# Patient Record
Sex: Female | Born: 1950 | Race: White | Hispanic: No | Marital: Married | Smoking: Never smoker
Health system: Southern US, Community
[De-identification: ages and names within clinical notes are randomized; demographics above are authoritative.]

## PROBLEM LIST (undated history)

## (undated) DIAGNOSIS — I10 Essential (primary) hypertension: Secondary | ICD-10-CM

## (undated) DIAGNOSIS — I499 Cardiac arrhythmia, unspecified: Secondary | ICD-10-CM

## (undated) DIAGNOSIS — E785 Hyperlipidemia, unspecified: Secondary | ICD-10-CM

## (undated) DIAGNOSIS — E663 Overweight: Secondary | ICD-10-CM

## (undated) DIAGNOSIS — Z87442 Personal history of urinary calculi: Secondary | ICD-10-CM

## (undated) DIAGNOSIS — B009 Herpesviral infection, unspecified: Secondary | ICD-10-CM

## (undated) DIAGNOSIS — E039 Hypothyroidism, unspecified: Secondary | ICD-10-CM

## (undated) DIAGNOSIS — N61 Mastitis without abscess: Secondary | ICD-10-CM

## (undated) DIAGNOSIS — F329 Major depressive disorder, single episode, unspecified: Secondary | ICD-10-CM

## (undated) DIAGNOSIS — J309 Allergic rhinitis, unspecified: Secondary | ICD-10-CM

## (undated) DIAGNOSIS — K759 Inflammatory liver disease, unspecified: Secondary | ICD-10-CM

## (undated) DIAGNOSIS — K219 Gastro-esophageal reflux disease without esophagitis: Secondary | ICD-10-CM

## (undated) HISTORY — PX: DILATION AND CURETTAGE OF UTERUS: SHX78

## (undated) HISTORY — PX: CARPAL TUNNEL RELEASE: SHX101

## (undated) HISTORY — PX: JOINT REPLACEMENT: SHX530

## (undated) HISTORY — PX: TUBAL LIGATION: SHX77

## (undated) HISTORY — DX: Herpesviral infection, unspecified: B00.9

## (undated) HISTORY — PX: TONSILLECTOMY: SUR1361

## (undated) HISTORY — DX: Major depressive disorder, single episode, unspecified: F32.9

## (undated) HISTORY — DX: Essential (primary) hypertension: I10

## (undated) HISTORY — DX: Overweight: E66.3

## (undated) HISTORY — DX: Allergic rhinitis, unspecified: J30.9

## (undated) HISTORY — DX: Mastitis without abscess: N61.0

## (undated) HISTORY — DX: Hyperlipidemia, unspecified: E78.5

---

## 1999-04-11 ENCOUNTER — Other Ambulatory Visit: Admission: RE | Admit: 1999-04-11 | Discharge: 1999-04-11 | Payer: Self-pay | Admitting: Family Medicine

## 2000-05-07 ENCOUNTER — Other Ambulatory Visit: Admission: RE | Admit: 2000-05-07 | Discharge: 2000-05-07 | Payer: Self-pay | Admitting: Family Medicine

## 2001-04-25 ENCOUNTER — Other Ambulatory Visit: Admission: RE | Admit: 2001-04-25 | Discharge: 2001-04-25 | Payer: Self-pay | Admitting: Family Medicine

## 2001-08-06 ENCOUNTER — Other Ambulatory Visit: Admission: RE | Admit: 2001-08-06 | Discharge: 2001-08-06 | Payer: Self-pay | Admitting: Family Medicine

## 2001-09-05 ENCOUNTER — Other Ambulatory Visit: Admission: RE | Admit: 2001-09-05 | Discharge: 2001-09-05 | Payer: Self-pay | Admitting: Obstetrics and Gynecology

## 2002-07-25 ENCOUNTER — Other Ambulatory Visit: Admission: RE | Admit: 2002-07-25 | Discharge: 2002-07-25 | Payer: Self-pay | Admitting: Family Medicine

## 2003-08-14 ENCOUNTER — Other Ambulatory Visit: Admission: RE | Admit: 2003-08-14 | Discharge: 2003-08-14 | Payer: Self-pay | Admitting: Family Medicine

## 2003-12-03 ENCOUNTER — Ambulatory Visit (HOSPITAL_COMMUNITY): Admission: RE | Admit: 2003-12-03 | Discharge: 2003-12-03 | Payer: Self-pay | Admitting: Specialist

## 2004-05-13 ENCOUNTER — Emergency Department (HOSPITAL_COMMUNITY): Admission: EM | Admit: 2004-05-13 | Discharge: 2004-05-13 | Payer: Self-pay | Admitting: Family Medicine

## 2004-08-02 ENCOUNTER — Ambulatory Visit: Payer: Self-pay | Admitting: Family Medicine

## 2004-08-09 ENCOUNTER — Other Ambulatory Visit: Admission: RE | Admit: 2004-08-09 | Discharge: 2004-08-09 | Payer: Self-pay | Admitting: Family Medicine

## 2004-08-09 ENCOUNTER — Ambulatory Visit: Payer: Self-pay | Admitting: Family Medicine

## 2004-11-02 ENCOUNTER — Ambulatory Visit: Payer: Self-pay | Admitting: Family Medicine

## 2005-01-16 ENCOUNTER — Ambulatory Visit: Payer: Self-pay | Admitting: Family Medicine

## 2005-06-08 ENCOUNTER — Emergency Department (HOSPITAL_COMMUNITY): Admission: EM | Admit: 2005-06-08 | Discharge: 2005-06-08 | Payer: Self-pay | Admitting: Emergency Medicine

## 2005-10-06 ENCOUNTER — Ambulatory Visit: Payer: Self-pay | Admitting: Family Medicine

## 2005-10-24 ENCOUNTER — Encounter: Payer: Self-pay | Admitting: Family Medicine

## 2005-10-24 ENCOUNTER — Ambulatory Visit: Payer: Self-pay | Admitting: Family Medicine

## 2005-10-24 ENCOUNTER — Other Ambulatory Visit: Admission: RE | Admit: 2005-10-24 | Discharge: 2005-10-24 | Payer: Self-pay | Admitting: Family Medicine

## 2005-12-28 ENCOUNTER — Ambulatory Visit (HOSPITAL_BASED_OUTPATIENT_CLINIC_OR_DEPARTMENT_OTHER): Admission: RE | Admit: 2005-12-28 | Discharge: 2005-12-28 | Payer: Self-pay | Admitting: Specialist

## 2006-03-09 ENCOUNTER — Ambulatory Visit: Payer: Self-pay | Admitting: Family Medicine

## 2006-05-28 ENCOUNTER — Emergency Department (HOSPITAL_COMMUNITY): Admission: EM | Admit: 2006-05-28 | Discharge: 2006-05-28 | Payer: Self-pay | Admitting: Emergency Medicine

## 2006-06-08 ENCOUNTER — Emergency Department (HOSPITAL_COMMUNITY): Admission: EM | Admit: 2006-06-08 | Discharge: 2006-06-08 | Payer: Self-pay | Admitting: Family Medicine

## 2006-07-06 ENCOUNTER — Ambulatory Visit: Payer: Self-pay | Admitting: Family Medicine

## 2006-08-06 ENCOUNTER — Ambulatory Visit: Payer: Self-pay | Admitting: Family Medicine

## 2006-08-27 ENCOUNTER — Encounter: Payer: Self-pay | Admitting: Family Medicine

## 2006-08-28 DIAGNOSIS — I10 Essential (primary) hypertension: Secondary | ICD-10-CM | POA: Insufficient documentation

## 2006-08-28 DIAGNOSIS — F329 Major depressive disorder, single episode, unspecified: Secondary | ICD-10-CM | POA: Insufficient documentation

## 2006-08-28 DIAGNOSIS — F3289 Other specified depressive episodes: Secondary | ICD-10-CM

## 2006-08-28 HISTORY — DX: Other specified depressive episodes: F32.89

## 2006-08-28 HISTORY — DX: Major depressive disorder, single episode, unspecified: F32.9

## 2006-08-28 HISTORY — DX: Essential (primary) hypertension: I10

## 2006-09-01 ENCOUNTER — Ambulatory Visit: Payer: Self-pay | Admitting: Internal Medicine

## 2006-09-03 ENCOUNTER — Ambulatory Visit: Payer: Self-pay | Admitting: Family Medicine

## 2006-09-03 DIAGNOSIS — N10 Acute tubulo-interstitial nephritis: Secondary | ICD-10-CM | POA: Insufficient documentation

## 2006-12-03 ENCOUNTER — Telehealth: Payer: Self-pay | Admitting: Family Medicine

## 2007-02-27 ENCOUNTER — Telehealth: Payer: Self-pay | Admitting: Family Medicine

## 2007-03-05 ENCOUNTER — Encounter: Payer: Self-pay | Admitting: Family Medicine

## 2007-03-19 ENCOUNTER — Telehealth: Payer: Self-pay | Admitting: Family Medicine

## 2007-04-25 ENCOUNTER — Telehealth: Payer: Self-pay | Admitting: Family Medicine

## 2007-05-21 ENCOUNTER — Telehealth: Payer: Self-pay | Admitting: Family Medicine

## 2007-08-22 ENCOUNTER — Telehealth (INDEPENDENT_AMBULATORY_CARE_PROVIDER_SITE_OTHER): Payer: Self-pay | Admitting: *Deleted

## 2007-09-04 ENCOUNTER — Encounter: Payer: Self-pay | Admitting: Family Medicine

## 2007-11-13 ENCOUNTER — Telehealth: Payer: Self-pay | Admitting: *Deleted

## 2007-12-02 ENCOUNTER — Telehealth: Payer: Self-pay | Admitting: Family Medicine

## 2007-12-16 ENCOUNTER — Ambulatory Visit: Payer: Self-pay | Admitting: Family Medicine

## 2007-12-16 LAB — CONVERTED CEMR LAB
ALT: 26 units/L (ref 0–35)
AST: 24 units/L (ref 0–37)
Albumin: 3.9 g/dL (ref 3.5–5.2)
Alkaline Phosphatase: 104 units/L (ref 39–117)
BUN: 11 mg/dL (ref 6–23)
Basophils Absolute: 0.2 10*3/uL — ABNORMAL HIGH (ref 0.0–0.1)
Basophils Relative: 2.6 % (ref 0.0–3.0)
Bilirubin Urine: NEGATIVE
Bilirubin, Direct: 0.1 mg/dL (ref 0.0–0.3)
Blood in Urine, dipstick: NEGATIVE
CO2: 34 meq/L — ABNORMAL HIGH (ref 19–32)
Calcium: 9.6 mg/dL (ref 8.4–10.5)
Chloride: 103 meq/L (ref 96–112)
Cholesterol: 167 mg/dL (ref 0–200)
Creatinine, Ser: 1 mg/dL (ref 0.4–1.2)
Eosinophils Absolute: 0.2 10*3/uL (ref 0.0–0.7)
Eosinophils Relative: 2.9 % (ref 0.0–5.0)
GFR calc Af Amer: 73 mL/min
GFR calc non Af Amer: 61 mL/min
Glucose, Bld: 99 mg/dL (ref 70–99)
Glucose, Urine, Semiquant: NEGATIVE
HCT: 38.3 % (ref 36.0–46.0)
HDL: 48.4 mg/dL (ref >39.0)
Hemoglobin: 13.2 g/dL (ref 12.0–15.0)
Ketones, urine, test strip: NEGATIVE
LDL Cholesterol: 90 mg/dL (ref 0–99)
Lymphocytes Relative: 25.3 % (ref 12.0–46.0)
MCHC: 34.4 g/dL (ref 30.0–36.0)
MCV: 87.1 fL (ref 78.0–100.0)
Monocytes Absolute: 0.4 10*3/uL (ref 0.1–1.0)
Monocytes Relative: 6 % (ref 3.0–12.0)
Neutro Abs: 4.2 10*3/uL (ref 1.4–7.7)
Neutrophils Relative %: 63.2 % (ref 43.0–77.0)
Nitrite: NEGATIVE
Platelets: 275 10*3/uL (ref 150–400)
Potassium: 3.6 meq/L (ref 3.5–5.1)
Protein, U semiquant: NEGATIVE
RBC: 4.4 M/uL (ref 3.87–5.11)
RDW: 13.5 % (ref 11.5–14.6)
Sodium: 146 meq/L — ABNORMAL HIGH (ref 135–145)
Specific Gravity, Urine: 1.015
TSH: 1.41 microintl units/mL (ref 0.35–5.50)
Total Bilirubin: 0.8 mg/dL (ref 0.3–1.2)
Total CHOL/HDL Ratio: 3.5
Total Protein: 7.2 g/dL (ref 6.0–8.3)
Triglycerides: 143 mg/dL (ref 0–149)
Urobilinogen, UA: 0.2
VLDL: 29 mg/dL (ref 0–40)
WBC Urine, dipstick: NEGATIVE
WBC: 6.7 10*3/uL (ref 4.5–10.5)
pH: 8.5

## 2007-12-23 ENCOUNTER — Other Ambulatory Visit: Admission: RE | Admit: 2007-12-23 | Discharge: 2007-12-23 | Payer: Self-pay | Admitting: Family Medicine

## 2007-12-23 ENCOUNTER — Encounter: Payer: Self-pay | Admitting: Family Medicine

## 2007-12-23 ENCOUNTER — Ambulatory Visit: Payer: Self-pay | Admitting: Family Medicine

## 2007-12-23 DIAGNOSIS — E782 Mixed hyperlipidemia: Secondary | ICD-10-CM | POA: Insufficient documentation

## 2007-12-23 DIAGNOSIS — E785 Hyperlipidemia, unspecified: Secondary | ICD-10-CM

## 2007-12-23 HISTORY — DX: Hyperlipidemia, unspecified: E78.5

## 2008-01-02 ENCOUNTER — Ambulatory Visit: Payer: Self-pay | Admitting: Family Medicine

## 2008-01-20 ENCOUNTER — Ambulatory Visit: Payer: Self-pay | Admitting: Family Medicine

## 2008-02-17 ENCOUNTER — Ambulatory Visit: Payer: Self-pay | Admitting: Family Medicine

## 2008-08-01 ENCOUNTER — Emergency Department (HOSPITAL_COMMUNITY): Admission: EM | Admit: 2008-08-01 | Discharge: 2008-08-01 | Payer: Self-pay | Admitting: Emergency Medicine

## 2008-11-11 ENCOUNTER — Encounter: Payer: Self-pay | Admitting: Family Medicine

## 2008-12-29 ENCOUNTER — Telehealth: Payer: Self-pay | Admitting: Family Medicine

## 2009-01-05 ENCOUNTER — Ambulatory Visit: Payer: Self-pay | Admitting: Family Medicine

## 2009-01-05 LAB — CONVERTED CEMR LAB
ALT: 26 units/L (ref 0–35)
AST: 25 units/L (ref 0–37)
Albumin: 3.9 g/dL (ref 3.5–5.2)
Alkaline Phosphatase: 78 units/L (ref 39–117)
BUN: 9 mg/dL (ref 6–23)
Basophils Absolute: 0 10*3/uL (ref 0.0–0.1)
Basophils Relative: 0.6 % (ref 0.0–3.0)
Bilirubin Urine: NEGATIVE
Bilirubin, Direct: 0.1 mg/dL (ref 0.0–0.3)
CO2: 31 meq/L (ref 19–32)
Calcium: 9 mg/dL (ref 8.4–10.5)
Chloride: 105 meq/L (ref 96–112)
Cholesterol: 162 mg/dL (ref 0–200)
Creatinine, Ser: 0.9 mg/dL (ref 0.4–1.2)
Eosinophils Absolute: 0.2 10*3/uL (ref 0.0–0.7)
Eosinophils Relative: 2.5 % (ref 0.0–5.0)
GFR calc non Af Amer: 68.08 mL/min (ref >60)
Glucose, Bld: 93 mg/dL (ref 70–99)
Glucose, Urine, Semiquant: NEGATIVE
HCT: 40 % (ref 36.0–46.0)
HDL: 48.2 mg/dL (ref >39.00)
Hemoglobin: 13.4 g/dL (ref 12.0–15.0)
Ketones, urine, test strip: NEGATIVE
LDL Cholesterol: 90 mg/dL (ref 0–99)
Lymphocytes Relative: 27.9 % (ref 12.0–46.0)
Lymphs Abs: 1.8 10*3/uL (ref 0.7–4.0)
MCHC: 33.5 g/dL (ref 30.0–36.0)
MCV: 89.1 fL (ref 78.0–100.0)
Monocytes Absolute: 0.4 10*3/uL (ref 0.1–1.0)
Monocytes Relative: 6.5 % (ref 3.0–12.0)
Neutro Abs: 4.2 10*3/uL (ref 1.4–7.7)
Neutrophils Relative %: 62.5 % (ref 43.0–77.0)
Nitrite: NEGATIVE
Platelets: 243 10*3/uL (ref 150.0–400.0)
Potassium: 3.3 meq/L — ABNORMAL LOW (ref 3.5–5.1)
Protein, U semiquant: NEGATIVE
RBC: 4.49 M/uL (ref 3.87–5.11)
RDW: 13.6 % (ref 11.5–14.6)
Sodium: 143 meq/L (ref 135–145)
Specific Gravity, Urine: 1.015
TSH: 1.26 microintl units/mL (ref 0.35–5.50)
Total Bilirubin: 0.8 mg/dL (ref 0.3–1.2)
Total CHOL/HDL Ratio: 3
Total Protein: 6.9 g/dL (ref 6.0–8.3)
Triglycerides: 118 mg/dL (ref 0.0–149.0)
Urobilinogen, UA: 0.2
VLDL: 23.6 mg/dL (ref 0.0–40.0)
WBC Urine, dipstick: NEGATIVE
WBC: 6.6 10*3/uL (ref 4.5–10.5)
pH: 7

## 2009-01-12 ENCOUNTER — Other Ambulatory Visit: Admission: RE | Admit: 2009-01-12 | Discharge: 2009-01-12 | Payer: Self-pay | Admitting: Family Medicine

## 2009-01-12 ENCOUNTER — Ambulatory Visit: Payer: Self-pay | Admitting: Family Medicine

## 2009-01-12 DIAGNOSIS — M545 Low back pain, unspecified: Secondary | ICD-10-CM | POA: Insufficient documentation

## 2009-01-12 DIAGNOSIS — E663 Overweight: Secondary | ICD-10-CM | POA: Insufficient documentation

## 2009-01-12 HISTORY — DX: Overweight: E66.3

## 2009-01-20 ENCOUNTER — Encounter (INDEPENDENT_AMBULATORY_CARE_PROVIDER_SITE_OTHER): Payer: Self-pay | Admitting: *Deleted

## 2009-01-22 ENCOUNTER — Ambulatory Visit: Payer: Self-pay | Admitting: Gastroenterology

## 2009-02-01 LAB — HM COLONOSCOPY

## 2009-02-02 ENCOUNTER — Ambulatory Visit: Payer: Self-pay | Admitting: Gastroenterology

## 2009-02-02 LAB — HM MAMMOGRAPHY: HM Mammogram: NORMAL

## 2009-06-09 ENCOUNTER — Telehealth: Payer: Self-pay | Admitting: Family Medicine

## 2009-06-21 ENCOUNTER — Ambulatory Visit: Payer: Self-pay | Admitting: Family Medicine

## 2009-06-21 DIAGNOSIS — R42 Dizziness and giddiness: Secondary | ICD-10-CM | POA: Insufficient documentation

## 2009-07-01 ENCOUNTER — Ambulatory Visit: Payer: Self-pay | Admitting: Family Medicine

## 2009-07-01 DIAGNOSIS — B009 Herpesviral infection, unspecified: Secondary | ICD-10-CM

## 2009-07-01 HISTORY — DX: Herpesviral infection, unspecified: B00.9

## 2009-09-20 ENCOUNTER — Telehealth: Payer: Self-pay | Admitting: Family Medicine

## 2009-10-04 DIAGNOSIS — N61 Mastitis without abscess: Secondary | ICD-10-CM | POA: Insufficient documentation

## 2009-10-04 HISTORY — DX: Mastitis without abscess: N61.0

## 2009-10-07 ENCOUNTER — Ambulatory Visit: Payer: Self-pay | Admitting: Family Medicine

## 2009-11-18 ENCOUNTER — Encounter: Payer: Self-pay | Admitting: Family Medicine

## 2010-01-10 ENCOUNTER — Ambulatory Visit: Payer: Self-pay | Admitting: Family Medicine

## 2010-01-10 LAB — CONVERTED CEMR LAB
ALT: 33 units/L (ref 0–35)
AST: 27 units/L (ref 0–37)
Albumin: 4.2 g/dL (ref 3.5–5.2)
Alkaline Phosphatase: 80 units/L (ref 39–117)
BUN: 15 mg/dL (ref 6–23)
Basophils Absolute: 0 10*3/uL (ref 0.0–0.1)
Basophils Relative: 0.7 % (ref 0.0–3.0)
Bilirubin Urine: NEGATIVE
Bilirubin, Direct: 0.1 mg/dL (ref 0.0–0.3)
CO2: 32 meq/L (ref 19–32)
Calcium: 9.8 mg/dL (ref 8.4–10.5)
Chloride: 103 meq/L (ref 96–112)
Cholesterol: 177 mg/dL (ref 0–200)
Creatinine, Ser: 1 mg/dL (ref 0.4–1.2)
Eosinophils Absolute: 0.2 10*3/uL (ref 0.0–0.7)
Eosinophils Relative: 3.1 % (ref 0.0–5.0)
GFR calc non Af Amer: 58.72 mL/min (ref >60)
Glucose, Bld: 103 mg/dL — ABNORMAL HIGH (ref 70–99)
Glucose, Urine, Semiquant: NEGATIVE
HCT: 39.2 % (ref 36.0–46.0)
HDL: 55.1 mg/dL (ref >39.00)
Hemoglobin: 13.2 g/dL (ref 12.0–15.0)
LDL Cholesterol: 97 mg/dL (ref 0–99)
Lymphocytes Relative: 32.8 % (ref 12.0–46.0)
Lymphs Abs: 2.2 10*3/uL (ref 0.7–4.0)
MCHC: 33.8 g/dL (ref 30.0–36.0)
MCV: 88.4 fL (ref 78.0–100.0)
Monocytes Absolute: 0.5 10*3/uL (ref 0.1–1.0)
Monocytes Relative: 6.8 % (ref 3.0–12.0)
Neutro Abs: 3.9 10*3/uL (ref 1.4–7.7)
Neutrophils Relative %: 56.6 % (ref 43.0–77.0)
Nitrite: NEGATIVE
Platelets: 255 10*3/uL (ref 150.0–400.0)
Potassium: 4.1 meq/L (ref 3.5–5.1)
RBC: 4.43 M/uL (ref 3.87–5.11)
RDW: 15 % — ABNORMAL HIGH (ref 11.5–14.6)
Sodium: 143 meq/L (ref 135–145)
Specific Gravity, Urine: 1.02
TSH: 1.91 microintl units/mL (ref 0.35–5.50)
Total Bilirubin: 0.3 mg/dL (ref 0.3–1.2)
Total CHOL/HDL Ratio: 3
Total Protein: 7.1 g/dL (ref 6.0–8.3)
Triglycerides: 123 mg/dL (ref 0.0–149.0)
Urobilinogen, UA: 0.2
VLDL: 24.6 mg/dL (ref 0.0–40.0)
WBC: 6.9 10*3/uL (ref 4.5–10.5)

## 2010-01-17 ENCOUNTER — Encounter: Payer: Self-pay | Admitting: Family Medicine

## 2010-01-17 ENCOUNTER — Other Ambulatory Visit
Admission: RE | Admit: 2010-01-17 | Discharge: 2010-01-17 | Payer: Self-pay | Source: Home / Self Care | Admitting: Family Medicine

## 2010-01-17 ENCOUNTER — Ambulatory Visit: Payer: Self-pay | Admitting: Family Medicine

## 2010-01-17 LAB — CONVERTED CEMR LAB: Pap Smear: NEGATIVE

## 2010-01-25 ENCOUNTER — Ambulatory Visit: Payer: Self-pay | Admitting: Internal Medicine

## 2010-01-25 ENCOUNTER — Encounter: Payer: Self-pay | Admitting: Family Medicine

## 2010-03-13 ENCOUNTER — Emergency Department (HOSPITAL_COMMUNITY)
Admission: EM | Admit: 2010-03-13 | Discharge: 2010-03-13 | Payer: Self-pay | Source: Home / Self Care | Admitting: Emergency Medicine

## 2010-03-13 LAB — POCT I-STAT, CHEM 8
BUN: 11 mg/dL (ref 6–23)
Calcium, Ion: 1.15 mmol/L (ref 1.12–1.32)
Chloride: 102 mEq/L (ref 96–112)
Creatinine, Ser: 1.2 mg/dL (ref 0.4–1.2)
Glucose, Bld: 110 mg/dL — ABNORMAL HIGH (ref 70–99)
HCT: 45 % (ref 36.0–46.0)
Hemoglobin: 15.3 g/dL — ABNORMAL HIGH (ref 12.0–15.0)
Potassium: 3.9 mEq/L (ref 3.5–5.1)
Sodium: 143 mEq/L (ref 135–145)
TCO2: 32 mmol/L (ref 0–100)

## 2010-03-14 ENCOUNTER — Telehealth: Payer: Self-pay | Admitting: Family Medicine

## 2010-03-14 ENCOUNTER — Other Ambulatory Visit: Payer: Self-pay | Admitting: Family Medicine

## 2010-03-14 ENCOUNTER — Ambulatory Visit
Admission: RE | Admit: 2010-03-14 | Discharge: 2010-03-14 | Payer: Self-pay | Source: Home / Self Care | Attending: Family Medicine | Admitting: Family Medicine

## 2010-03-14 LAB — BASIC METABOLIC PANEL
BUN: 16 mg/dL (ref 6–23)
CO2: 33 mEq/L — ABNORMAL HIGH (ref 19–32)
Calcium: 9.5 mg/dL (ref 8.4–10.5)
Chloride: 99 mEq/L (ref 96–112)
Creatinine, Ser: 1 mg/dL (ref 0.4–1.2)
GFR: 58.03 mL/min — ABNORMAL LOW (ref >60.00)
Glucose, Bld: 94 mg/dL (ref 70–99)
Potassium: 4.3 mEq/L (ref 3.5–5.1)
Sodium: 140 mEq/L (ref 135–145)

## 2010-03-15 NOTE — Letter (Signed)
Summary: Sierra Banks Instructions  [facility]  [address] [place], [place] [address]   Phone: [phone]  Fax: [phone]       Sierra Banks    [birth year]    MRN: [id number]        Procedure Day Dorna Bloom:  Jake Shark  02/02/09     Arrival Time:  9:30AM     Procedure Time:  10:30AM     Location of Procedure:                    Juliann Pares _  [facility] (4th Floor)   PREPARATION FOR COLONOSCOPY WITH MOVIPREP   Starting 5 days prior to your procedure12/16/10 do not eat nuts, seeds, popcorn, corn, beans, peas,  salads, or any raw vegetables.  Do not take any fiber supplements (e.g. Metamucil, Citrucel, and Benefiber).  THE DAY BEFORE YOUR PROCEDURE         DATE: 12/61/10  DAY: MONDAY  1.  Drink clear liquids the entire day-NO SOLID FOOD  2.  Do not drink anything colored red or purple.  Avoid juices with pulp.  No orange juice.  3.  Drink at least 64 oz. (8 glasses) of fluid/clear liquids during the day to prevent dehydration and help the prep work efficiently.  CLEAR LIQUIDS INCLUDE: Water Jello Ice Popsicles Tea (sugar ok, no milk/cream) Powdered fruit flavored drinks Coffee (sugar ok, no milk/cream) Gatorade Juice: apple, white grape, white cranberry  Lemonade Clear bullion, consomm, broth Carbonated beverages (any kind) Strained chicken noodle soup Hard Candy                             4.  In the morning, mix first dose of MoviPrep solution:    Empty 1 Pouch A and 1 Pouch B into the disposable container    Add lukewarm drinking water to the top line of the container. Mix to dissolve    Refrigerate (mixed solution should be used within 24 hrs)  5.  Begin drinking the prep at 5:00 p.m. The MoviPrep container is divided by 4 marks.   Every 15 minutes drink the solution down to the next mark (approximately 8 oz) until the full liter is complete.   6.  Follow completed prep with 16 oz of clear liquid of your choice (Nothing red or purple).   Continue to drink clear liquids until bedtime.  7.  Before going to bed, mix second dose of MoviPrep solution:    Empty 1 Pouch A and 1 Pouch B into the disposable container    Add lukewarm drinking water to the top line of the container. Mix to dissolve    Refrigerate  THE DAY OF YOUR PROCEDURE      DATE:12/61/10 DAY: TUESDAY  Beginning at 5:30a.m. (5 hours before procedure):         1. Every 15 minutes, drink the solution down to the next mark (approx 8 oz) until the full liter is complete.  2. Follow completed prep with 16 oz. of clear liquid of your choice.    3. You may drink clear liquids until 8:30AM (2 HOURS BEFORE PROCEDURE).   MEDICATION INSTRUCTIONS  Unless otherwise instructed, you should take regular prescription medications with a small sip of water   as early as possible the morning of your procedure.  Additional medication instructions:  Do not take [place] the day of the procedure.  OTHER INSTRUCTIONS  You will need a responsible adult at least 61 years of age to accompany you and drive you home.   This person must remain in the waiting room during your procedure.  Wear loose fitting clothing that is easily removed.  Leave jewelry and other valuables at home.  However, you may wish to bring a book to read or  an iPod/MP3 player to listen to music as you wait for your procedure to start.  Remove all body piercing jewelry and leave at home.  Total time from sign-in until discharge is approximately 2-3 hours.  You should go home directly after your procedure and rest.  You can resume normal activities the  day after your procedure.  The day of your procedure you should not:   Drive   Make legal decisions   Operate machinery   Drink alcohol   Return to work  You will receive specific instructions about eating, activities and medications before you leave.    The above instructions have been reviewed and explained to me by   Ezra Sites RN  January 23, 2060 2:20 PM     I fully understand and can verbalize these instructions _____________________________ Date _________

## 2010-03-15 NOTE — Progress Notes (Signed)
Summary: Effexor   Phone Note Call from Patient   Caller: Patient Call For: Dr. Tawanna Cooler Summary of Call: Pt. would like to increase her Effexor 150 mg. if you would agree. [facility] [address] [phone] Initial call taken by: Lynann Beaver CMA,  March 19, 2007 1:33 PM  Follow-up for Phone Call        please call in the generic, Effexor, 50 mg one q.a.m., which she will take along with her Effexor 150XR q.a.m. call in a hundred tablets.  Refill is x 3.  [facility] [address] Follow-up by: Roderick Pee MD,  March 19, 2007 2:48 PM  Additional Follow-up for Phone Call Additional follow up Details #1::        Phone Call Completed, Rx Called In, Provider Notified Additional Follow-up by: Arcola Jansky, RN,  March 19, 2007 5:37 PM

## 2010-03-15 NOTE — Progress Notes (Signed)
Summary: effexor refill  Phone Note Call from Patient   Summary of Call: patient is requesting a refill of effexor last office visit was 1/10.  is this okay to fill? Initial call taken by: Kern Reap CMA Duncan Dull),  December 29, 2008 9:47 AM  Follow-up for Phone Call        ok.......dispense enough until next physical Follow-up by: Roderick Pee MD,  December 29, 2008 10:07 AM  Additional Follow-up for Phone Call Additional follow up Details #1::        Rx Called In no refills.  left message on machine for patient to schedule a cpx. Additional Follow-up by: Kern Reap CMA Duncan Dull),  December 29, 2008 4:51 PM

## 2010-03-15 NOTE — Miscellaneous (Signed)
  Medications Added LISINOPRIL 10 MG TABS (LISINOPRIL) 1 once daily       Clinical Lists Changes  Medications: Changed medication from LISINOPRIL 10 MG TABS (LISINOPRIL) to LISINOPRIL 10 MG TABS (LISINOPRIL) 1 once daily

## 2010-03-15 NOTE — Miscellaneous (Signed)
Summary: mammogram update   Clinical Lists Changes  Observations: Added new observation of MAMMOGRAM: normal (11/17/2009 8:35)      Preventive Care Screening  Mammogram:    Date:  11/17/2009    Results:  normal

## 2010-03-15 NOTE — Assessment & Plan Note (Signed)
Summary: DIZZINESS, NAUSEA (VERTIGO?) // RS   Vital Signs:  Patient profile:   60 year old female Menstrual status:  postmenopausal Weight:      222 pounds Temp:     98.4 degrees F oral BP sitting:   102 / 70  (right arm) CC: Nausea/Dizzy/Vertigo   CC:  Nausea/Dizzy/Vertigo.  History of Present Illness: Sierra Banks is a 60 year old, married female, nonsmoker, who comes in today for evaluation of vertigo.  She states last Friday.  She went to stand up and the room started to spin.  Since that, time it's been constant, although not very intense.  She had vomiting x 1 yesterday, when she stood up, it was so severe it made her nauseated and she threw up.  She said no hearing loss.  Neurologic review of systems negative  Allergies: No Known Drug Allergies  Past History:  Past medical, surgical, family and social histories (including risk factors) reviewed for relevance to current acute and chronic problems.  Past Medical History: Reviewed history from 12/23/2007 and no changes required. DUB Depression Hypertension Hyperlipidemia  Past Surgical History: Reviewed history from 08/28/2006 and no changes required. T&A BTL CB x 2  Family History: Reviewed history and no changes required.  Social History: Reviewed history from 08/28/2006 and no changes required. Married Never Smoked Alcohol use-no Drug use-no  Review of Systems      See HPI  Physical Exam  General:  Well-developed,well-nourished,in no acute distress; alert,appropriate and cooperative throughout examination Head:  Normocephalic and atraumatic without obvious abnormalities. No apparent alopecia or balding. Eyes:  No corneal or conjunctival inflammation noted. EOMI. Perrla. Funduscopic exam benign, without hemorrhages, exudates or papilledema. Vision grossly normal. Ears:  External ear exam shows no significant lesions or deformities.  Otoscopic examination reveals clear canals, tympanic membranes are intact  bilaterally without bulging, retraction, inflammation or discharge. Hearing is grossly normal bilaterally. Nose:  External nasal examination shows no deformity or inflammation. Nasal mucosa are pink and moist without lesions or exudates. Mouth:  Oral mucosa and oropharynx without lesions or exudates.  Teeth in good repair. Neurologic:  No cranial nerve deficits noted. Station and gait are normal. Plantar reflexes are down-going bilaterally. DTRs are symmetrical throughout. Sensory, motor and coordinative functions appear intact.   Problems:  Medical Problems Added: 1)  Dx of Vertigo  (ICD-780.4)  Impression & Recommendations:  Problem # 1:  VERTIGO (ICD-780.4) Assessment New  Complete Medication List: 1)  Effexor Xr 150 Mg Cp24 (Venlafaxine hcl) .... 2 q am 2)  Atenolol 100 Mg Tabs (Atenolol) .Marland Kitchen.. 1 tab qam 3)  Furosemide 40 Mg Tabs (Furosemide) .... Take 1 tablet by mouth every morning ; cpx when due 4)  Zocor 40 Mg Tabs (Simvastatin) .Marland Kitchen.. 1 tab @ bedtime 5)  Lisinopril 40 Mg Tabs (Lisinopril) .... 2 by mouth qam 6)  Flexeril 10 Mg Tabs (Cyclobenzaprine hcl) .Marland Kitchen.. 1 tab @ bedtime  Patient Instructions: 1)  if in two weeks.  The vertigo persists call we would be to setup for an ENT evaluation

## 2010-03-15 NOTE — Assessment & Plan Note (Signed)
Summary: 1 month rov/njr   Vital Signs:  Patient Profile:   60 Years Old Female Height:     64 inches Weight:      214 pounds Temp:     98.6 degrees F oral BP sitting:   150 / 80  (left arm) Cuff size:   regular  Vitals Entered By: Kern Reap CMA (February 17, 2008 9:14 AM)                 Chief Complaint:  follow up with blood pressure.  History of Present Illness: Sierra Banks is a 60 year old female, who comes in today for follow-up of hypertension.  She is on 100 mg a day, atenolol, 60 mg of Lasix, and 60 mg of lisinopril.  Blood pressure is still elevated systolic running around 150.  Today I got 150/84, asymptomatic as far as side effects    Prior Medication List:  EFFEXOR XR 150 MG  CP24 (VENLAFAXINE HCL) 2 q am ATENOLOL 100 MG  TABS (ATENOLOL) 1 tab qam FUROSEMIDE 40 MG  TABS (FUROSEMIDE) Take 1 tablet by mouth every morning ; cpx when due ZOCOR 40 MG TABS (SIMVASTATIN) 1 tab @ bedtime LISINOPRIL 40 MG TABS (LISINOPRIL) 1 &1/2 qam FUROSEMIDE 20 MG TABS (FUROSEMIDE) 1 @noon    Current Allergies: No known allergies   Past Medical History:    Reviewed history from 12/23/2007 and no changes required:       DUB       Depression       Hypertension       Hyperlipidemia   Social History:    Reviewed history from 08/28/2006 and no changes required:       Married       Never Smoked       Alcohol use-no       Drug use-no    Review of Systems      See HPI   Physical Exam  General:     Well-developed,well-nourished,in no acute distress; alert,appropriate and cooperative throughout examination Heart:     150/84    Impression & Recommendations:  Problem # 1:  HYPERTENSION (ICD-401.9) Assessment: Unchanged  Her updated medication list for this problem includes:    Atenolol 100 Mg Tabs (Atenolol) .Marland Kitchen... 1 tab qam    Furosemide 40 Mg Tabs (Furosemide) .Marland Kitchen... Take 1 tablet by mouth every morning ; cpx when due    Lisinopril 40 Mg Tabs (Lisinopril) .Marland Kitchen... 2  by mouth qam    Furosemide 20 Mg Tabs (Furosemide) .Marland Kitchen... 1 @noon    Complete Medication List: 1)  Effexor Xr 150 Mg Cp24 (Venlafaxine hcl) .... 2 q am 2)  Atenolol 100 Mg Tabs (Atenolol) .Marland Kitchen.. 1 tab qam 3)  Furosemide 40 Mg Tabs (Furosemide) .... Take 1 tablet by mouth every morning ; cpx when due 4)  Zocor 40 Mg Tabs (Simvastatin) .Marland Kitchen.. 1 tab @ bedtime 5)  Lisinopril 40 Mg Tabs (Lisinopril) .... 2 by mouth qam 6)  Furosemide 20 Mg Tabs (Furosemide) .Marland Kitchen.. 1 @noon    Patient Instructions: 1)  increase the lisinopril to 80 mg daily.  Check a morning blood pressure daily.  If after 4 weeks.  Her blood pressure is 135/85 or less.  Then we are at goal continued that medication.  If, however, it's not returned for follow-up   Prescriptions: LISINOPRIL 40 MG TABS (LISINOPRIL) 2 by mouth qam  #200 x 3   Entered and Authorized by:   Roderick Pee MD   Signed by:  Roderick Pee MD on 02/17/2008   Method used:   [facility]        [id number]       [address]       [place], [place]  [address]       Ph: [phone] or [phone]       Fax: [phone]   RxID:   [id number]  ]

## 2010-03-15 NOTE — Progress Notes (Signed)
Summary: lipitor time for cpx  Phone Note Refill Request Message from:  Fax from Pharmacy  Refills Requested: Medication #1:  LIPITOR 20 MG TABS one tab once daily  Method Requested: Electronic Initial call taken by: Kern Reap CMA,  December 02, 2007 12:19 PM      Prescriptions: LIPITOR 20 MG TABS (ATORVASTATIN CALCIUM) one tab once daily  #15 x 0   Entered by:   Kern Reap CMA   Authorized by:   Roderick Pee MD   Signed by:   Kern Reap CMA on 12/02/2007   Method used:   Electronically to        [facility] [address] [phone]* (retail)       [address]       [place], [place]  [address]       Ph: [phone] or [phone]       Fax: [phone]   RxID:   [id number]

## 2010-03-15 NOTE — Progress Notes (Signed)
Summary: wants higher dose of Effexor  LMTCB 05/22/2007  Phone Note Call from Patient   Caller: patient live Call For: Tawanna Cooler Summary of Call: Last month Dr Tawanna Cooler upped her dose on Effexor XR to 180mg .  She still feels this is not quite enough.  One minute she is ok and the next she feels down.  cell [phone] until 4 after 4 home [phone].  [facility] [address]  Initial call taken by: Roselle Locus,  May 21, 2007 2:36 PM  Follow-up for Phone Call        increase Effexor to 300 mg daily.  Return to the office to see me in 3 weeks if not happy with how she feels on this new dose Follow-up by: Roderick Pee MD,  May 21, 2007 5:48 PM  Additional Follow-up for Phone Call Additional follow up Details #1::        [facility] Additional Follow-up by: Lynann Beaver CMA,  May 22, 2007 8:15 AM    Additional Follow-up for Phone Call Additional follow up Details #2::    Patient aware.  Needs updated Rx to [facility] [address]  New/Updated Medications: EFFEXOR XR 150 MG  CP24 (VENLAFAXINE HCL) 2 q am   Prescriptions: EFFEXOR XR 150 MG  CP24 (VENLAFAXINE HCL) 2 q am  #200 x 3   Entered by:   Rudy Jew, RN   Authorized by:   Roderick Pee MD   Signed by:   Rudy Jew, RN on 05/22/2007   Method used:   Electronically sent to ...       [facility] [address] [id number]       [address]       [place], [place]  [address]       Ph: [phone] or [phone]       Fax: [phone]   RxID:   [id number]

## 2010-03-15 NOTE — Procedures (Signed)
Summary: Colonoscopy  Patient: Sierra Banks Note: All result statuses are Final unless otherwise noted.  Tests: (1) Colonoscopy (COL)   COL Colonoscopy           DONE     [facility]     [address]     [place], [place]  [address]           COLONOSCOPY PROCEDURE REPORT           PATIENT:  Sierra Banks  MR#:  [id number]     BIRTHDATE:  23-Jun-1950, 58 yrs. old  GENDER:  female           ENDOSCOPIST:  Judie Petit T. Russella Dar, MD, [facility]     Referred by:  Eugenio Hoes Tawanna Cooler, M.D.           PROCEDURE DATE:  02/02/2009     PROCEDURE:  Colonoscopy [address]     ASA CLASS:  Class II     INDICATIONS:  1) Elevated Risk Screening  2) family history of     colon cancer, maternal uncle and maternal GF with colon cancer.           MEDICATIONS:   Fentanyl 100 mcg IV, Versed 9 mg IV           DESCRIPTION OF PROCEDURE:   After the risks benefits and     alternatives of the procedure were thoroughly explained, informed     consent was obtained.  Digital rectal exam was performed and     revealed no abnormalities.   The LB [id number]-[id number] endoscope     was introduced through the anus and advanced to the cecum, which     was identified by both the appendix and ileocecal valve, limited     by a tortuous colon.    The quality of the prep was good, using     MoviPrep.  The instrument was then slowly withdrawn as the colon     was fully examined.     <<PROCEDUREIMAGES>>           FINDINGS:  Mild diverticulosis was found in the sigmoid colon. A     normal appearing cecum, ileocecal valve, and appendiceal orifice     were identified. The ascending, hepatic flexure, transverse,     splenic flexure, descending, and rectum appeared unremarkable.     Retroflexed views in the rectum revealed no abnormalities.  The     time to cecum =  8  minutes. The scope was then withdrawn (time =     10.25  min) from the patient and the procedure completed.           COMPLICATIONS:  None           ENDOSCOPIC  IMPRESSION:     1) Mild diverticulosis in the sigmoid colon           RECOMMENDATIONS:     1) high fiber diet     2) Repeat Colonoscopy in 5 years.           Venita Lick. Russella Dar, MD, [facility]           n.     eSIGNED:   Venita Lick. Hadyn Azer at 02/02/2009 10:38 AM           Johnnette Barrios, [phone]  Note: An exclamation mark (!) indicates a result that was not dispersed into the flowsheet. Document Creation Date: 02/02/2009 10:39 AM _______________________________________________________________________  (1) Order result status: Final Collection or observation  date-time: 02/02/2009 10:36 Requested date-time:  Receipt date-time:  Reported date-time:  Referring Physician:   Ordering Physician: Claudette Head [id number]) Specimen Source:  Source: Launa Grill Order Number: [id number] Lab site:   Appended Document: Colonoscopy    Clinical Lists Changes  Observations: Added new observation of COLONNXTDUE: 01/2014 (02/02/2009 11:15)

## 2010-03-15 NOTE — Assessment & Plan Note (Signed)
Summary: cpx/mhf   Vital Signs:  Patient Profile:   60 Years Old Female Height:     64 inches Weight:      212 pounds Temp:     98.4 degrees F oral Pulse rate:   70 / minute Pulse rhythm:   regular BP sitting:   160 / 90  (left arm) Cuff size:   regular  Vitals Entered By: Kern Reap CMA (December 23, 2007 2:04 PM)                 Chief Complaint:  CPX.  History of Present Illness: Sierra Banks is a 60 year old female, married, nonsmoker, who comes in today for evaluation of hypertension, hyperlipidemia, and depression.  Her hypertension and managed with atenolol, under milligrams q.a.m., Lasix, 40 mg q.a.m., and lisinopril 10 mg q.a.m.  She's been compliant with her medication and blood pressures running 160/90.  Here the same at home.  Her hyperlipidemia and managed Lipitor 20 nightly.  Was switched to Zocor 40 nightly.  Depressions been managed with Effexor 150 mg long acting two tabs q.a.m.Marland Kitchen  She is doing well.  Will continue that dose.  Last tetanus shot 2000, 5.  She's had her flu shot in 2009.    Current Allergies: No known allergies   Past Medical History:    Reviewed history from 08/28/2006 and no changes required:       DUB       Depression       Hypertension       Hyperlipidemia  Past Surgical History:    Reviewed history from 08/28/2006 and no changes required:       T&A       BTL       CB x 2   Family History:    Reviewed history and no changes required:  Social History:    Reviewed history from 08/28/2006 and no changes required:       Married       Never Smoked       Alcohol use-no       Drug use-no    Review of Systems      See HPI   Physical Exam  General:     Well-developed,well-nourished,in no acute distress; alert,appropriate and cooperative throughout examination Head:     Normocephalic and atraumatic without obvious abnormalities. No apparent alopecia or balding. Eyes:     No corneal or conjunctival inflammation noted.  EOMI. Perrla. Funduscopic exam benign, without hemorrhages, exudates or papilledema. Vision grossly normal. Ears:     External ear exam shows no significant lesions or deformities.  Otoscopic examination reveals clear canals, tympanic membranes are intact bilaterally without bulging, retraction, inflammation or discharge. Hearing is grossly normal bilaterally. Nose:     External nasal examination shows no deformity or inflammation. Nasal mucosa are pink and moist without lesions or exudates. Mouth:     Oral mucosa and oropharynx without lesions or exudates.  Teeth in good repair. Neck:     No deformities, masses, or tenderness noted. Chest Wall:     No deformities, masses, or tenderness noted. Breasts:     No mass, nodules, thickening, tenderness, bulging, retraction, inflamation, nipple discharge or skin changes noted.   Lungs:     Normal respiratory effort, chest expands symmetrically. Lungs are clear to auscultation, no crackles or wheezes. Heart:     Normal rate and regular rhythm. S1 and S2 normal without gallop, murmur, click, rub or other extra sounds. Abdomen:  Bowel sounds positive,abdomen soft and non-tender without masses, organomegaly or hernias noted. Rectal:     No external abnormalities noted. Normal sphincter tone. No rectal masses or tenderness. Genitalia:     Pelvic Exam:        External: normal female genitalia without lesions or masses        Vagina: normal without lesions or masses        Cervix: normal without lesions or masses        Adnexa: normal bimanual exam without masses or fullness        Uterus: normal by palpation        Pap smear: performed Msk:     No deformity or scoliosis noted of thoracic or lumbar spine.   Pulses:     R and L carotid,radial,femoral,dorsalis pedis and posterior tibial pulses are full and equal bilaterally Extremities:     No clubbing, cyanosis, edema, or deformity noted with normal full range of motion of all joints.     Neurologic:     No cranial nerve deficits noted. Station and gait are normal. Plantar reflexes are down-going bilaterally. DTRs are symmetrical throughout. Sensory, motor and coordinative functions appear intact. Skin:     Intact without suspicious lesions or rashes Cervical Nodes:     No lymphadenopathy noted Axillary Nodes:     No palpable lymphadenopathy Inguinal Nodes:     No significant adenopathy Psych:     Cognition and judgment appear intact. Alert and cooperative with normal attention span and concentration. No apparent delusions, illusions, hallucinations    Impression & Recommendations:  Problem # 1:  HYPERTENSION (ICD-401.9) Assessment: Deteriorated  The following medications were removed from the medication list:    Lisinopril 10 Mg Tabs (Lisinopril) .Marland Kitchen... 1 once daily  Her updated medication list for this problem includes:    Atenolol 100 Mg Tabs (Atenolol) .Marland Kitchen... 1 tab qam    Furosemide 40 Mg Tabs (Furosemide) .Marland Kitchen... Take 1 tablet by mouth every morning ; cpx when due    Lisinopril 20 Mg Tabs (Lisinopril) .Marland Kitchen... Take 1 tablet by mouth every morning  Orders: EKG w/ Interpretation (93000)   Problem # 2:  DEPRESSION (ICD-311) Assessment: Improved  Her updated medication list for this problem includes:    Effexor Xr 150 Mg Cp24 (Venlafaxine hcl) .Marland Kitchen... 2 q am   Problem # 3:  HYPERLIPIDEMIA (ICD-272.4) Assessment: Improved  The following medications were removed from the medication list:    Lipitor 20 Mg Tabs (Atorvastatin calcium) ..... One tab once daily  Her updated medication list for this problem includes:    Zocor 40 Mg Tabs (Simvastatin) .Marland Kitchen... 1 tab @ bedtime  Orders: EKG w/ Interpretation (93000)   Complete Medication List: 1)  Effexor Xr 150 Mg Cp24 (Venlafaxine hcl) .... 2 q am 2)  Atenolol 100 Mg Tabs (Atenolol) .Marland Kitchen.. 1 tab qam 3)  Furosemide 40 Mg Tabs (Furosemide) .... Take 1 tablet by mouth every morning ; cpx when due 4)  Lisinopril 20 Mg  Tabs (Lisinopril) .... Take 1 tablet by mouth every morning 5)  Zocor 40 Mg Tabs (Simvastatin) .Marland Kitchen.. 1 tab @ bedtime 6)  Premarin 0.625 Mg/gm Crea (Estrogens, conjugated) .... Uad   Patient Instructions: 1)  Please schedule a follow-up appointment in 1 month. 2)  It is important that you exercise regularly at least 20 minutes 5 times a week. If you develop chest pain, have severe difficulty breathing, or feel very tired , stop exercising immediately and seek medical attention.  3)  You need to lose weight. Consider a lower calorie diet and regular exercise.  4)  Schedule your mammogram. 5)  Schedule a colonoscopy/sigmoidoscopy to help detect colon cancer. 6)  Take calcium +Vitamin D daily. 7)  Take an Aspirin every day. 8)  increase the lisinopril to 20 mg daily.  Check a morning blood pressure daily.  Return in 4 weeks for follow-up.  When you come back in a month bring a record of all your blood pressure readings and the device too   Prescriptions: PREMARIN 0.625 MG/GM CREA (ESTROGENS, CONJUGATED) UAD  #3 tubes x 4   Entered and Authorized by:   Roderick Pee MD   Signed by:   Roderick Pee MD on 12/23/2007   Method used:   Print then Give to Patient   RxID:   [id number] FUROSEMIDE 40 MG  TABS (FUROSEMIDE) Take 1 tablet by mouth every morning ; cpx when due  #100 x 3   Entered and Authorized by:   Roderick Pee MD   Signed by:   Roderick Pee MD on 12/23/2007   Method used:   Electronically to        [facility] [facility] [address] [phone]* (retail)       [address]       [place], [place]  [address]       Ph: [phone] or [phone]       Fax: [phone]   RxID:   [id number] ATENOLOL 100 MG  TABS (ATENOLOL) 1 tab qam  #100 x 3   Entered and Authorized by:   Roderick Pee MD   Signed by:   Roderick Pee MD on 12/23/2007   Method used:   [facility] to        [facility] [address] [phone]* (retail)       [address]       [place], [place]  [address]       Ph: [phone] or [phone]       Fax: [phone]   RxID:   [id number] EFFEXOR XR 150 MG  CP24 (VENLAFAXINE HCL) 2 q am  #200 x 3   Entered and Authorized by:   Roderick Pee MD   Signed by:   Roderick Pee MD on 12/23/2007   Method used:   Electron[facility] to        [facility] [phone]* (retail)       [address]       [place], [place]  [address]       Ph: [phone] or [phone]       Fax: [phone]   RxID:   [id number] ZOCOR 40 MG TABS (SIMVASTATIN) 1 tab @ bedtime  #100 x 3   Entered and Authorized by:   Roderick Pee MD   Signed by:   Roderick Pee MD on 12/23/2007   Method used:   Electronically to        [facility] [phone]* (retail)       [address]       [place], [place]  [address]       Ph: [phone] or [phone]       Fax: [phone]   RxID:   [id number]  LISINOPRIL 20 MG TABS (LISINOPRIL) Take 1 tablet by mouth every morning  #100 x 3   Entered and Authorized by:   Roderick Pee MD   Signed by:   Roderick Pee MD on 12/23/2007   Method used:   [facility] to        [facility] [phone]* (retail)       [address]       [place], [place]  [address]       Ph: [phone] or [phone]       Fax: [phone]   RxID:   [id number]  ]

## 2010-03-15 NOTE — Assessment & Plan Note (Signed)
Summary: elevated BP/dm   Vital Signs:  Patient Profile:   60 Years Old Female Height:     64 inches Weight:      211 pounds Temp:     98.3 degrees F oral Pulse rate:   64 / minute Pulse rhythm:   regular BP sitting:   142 / 90  (left arm) Cuff size:   regular  Vitals Entered By: Kern Reap CMA (January 02, 2008 11:19 AM)                 Chief Complaint:  follow up with blood pressure.  History of Present Illness: Mariena is a 60 year old female, who comes in today for evaluation of hypertension.  We saw her on November the ninth and started Lasix 40 mg daily, along with lisinopril 20 mg daily because her blood pressure was elevated.  She continues to get elevated blood pressures a0.  She has a mild headache, but otherwise feels well.  She's had no side effects from medication.  She has appointment to come back in December, the seventh of follow-up the came in today as her pressure has not dropped in 10 days.    Prior Medication List:  EFFEXOR XR 150 MG  CP24 (VENLAFAXINE HCL) 2 q am ATENOLOL 100 MG  TABS (ATENOLOL) 1 tab qam FUROSEMIDE 40 MG  TABS (FUROSEMIDE) Take 1 tablet by mouth every morning ; cpx when due LISINOPRIL 20 MG TABS (LISINOPRIL) Take 1 tablet by mouth every morning ZOCOR 40 MG TABS (SIMVASTATIN) 1 tab @ bedtime PREMARIN 0.625 MG/GM CREA (ESTROGENS, CONJUGATED) UAD   Current Allergies: No known allergies   Past Medical History:    Reviewed history from 12/23/2007 and no changes required:       DUB       Depression       Hypertension       Hyperlipidemia   Social History:    Reviewed history from 08/28/2006 and no changes required:       Married       Never Smoked       Alcohol use-no       Drug use-no    Review of Systems      See HPI   Physical Exam  General:     Well-developed,well-nourished,in no acute distress; alert,appropriate and cooperative throughout examination Heart:     160/110    Impression &  Recommendations:  Problem # 1:  HYPERTENSION (ICD-401.9) Assessment: Unchanged  Her updated medication list for this problem includes:    Atenolol 100 Mg Tabs (Atenolol) .Marland Kitchen... 1 tab qam    Furosemide 40 Mg Tabs (Furosemide) .Marland Kitchen... Take 1 tablet by mouth every morning ; cpx when due    Lisinopril 20 Mg Tabs (Lisinopril) .Marland Kitchen... Take 1 tablet by mouth every morning   Complete Medication List: 1)  Effexor Xr 150 Mg Cp24 (Venlafaxine hcl) .... 2 q am 2)  Atenolol 100 Mg Tabs (Atenolol) .Marland Kitchen.. 1 tab qam 3)  Furosemide 40 Mg Tabs (Furosemide) .... Take 1 tablet by mouth every morning ; cpx when due 4)  Lisinopril 20 Mg Tabs (Lisinopril) .... Take 1 tablet by mouth every morning 5)  Zocor 40 Mg Tabs (Simvastatin) .Marland Kitchen.. 1 tab @ bedtime 6)  Premarin 0.625 Mg/gm Crea (Estrogens, conjugated) .... Uad   Patient Instructions: 1)  increase the lisinopril to 40 mg a day.  Keep the Lasix at 40 mg in the atenolol at 100 mg daily.  Check a morning blood pressure returned  December the seventh with a copy of all your blood pressure readings and the device   ]

## 2010-03-15 NOTE — Progress Notes (Signed)
Summary: orders- mammo   Phone Note From Other Clinic   Caller: [facility] Call For: Sierra Banks Summary of Call: Patient is coming to [place] tomorrow for a six month follow up.  Isaiah Serge needs an order faxed for this visit  Their fax # is [phone] Initial call taken by: Roselle Locus,  February 27, 2007 8:38 AM  Follow-up for Phone Call        I called Dr. Cherlyn Labella office.  They do not need in order for this. Follow-up by: Roderick Pee MD,  February 27, 2007 1:00 PM    There is a

## 2010-03-15 NOTE — Assessment & Plan Note (Signed)
Summary: CPX // RS   Vital Signs:  Patient profile:   60 year old female Menstrual status:  postmenopausal Height:      63.5 inches Weight:      224 pounds BMI:     39.20 O2 Sat:      96 % on Room air Temp:     98.1 degrees F oral Pulse rate:   70 / minute BP sitting:   144 / 92  (left arm) Cuff size:   regular  Vitals Entered By: Kathlene November LPN (January 17, 2010 9:36 AM)  O2 Flow:  Room air CC: CPE with pap   CC:  CPE with pap.  History of Present Illness: Sierra Banks is a 60 year old, married female, nonsmoker, who comes in today for physical evaluation because of a history of underlying hypertension, depression, hyperlipidemia, obesity, low back pain.  She currently takes atenolol 100 mg daily, Lasix, 40 mg daily, lisinopril 80 mg daily, BP at home 140/70.  She also takes Effexor 300 mg in the morning and Wellbutrin 75 mg at bedtime.  She takes acyclovir p.r.n. and Flexeril and Vicodin p.r.n. for low back pain.  She is not exercising on a regular basis.  Advised to begin exercises 15 minutes twice a day for low back pain.  She gets routine eye care, dental care, BSE monthly, and a mammography, colonoscopy, normal , tetanus, 2005, seasonal flu shot 2011.  Current Medications (verified): 1)  Effexor Xr 150 Mg  Cp24 (Venlafaxine Hcl) .... 2 Q Am 2)  Atenolol 100 Mg  Tabs (Atenolol) .Marland Kitchen.. 1 Tab Qam 3)  Furosemide 40 Mg  Tabs (Furosemide) .... Take 1 Tablet By Mouth Every Morning ; Cpx When Due 4)  Zocor 40 Mg Tabs (Simvastatin) .Marland Kitchen.. 1 Tab @ Bedtime 5)  Lisinopril 40 Mg Tabs (Lisinopril) .... 2 By Mouth Qam 6)  Wellbutrin 75 Mg Tabs (Bupropion Hcl) .Marland Kitchen.. 1 Tab @ Bedtime 7)  Acyclovir 400 Mg Tabs (Acyclovir) .... Take 1 Tablet By Mouth Three Times A Day 8)  Flexeril 5 Mg Tabs (Cyclobenzaprine Hcl) .... Take Half Tab By Mouth Three Times A Day As Needed For Pain 9)  Vicodin Es 7.5-750 Mg Tabs (Hydrocodone-Acetaminophen) .... Take Half Tab By Mouth Three Times A Day As Needed For  Pain  Allergies (verified): No Known Drug Allergies  Comments:  Nurse/Medical Assistant: The patient's medications and allergies were reviewed with the patient and were updated in the Medication and Allergy Lists. Kathlene November LPN (January 17, 2010 9:37 AM)  Past History:  Past medical, surgical, family and social histories (including risk factors) reviewed, and no changes noted (except as noted below).  Past Medical History: Reviewed history from 12/23/2007 and no changes required. DUB Depression Hypertension Hyperlipidemia  Past Surgical History: Reviewed history from 08/28/2006 and no changes required. T&A BTL CB x 2  Family History: Reviewed history and no changes required.  Social History: Reviewed history from 08/28/2006 and no changes required. Married Never Smoked Alcohol use-no Drug use-no  Review of Systems      See HPI  Physical Exam  General:  Well-developed,well-nourished,in no acute distress; alert,appropriate and cooperative throughout examination Head:  Normocephalic and atraumatic without obvious abnormalities. No apparent alopecia or balding. Eyes:  No corneal or conjunctival inflammation noted. EOMI. Perrla. Funduscopic exam benign, without hemorrhages, exudates or papilledema. Vision grossly normal. Ears:  External ear exam shows no significant lesions or deformities.  Otoscopic examination reveals clear canals, tympanic membranes are intact bilaterally without bulging, retraction,  inflammation or discharge. Hearing is grossly normal bilaterally. Nose:  External nasal examination shows no deformity or inflammation. Nasal mucosa are pink and moist without lesions or exudates. Mouth:  Oral mucosa and oropharynx without lesions or exudates.  Teeth in good repair. Neck:  No deformities, masses, or tenderness noted. Chest Wall:  No deformities, masses, or tenderness noted. Breasts:  No mass, nodules, thickening, tenderness, bulging, retraction,  inflamation, nipple discharge or skin changes noted.   Lungs:  Normal respiratory effort, chest expands symmetrically. Lungs are clear to auscultation, no crackles or wheezes. Heart:  Normal rate and regular rhythm. S1 and S2 normal without gallop, murmur, click, rub or other extra sounds. Abdomen:  Bowel sounds positive,abdomen soft and non-tender without masses, organomegaly or hernias noted. Rectal:  No external abnormalities noted. Normal sphincter tone. No rectal masses or tenderness. Genitalia:  Pelvic Exam:        External: normal female genitalia without lesions or masses        Vagina: normal without lesions or masses        Cervix: normal without lesions or masses        Adnexa: normal bimanual exam without masses or fullness        Uterus: normal by palpation        Pap smear: performed Msk:  No deformity or scoliosis noted of thoracic or lumbar spine.   Pulses:  R and L carotid,radial,femoral,dorsalis pedis and posterior tibial pulses are full and equal bilaterally Extremities:  No clubbing, cyanosis, edema, or deformity noted with normal full range of motion of all joints.   Neurologic:  No cranial nerve deficits noted. Station and gait are normal. Plantar reflexes are down-going bilaterally. DTRs are symmetrical throughout. Sensory, motor and coordinative functions appear intact. Cervical Nodes:  No lymphadenopathy noted Axillary Nodes:  No palpable lymphadenopathy Inguinal Nodes:  No significant adenopathy Psych:  Cognition and judgment appear intact. Alert and cooperative with normal attention span and concentration. No apparent delusions, illusions, hallucinations   Impression & Recommendations:  Problem # 1:  OVERWEIGHT (ICD-278.02) Assessment Unchanged  Orders: Prescription Created Electronically [phone])  Problem # 2:  HYPERLIPIDEMIA (ICD-272.4) Assessment: Improved  Her updated medication list for this problem includes:    Zocor 40 Mg Tabs (Simvastatin) .Marland Kitchen... 1  tab @ bedtime  Orders: Prescription Created Electronically [id number])  Problem # 3:  HYPERTENSION (ICD-401.9) Assessment: Improved  Her updated medication list for this problem includes:    Atenolol 100 Mg Tabs (Atenolol) .Marland Kitchen... 1 tab qam    Furosemide 40 Mg Tabs (Furosemide) .Marland Kitchen... Take 1 tablet by mouth every morning ; cpx when due    Lisinopril 40 Mg Tabs (Lisinopril) .Marland Kitchen... 2 by mouth qam  Orders: Prescription Created Electronically [id number])  Problem # 4:  DEPRESSION (ICD-311) Assessment: Improved  Her updated medication list for this problem includes:    Effexor Xr 150 Mg Cp24 (Venlafaxine hcl) .Marland Kitchen... 2 q am    Wellbutrin 75 Mg Tabs (Bupropion hcl) .Marland Kitchen... 1 tab @ bedtime  Orders: Prescription Created Electronically [phone])  Problem # 5:  Preventive Health Care (ICD-V70.0) Assessment: Unchanged  Complete Medication List: 1)  Effexor Xr 150 Mg Cp24 (Venlafaxine hcl) .... 2 q am 2)  Atenolol 100 Mg Tabs (Atenolol) .Marland Kitchen.. 1 tab qam 3)  Furosemide 40 Mg Tabs (Furosemide) .... Take 1 tablet by mouth every morning ; cpx when due 4)  Zocor 40 Mg Tabs (Simvastatin) .Marland Kitchen.. 1 tab @ bedtime 5)  Lisinopril 40 Mg Tabs (Lisinopril) .... 2  by mouth qam 6)  Wellbutrin 75 Mg Tabs (Bupropion hcl) .Marland Kitchen.. 1 tab @ bedtime 7)  Acyclovir 400 Mg Tabs (Acyclovir) .... Take 1 tablet by mouth three times a day 8)  Flexeril 5 Mg Tabs (Cyclobenzaprine hcl) .... Take half tab by mouth three times a day as needed for pain 9)  Vicodin Es 7.5-750 Mg Tabs (Hydrocodone-acetaminophen) .... Take half tab by mouth three times a day as needed for pain  Other Orders: T-Bone Densitometry [phone])  Patient Instructions: 1)  continue your walking program. 2)   decrease y  caloric intake to 2000 calories daily with a goal to lose 12 pounds in the next 12 months.........also drank 30 ounces of water daily. 3)  Do the stretching exercises for your back 15 minutes twice daily. 4)  Take calcium +Vitamin D daily. 5)  Take an  Aspirin every day. 6)  Schedule your mammogram. 7)  Schedule a colonoscopy/sigmoidoscopy to help detect colon cancer. Prescriptions: ACYCLOVIR 400 MG TABS (ACYCLOVIR) Take 1 tablet by mouth three times a day  #50 x 3   Entered and Authorized by:   Roderick Pee MD   Signed by:   Roderick Pee MD on 01/17/2010   Method used:   [facility] to        [facility] [address] [phone]* (retail)       [address]       [place], [place]  [address]       Ph: [phone]       Fax: [phone]   RxID:   [id number] WELLBUTRIN 75 MG TABS (BUPROPION HCL) 1 tab @ bedtime  #100 x 3   Entered and Authorized by:   Roderick Pee MD   Signed by:   Roderick Pee MD on 01/17/2010   Method used:   [facility] to        [facility] [phone]* (retail)       [address]       [place], [place]  [address]       Ph: [phone]       Fax: [phone]   RxID:   [id number] LISINOPRIL 40 MG TABS (LISINOPRIL) 2 by mouth qam  #200 x 3   Entered and Authorized by:   Roderick Pee MD   Signed by:   Roderick Pee MD on 01/17/2010   Method used:   Electronically to        [facility] [phone]* (retail)       [address]       [place], [place]  [address]       Ph: [phone]       Fax: [phone]   RxID:   [id number] ZOCOR 40 MG TABS (SIMVASTATIN) 1 tab @ bedtime  #100 x 3   Entered and Authorized by:   Roderick Pee MD   Signed by:   Roderick Pee MD on 01/17/2010   Method used:   [facility]ronically to        [facility] [phone]* (retail)       [address]       [place], [place]  [address]       Ph: [phone]  Fax: [phone]   RxID:   [id number] FUROSEMIDE 40 MG  TABS (FUROSEMIDE) Take 1 tablet by mouth every morning ; cpx when due  #100 x 3   Entered and Authorized by:   Roderick Pee MD   Signed by:   Roderick Pee MD on 01/17/2010   Method used:    [facility]ronically to        [facility] [phone]* (retail)       [address]       [place], [place]  [address]       Ph: [phone]       Fax: [phone]   RxID:   [id number] ATENOLOL 100 MG  TABS (ATENOLOL) 1 tab qam  #100 x 3   Entered and Authorized by:   Roderick Pee MD   Signed by:   Roderick Pee MD on 01/17/2010   Method used:   Electronically to        [facility] [phone]* (retail)       [address]       [place], [place]  [address]       Ph: [phone]       Fax: [phone]   RxID:   [id number] EFFEXOR XR 150 MG  CP24 (VENLAFAXINE HCL) 2 q am  #200 x 3   Entered and Authorized by:   Roderick Pee MD   Signed by:   Roderick Pee MD on 01/17/2010   Method used:   [facility]ron[facility] to        [facility] [phone]* (retail)       [address]       [place], [place]  [address]       Ph: [phone]       Fax: [phone]   RxID:   [id number]    Orders Added: 1)  Prescription Created Electronically [G8553] 2)  Est. Patient 40-64 years [99396] 3)  T-Bone Densitometry [phone]

## 2010-03-15 NOTE — Progress Notes (Signed)
Summary: REFILL  Medications Added ADDERALL XR 30 MG  CP24 (AMPHETAMINE-DEXTROAMPHETAMINE) once daily       Phone Note Call from Patient Call back at [phone]   Caller: PATIENT LIVE Call For: TODD Summary of Call: ADDERALL XR 30MG   REFILL Initial call taken by: Roselle Locus,  April 25, 2007 9:02 AM  Follow-up for Phone Call        please print 3 prescriptions and I will sign them. Follow-up by: Roderick Pee MD,  April 25, 2007 5:23 PM  Additional Follow-up for Phone Call Additional follow up Details #1::        Rx printed and given to MD for signature. Patient will  pick up Additional Follow-up by: Rock Nephew CMA,  April 26, 2007 9:18 AM    New/Updated Medications: ADDERALL XR 30 MG  CP24 (AMPHETAMINE-DEXTROAMPHETAMINE) once daily   Prescriptions: ADDERALL XR 30 MG  CP24 (AMPHETAMINE-DEXTROAMPHETAMINE) once daily  #30 x 0   Entered by:   Rock Nephew CMA   Authorized by:   Roderick Pee MD   Signed by:   Rock Nephew CMA on 04/26/2007   Method used:   Print then Give to Patient   RxID:   [id number] ADDERALL XR 30 MG  CP24 (AMPHETAMINE-DEXTROAMPHETAMINE) once daily  #30 x 0   Entered by:   Rock Nephew CMA   Authorized by:   Roderick Pee MD   Signed by:   Rock Nephew CMA on 04/26/2007   Method used:   Print then Give to Patient   RxID:   [id number] ADDERALL XR 30 MG  CP24 (AMPHETAMINE-DEXTROAMPHETAMINE) once daily  #30 x 0   Entered by:   Rock Nephew CMA   Authorized by:   Roderick Pee MD   Signed by:   Rock Nephew CMA on 04/26/2007   Method used:   Print then Give to Patient   RxID:   [id number]

## 2010-03-15 NOTE — Progress Notes (Signed)
Summary: refill  Phone Note From Pharmacy   Summary of Call: patient would like a refill of flexeril is this okay to fill? Initial call taken by: Kern Reap CMA Duncan Dull),  June 09, 2009 10:01 AM  Follow-up for Phone Call        Flexeril 10-mg tabs dispensed 30 directions one half to one tablet nightly p.r.n. refills x 2 Follow-up by: Roderick Pee MD,  June 09, 2009 11:38 AM  Additional Follow-up for Phone Call Additional follow up Details #1::        Pharmacist called Additional Follow-up by: Kern Reap CMA Duncan Dull),  June 09, 2009 11:53 AM

## 2010-03-15 NOTE — Miscellaneous (Signed)
Summary: [facility]  Clinical Lists Changes  Medications: Added new medication of MOVIPREP 100 GM  SOLR (PEG-KCL-NACL-NASULF-NA ASC-C) As per prep instructions. - Signed Rx of MOVIPREP 100 GM  SOLR (PEG-KCL-NACL-NASULF-NA ASC-C) As per prep instructions.;  #1 x 0;  Signed;  Entered by: Ezra Sites RN;  Authorized by: Meryl Dare MD [facility];  Method used: [facility] [address] [id number] [address], [place], [place], [place]  [address], Ph: [phone], Fax: [phone] Observations: Added new observation of NKA: T (01/22/2009 13:48)    Prescriptions: MOVIPREP 100 GM  SOLR (PEG-KCL-NACL-NASULF-NA ASC-C) As per prep instructions.  #1 x 0   Entered by:   Ezra Sites RN   Authorized by:   Meryl Dare MD [facility]   Signed by:   Ezra Sites RN on 01/22/2009   Method used:   [facility]        [facility] [address] [id number]       [address]       [place], [place]  [address]       Ph: [phone]       Fax: [phone]   RxID:   [id number]

## 2010-03-15 NOTE — Assessment & Plan Note (Signed)
Summary: BRUISED AREA BREAST/PS   Vital Signs:  Patient profile:   60 year old female Menstrual status:  postmenopausal Weight:      224 pounds Temp:     98.2 degrees F oral BP sitting:   160 / 96  (left arm) Cuff size:   regular  Vitals Entered By: Kern Reap CMA Duncan Dull) (October 07, 2009 10:08 AM) CC: right breast - bruise   CC:  right breast - bruise.  History of Present Illness: Sierra Banks is a 60 year old female, who comes in today for evaluation of a sore red lump under right breast x 3 days.  No history of trauma.  Allergies: No Known Drug Allergies  Past History:  Past medical, surgical, family and social histories (including risk factors) reviewed for relevance to current acute and chronic problems.  Past Medical History: Reviewed history from 12/23/2007 and no changes required. DUB Depression Hypertension Hyperlipidemia  Past Surgical History: Reviewed history from 08/28/2006 and no changes required. T&A BTL CB x 2  Family History: Reviewed history and no changes required.  Social History: Reviewed history from 08/28/2006 and no changes required. Married Never Smoked Alcohol use-no Drug use-no  Review of Systems      See HPI  Physical Exam  General:  Well-developed,well-nourished,in no acute distress; alert,appropriate and cooperative throughout examination Breasts:  there is a, silver dollar-sized abscess right breast at the 3 o'clock position 3 inches from the nipple.  There is also a little folliculitis on the left breast.   Impression & Recommendations:  Problem # 1:  ABSCESS, BREAST, RIGHT (ICD-611.0) Assessment New  Complete Medication List: 1)  Effexor Xr 150 Mg Cp24 (Venlafaxine hcl) .... 2 q am 2)  Atenolol 100 Mg Tabs (Atenolol) .Marland Kitchen.. 1 tab qam 3)  Furosemide 40 Mg Tabs (Furosemide) .... Take 1 tablet by mouth every morning ; cpx when due 4)  Zocor 40 Mg Tabs (Simvastatin) .Marland Kitchen.. 1 tab @ bedtime 5)  Lisinopril 40 Mg Tabs  (Lisinopril) .... 2 by mouth qam 6)  Wellbutrin 75 Mg Tabs (Bupropion hcl) .Marland Kitchen.. 1 tab @ bedtime 7)  Acyclovir 400 Mg Tabs (Acyclovir) .... Take 1 tablet by mouth three times a day 8)  Flexeril 5 Mg Tabs (Cyclobenzaprine hcl) .... Take half tab by mouth three times a day as needed for pain 9)  Vicodin Es 7.5-750 Mg Tabs (Hydrocodone-acetaminophen) .... Take half tab by mouth three times a day as needed for pain 10)  Keflex 500 Mg Caps (Cephalexin) .... 2 by mouth two times a day  Patient Instructions: 1)  warm soaks to 15 minutes 4 times a day begin Keflex two tabs b.i.d. return p.r.n. Prescriptions: KEFLEX 500 MG CAPS (CEPHALEXIN) 2 by mouth two times a day  #40 x 1   Entered and Authorized by:   Roderick Pee MD   Signed by:   Roderick Pee MD on 10/07/2009   Method used:   [facility]        [facility] [address] [phone][facility] (retail)       [address]       [place], [place]  [address]       Ph: [phone]       Fax: [phone]   RxID:   [id number]

## 2010-03-15 NOTE — Progress Notes (Signed)
Summary: back spasms  Phone Note Call from Patient Call back at Work Phone [phone]   Caller: Patient Call For: Sierra Banks Summary of Call: patient is calling because she is having back spasms.  she has had a history of back spasms and would like something called in if possible. Initial call taken by: Kern Reap CMA Duncan Dull),  September 20, 2009 10:11 AM  Follow-up for Phone Call        Flexeril 5 mg, dispensed 30 tablets, directions one half tablet 3 times a day as needed for back spasms, refills x 1 also Vicodin ES, number 30 directions one half tablet 3 times a day as needed for severe pain and refills x 1.  If symptoms do not resolve within a week.  She needs an office visit Follow-up by: Sierra Banks,  September 20, 2009 10:46 AM  Additional Follow-up for Phone Call Additional follow up Details #1::        rx sent/called in.  left message on machine for patient  Additional Follow-up by: Kern Reap CMA (AAMA),  September 20, 2009 1:00 PM    New/Updated Medications: FLEXERIL 5 MG TABS (CYCLOBENZAPRINE HCL) take half tab by mouth three times a day as needed for pain VICODIN ES 7.5-750 MG TABS (HYDROCODONE-ACETAMINOPHEN) take half tab by mouth three times a day as needed for pain Prescriptions: VICODIN ES 7.5-750 MG TABS (HYDROCODONE-ACETAMINOPHEN) take half tab by mouth three times a day as needed for pain  #30 x 1   Entered by:   Kern Reap CMA (AAMA)   Authorized by:   Sierra Banks   Signed by:   Kern Reap CMA (AAMA) on 09/20/2009   Method used:   Telephoned to ...       [facility] [address] [id number]       [address]       [place], [place]  [address]       Ph: [phone]       Fax: [phone]   RxID:   [id number] FLEXERIL 5 MG TABS (CYCLOBENZAPRINE HCL) take half tab by mouth three times a day as needed for pain  #30 x 1   Entered by:   Kern Reap CMA (AAMA)   Authorized by:   Sierra Banks   Signed by:    Kern Reap CMA (AAMA) on 09/20/2009   Method used:   [facility]        [facility] [address] [phone][facility] (retail)       [address]       [place], [place]  [address]       Ph: [phone]       Fax: [phone]   RxID:   [id number]

## 2010-03-15 NOTE — Assessment & Plan Note (Signed)
Summary: ? rash//ccm   Vital Signs:  Patient profile:   60 year old female Menstrual status:  postmenopausal Weight:      224 pounds Temp:     98.3 degrees F oral BP sitting:   152 / 96  (left arm) Cuff size:   regular  Vitals Entered By: Kern Reap CMA Duncan Dull) (Jul 01, 2009 12:30 PM) CC: rash on back   CC:  rash on back.  History of Present Illness: Sierra Banks is a 60 year old, married female, nonsmoker, who comes in today for evaluation of two problems.  Two days ago she began having some itching in her back yesterday broke out with a rash.  She had an episode like this year.  Ago, went to the urgent care and was told she had shingles.  She takes Wellbutrin 300 mg daily, however, she still has sleep dysfunction.  Allergies: No Known Drug Allergies  Social History: Reviewed history from 08/28/2006 and no changes required. 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 2 q am    Wellbutrin 75 Mg Tabs (Bupropion hcl) .Marland Kitchen... 1 tab @ bedtime  Complete Medication List: 1)  Effexor Xr 150 Mg Cp24 (Venlafaxine hcl) .... 2 q am 2)  Atenolol 100 Mg Tabs (Atenolol) .Marland Kitchen.. 1 tab qam 3)  Furosemide 40 Mg Tabs (Furosemide) .... Take 1 tablet by mouth every morning ; cpx when due 4)  Zocor 40 Mg Tabs (Simvastatin) .Marland Kitchen.. 1 tab @ bedtime 5)  Lisinopril 40 Mg Tabs (Lisinopril) .... 2 by mouth qam 6)  Flexeril 10 Mg Tabs (Cyclobenzaprine hcl) .Marland Kitchen.. 1 tab @ bedtime 7)  Wellbutrin 75 Mg Tabs (Bupropion  hcl) .Marland Kitchen.. 1 tab @ bedtime 8)  Acyclovir 400 Mg Tabs (Acyclovir) .... Take 1 tablet by mouth three times a day  Patient Instructions: 1)  begin Zovirax 400 mg 3 times daily.  Also, Benadryl, 25 to 50 mg at bedtime, and Claritin plain, 10 mg in the morning. 2)  Increase the Wellbutrin to 375  mg nightly.  If in 4 weeks.  y  don't see any improvement in y  sleep return. Prescriptions: ACYCLOVIR 400 MG TABS (ACYCLOVIR) Take 1 tablet by mouth three times a day  #50 x 3   Entered and Authorized by:   Roderick Pee MD   Signed by:   Roderick Pee MD on 07/01/2009   Method used:   [facility]        [facility] [address] [phone][facility] (retail)       [address]       [place], [place]  [address]       Ph: [phone]       Fax: [phone]   RxID:   [id number] WELLBUTRIN 75 MG TABS (BUPROPION HCL) 1 tab @ bedtime  #100 x 3   Entered and Authorized by:   Roderick Pee MD   Signed by:   Roderick Pee MD on 07/01/2009   Method used:   [facility]ronically  to        [facility] [address] [id number] ([facility])       [address]       [place], [place]  [address]       Ph: [phone]       Fax: [phone]   RxID:   [id number]

## 2010-03-15 NOTE — Assessment & Plan Note (Signed)
Summary: CPX/PAP/NJR   Vital Signs:  Patient profile:   60 year old female Menstrual status:  postmenopausal Height:      63.5 inches Weight:      223 pounds BMI:     39.02 Temp:     98.5 degrees F oral BP sitting:   140 / 96  (left arm) Cuff size:   large  Vitals Entered By: Kern Reap CMA Duncan Dull) (January 12, 2009 8:36 AM)  Reason for Visit cpx  History of Present Illness: Sierra Banks is a 60 year old, married female, nonsmoker comes in today for evaluation of hyperlipidemia, hypertension depression, obesity, low back pain.  Her hyperlipidemia is treated with Zocor 40 mg nightly, Lopressor oh with an LDL of 90.  Her hypertension is treated with atenolol 100 mg daily, Lasix, 40 mg daily, lisinopril 40 mg two tabs daily.  BP 140/96.  She states her blood pressure home is 140/80.  The depression is treated with Effexor 150 mg.  Ex R2 tabs daily.  Mood is good.  Weight is up to 223 pounds.  She is not following a diet or exercise program.  Two weeks ago she began experiencing low back pain.  She states his right and left low back pain is constant, but not there today.  She describes as dull, gradual onset of 4 on a scale of one to 10 with radiation under left lateral 5.  Today the pain is gone.  No history of trauma.  She gets routine eye care.  Dental care as previously monthly and has had a flexible sigmoidoscopy.  She's due for a full colonoscopy.  Tetanus booster 2005 seasonal flu 2010  Allergies (verified): No Known Drug Allergies  Past History:  Past medical, surgical, family and social histories (including risk factors) reviewed, and no changes noted (except as noted below).  Past Medical History: Reviewed history from 12/23/2007 and no changes required. DUB Depression Hypertension Hyperlipidemia  Past Surgical History: Reviewed history from 08/28/2006 and no changes required. T&A BTL CB x 2  Family History: Reviewed history and no changes  required.  Social History: Reviewed history from 08/28/2006 and no changes required. Married Never Smoked Alcohol use-no Drug use-no  Review of Systems      See HPI  Physical Exam  General:  Well-developed,well-nourished,in no acute distress; alert,appropriate and cooperative throughout examination Head:  Normocephalic and atraumatic without obvious abnormalities. No apparent alopecia or balding. Eyes:  No corneal or conjunctival inflammation noted. EOMI. Perrla. Funduscopic exam benign, without hemorrhages, exudates or papilledema. Vision grossly normal. Ears:  External ear exam shows no significant lesions or deformities.  Otoscopic examination reveals clear canals, tympanic membranes are intact bilaterally without bulging, retraction, inflammation or discharge. Hearing is grossly normal bilaterally. Nose:  External nasal examination shows no deformity or inflammation. Nasal mucosa are pink and moist without lesions or exudates. Mouth:  Oral mucosa and oropharynx without lesions or exudates.  Teeth in good repair. Neck:  No deformities, masses, or tenderness noted. Chest Wall:  No deformities, masses, or tenderness noted. Breasts:  No mass, nodules, thickening, tenderness, bulging, retraction, inflamation, nipple discharge or skin changes noted.   Lungs:  Normal respiratory effort, chest expands symmetrically. Lungs are clear to auscultation, no crackles or wheezes. Heart:  Normal rate and regular rhythm. S1 and S2 normal without gallop, murmur, click, rub or other extra sounds. Abdomen:  Bowel sounds positive,abdomen soft and non-tender without masses, organomegaly or hernias noted. Rectal:  No external abnormalities noted. Normal sphincter tone. No rectal masses  or tenderness. Genitalia:  Pelvic Exam:        External: normal female genitalia without lesions or masses        Vagina: normal without lesions or masses        Cervix: normal without lesions or masses        Adnexa:  normal bimanual exam without masses or fullness        Uterus: normal by palpation        Pap smear: performed Msk:  No deformity or scoliosis noted of thoracic or lumbar spine.   Pulses:  R and L carotid,radial,femoral,dorsalis pedis and posterior tibial pulses are full and equal bilaterally Extremities:  No clubbing, cyanosis, edema, or deformity noted with normal full range of motion of all joints.   Neurologic:  No cranial nerve deficits noted. Station and gait are normal. Plantar reflexes are down-going bilaterally. DTRs are symmetrical throughout. Sensory, motor and coordinative functions appear intact. Skin:  Intact without suspicious lesions or rashes Cervical Nodes:  No lymphadenopathy noted Axillary Nodes:  No palpable lymphadenopathy Inguinal Nodes:  No significant adenopathy Psych:  Cognition and judgment appear intact. Alert and cooperative with normal attention span and concentration. No apparent delusions, illusions, hallucinations   Impression & Recommendations:  Problem # 1:  HYPERLIPIDEMIA (ICD-272.4) Assessment Improved  Her updated medication list for this problem includes:    Zocor 40 Mg Tabs (Simvastatin) .Marland Kitchen... 1 tab @ bedtime  Orders: Prescription Created Electronically [phone]) EKG w/ Interpretation (93000)  Problem # 2:  HYPERTENSION (ICD-401.9) Assessment: Improved  The following medications were removed from the medication list:    Furosemide 20 Mg Tabs (Furosemide) .Marland Kitchen... 1 @noon  Her updated medication list for this problem includes:    Atenolol 100 Mg Tabs (Atenolol) .Marland Kitchen... 1 tab qam    Furosemide 40 Mg Tabs (Furosemide) .Marland Kitchen... Take 1 tablet by mouth every morning ; cpx when due    Lisinopril 40 Mg Tabs (Lisinopril) .Marland Kitchen... 2 by mouth qam  Orders: Prescription Created Electronically [id number]) EKG w/ Interpretation (93000)  Problem # 3:  DEPRESSION (ICD-311) Assessment: Improved  Her updated medication list for this problem includes:    Effexor Xr 150  Mg Cp24 (Venlafaxine hcl) .Marland Kitchen... 2 q am  Orders: Prescription Created Electronically [phone])  Problem # 4:  LOW BACK PAIN, ACUTE (ICD-724.2) Assessment: New  Orders: Prescription Created Electronically [phone])  Her updated medication list for this problem includes:    Flexeril 10 Mg Tabs (Cyclobenzaprine hcl) .Marland Kitchen... 1 tab @ bedtime  Problem # 5:  OVERWEIGHT (ICD-278.02) Assessment: New  Orders: Prescription Created Electronically [phone])  Complete Medication List: 1)  Effexor Xr 150 Mg Cp24 (Venlafaxine hcl) .... 2 q am 2)  Atenolol 100 Mg Tabs (Atenolol) .Marland Kitchen.. 1 tab qam 3)  Furosemide 40 Mg Tabs (Furosemide) .... Take 1 tablet by mouth every morning ; cpx when due 4)  Zocor 40 Mg Tabs (Simvastatin) .Marland Kitchen.. 1 tab @ bedtime 5)  Lisinopril 40 Mg Tabs (Lisinopril) .... 2 by mouth qam 6)  Flexeril 10 Mg Tabs (Cyclobenzaprine hcl) .Marland Kitchen.. 1 tab @ bedtime  Other Orders: Gastroenterology Referral (GI)  Patient Instructions: 1)  It is important that you exercise regularly at least 20 minutes 5 times a week. If you develop chest pain, have severe difficulty breathing, or feel very tired , stop exercising immediately and seek medical attention. 2)  You need to lose weight. Consider a lower calorie diet and regular exercise. .............Marland Kitchenbegin with a 15 minute walk daily and decrease her caloric  intake to 2000 calories daily also drink 30 ounces of water daily 3)  Please schedule a follow-up appointment in 1 year. 4)  Schedule your mammogram. 5)  Schedule a colonoscopy/sigmoidoscopy to help detect colon cancer. 6)  Take calcium +Vitamin D daily. 7)  Take an Aspirin every day. Prescriptions: FLEXERIL 10 MG TABS (CYCLOBENZAPRINE HCL) 1 tab @ bedtime  #30 x 2   Entered and Authorized by:   Roderick Pee MD   Signed by:   Roderick Pee MD on 01/12/2009   Method used:   Print then Give to Patient   RxID:   [id number] LISINOPRIL 40 MG TABS (LISINOPRIL) 2 by mouth qam  #200 x 3   Entered  and Authorized by:   Roderick Pee MD   Signed by:   Roderick Pee MD on 01/12/2009   Method used:   [facility]        [facility] [address] [phone] ([facility])       [address]       [place], [place]  [address]       Ph: [phone]       Fax: [phone]   RxID:   [id number] ZOCOR 40 MG TABS (SIMVASTATIN) 1 tab @ bedtime  #100 x 3   Entered and Authorized by:   Roderick Pee MD   Signed by:   Roderick Pee MD on 01/12/2009   Method used:   [facility]ically to        [facility] [address] [phone]* (retail)       [address]       [place], [place]  [address]       Ph: [phone]       Fax: [phone]   RxID:   [id number] FUROSEMIDE 40 MG  TABS (FUROSEMIDE) Take 1 tablet by mouth every morning ; cpx when due  #100 x 3   Entered and Authorized by:   Roderick Pee MD   Signed by:   Roderick Pee MD on 01/12/2009   Method used:   [facility]        [facility] [address] [phone]* (retail)       [address]       [place], [place]  [address]       Ph: [phone]       Fax: [phone]   RxID:   [id number] ATENOLOL 100 MG  TABS (ATENOLOL) 1 tab qam  #100 x 3   Entered and Authorized by:   Roderick Pee MD   Signed by:   Roderick Pee MD on 01/12/2009   Method used:   [facility]ronically to        [facility] [address] [phone]* (retail)       [address]       [place], [place]  [address]       Ph: [phone]       Fax: [phone]   RxID:   [id number] EFFEXOR XR 150 MG  CP24 (VENLAFAXINE HCL) 2 q am  #200 x 3   Entered and Authorized by:   Roderick Pee MD   Signed by:   Roderick Pee MD on 01/12/2009   Method used:   [facility]  [facility] [address] [id number] ([facility]ail)       [address]       [place], [place]  [address]       Ph: [phone]       Fax: [phone]   RxID:   [id number]    Immunization  History:  Influenza Immunization History:    Influenza:  historical (11/13/2008)

## 2010-03-15 NOTE — Assessment & Plan Note (Signed)
Summary: GO OVER UTI/JNL   Vital Signs:  Patient Profile:   60 Years Old Female Weight:      209 pounds Temp:     98 degrees F oral Pulse rate:   70 / minute Pulse rhythm:   regular  Pt. in pain?   no                Chief Complaint:  seen in sat clinic w pylo, on cipro temp, and chills gone.  History of Present Illness: 7/16 and 7/17 pt had back pain adn myalgis, then 7/18 fever chills.seen 7/19 in sat clinic.dx pylo given cipro 500 two times a day asym now.    Past Medical History:    Reviewed history from 08/28/2006 and no changes required:       DUB       Depression       Hypertension   Family History:    Reviewed history and no changes required:  Social History:    Reviewed history from 08/28/2006 and no changes required:       Married       Never Smoked       Alcohol use-no       Drug use-no    Review of Systems       The patient complains of fever.     Physical Exam  General:     Well-developed,well-nourished,in no acute distress; alert,appropriate and cooperative throughout examination Abdomen:     Bowel sounds positive,abdomen soft and non-tender without masses, organomegaly or hernias noted.    Impression & Recommendations:  Problem # 1:  PYELONEPHRITIS, ACUTE NOS (ICD-590.10) finish two times a day cipro, then cipro 500 at bedtime x 2 weeks.

## 2010-03-15 NOTE — Progress Notes (Signed)
Summary: REFILL   Phone Note From Pharmacy Call back at [phone] FAX   [facility]: [id number] Call For: TODD  Summary of Call: EFFEXOR XR 150 MG CAPSULE WYE QTY 90 TAKE 1 CAPSULE EVERY MORNING LAST FILL 08/10/06 Initial call taken by: Roselle Locus,  December 03, 2006 10:21 AM  Follow-up for Phone Call        Phone call completed, Pharmacist called [phone],MDAWARE,KEEP OR SCHEDULE CPX WHEN DUE Follow-up by: Arcola Jansky, RN,  December 03, 2006 2:39 PM  Additional Follow-up for Phone Call Additional follow up Details #1::        okayed a refill prescription number hundred, one a day.  Four refills Additional Follow-up by: Roderick Pee MD,  December 03, 2006 4:17 PM

## 2010-03-15 NOTE — Progress Notes (Signed)
Summary: needs order faxed   Phone Note From Other Clinic   Caller: [facility] Call For: [facility] Summary of Call: needs order for bilateral diagnostic mammogram and left ultrasound.  please fax order  Initial call taken by: Roselle Locus,  August 22, 2007 3:09 PM  Follow-up for Phone Call        ok Follow-up by: Roderick Pee MD,  August 22, 2007 3:47 PM  Additional Follow-up for Phone Call Additional follow up Details #1::        Faxed to [phone] Additional Follow-up by: Florentina Addison,  August 22, 2007 3:55 PM

## 2010-03-15 NOTE — Assessment & Plan Note (Signed)
Summary: 1 month rov/njr   Vital Signs:  Patient Profile:   60 Years Old Female Height:     64 inches Weight:      210 pounds Temp:     97.6 degrees F oral Pulse rate:   64 / minute Pulse rhythm:   regular BP sitting:   148 / 78  (left arm) Cuff size:   regular  Vitals Entered By: Kern Reap CMA (January 20, 2008 9:12 AM)                 Chief Complaint:  follow up with blood pressure.  History of Present Illness: Sierra Banks is a 60 year old female, who comes back today for reevaluation of hypertension.  She is on atenolol hundred milligrams daily, lisinopril 60 mg daily, Lasix 40 mg daily, and BP has dropped down to 160/90.  It was 190 over hundred and 10.  She's had no side effects from medication.  However, BP is not at goal.  She states she feels bloated still despite the 40 mg of Lasix.    Updated Prior Medication List: EFFEXOR XR 150 MG  CP24 (VENLAFAXINE HCL) 2 q am ATENOLOL 100 MG  TABS (ATENOLOL) 1 tab qam FUROSEMIDE 40 MG  TABS (FUROSEMIDE) Take 1 tablet by mouth every morning ; cpx when due LISINOPRIL 20 MG TABS (LISINOPRIL) Take 1 tablet by mouth every morning ZOCOR 40 MG TABS (SIMVASTATIN) 1 tab @ bedtime  Current Allergies: No known allergies   Past Medical History:    Reviewed history from 12/23/2007 and no changes required:       DUB       Depression       Hypertension       Hyperlipidemia   Social History:    Reviewed history from 08/28/2006 and no changes required:       Married       Never Smoked       Alcohol use-no       Drug use-no    Review of Systems      See HPI   Physical Exam  General:     Well-developed,well-nourished,in no acute distress; alert,appropriate and cooperative throughout examination Heart:     160/90 Extremities:     1+ left pedal edema and 1+ right pedal edema.      Impression & Recommendations:  Problem # 1:  HYPERTENSION (ICD-401.9) Assessment: Improved  The following medications were removed  from the medication list:    Lisinopril 20 Mg Tabs (Lisinopril) .Marland Kitchen... Take 1 tablet by mouth every morning  Her updated medication list for this problem includes:    Atenolol 100 Mg Tabs (Atenolol) .Marland Kitchen... 1 tab qam    Furosemide 40 Mg Tabs (Furosemide) .Marland Kitchen... Take 1 tablet by mouth every morning ; cpx when due    Lisinopril 40 Mg Tabs (Lisinopril) .Marland Kitchen... 1 &1/2 qam    Furosemide 20 Mg Tabs (Furosemide) .Marland Kitchen... 1 @noon    Complete Medication List: 1)  Effexor Xr 150 Mg Cp24 (Venlafaxine hcl) .... 2 q am 2)  Atenolol 100 Mg Tabs (Atenolol) .Marland Kitchen.. 1 tab qam 3)  Furosemide 40 Mg Tabs (Furosemide) .... Take 1 tablet by mouth every morning ; cpx when due 4)  Zocor 40 Mg Tabs (Simvastatin) .Marland Kitchen.. 1 tab @ bedtime 5)  Lisinopril 40 Mg Tabs (Lisinopril) .Marland Kitchen.. 1 &1/2 qam 6)  Furosemide 20 Mg Tabs (Furosemide) .Marland Kitchen.. 1 @noon    Patient Instructions: 1)  increase the Lasix to 60 mg daily.  Check a  morning blood pressure daily.  Return in one month for follow-up 2)  Limit your Sodium (Salt). 3)  It is important that you exercise regularly at least 20 minutes 5 times a week. If you develop chest pain, have severe difficulty breathing, or feel very tired , stop exercising immediately and seek medical attention. 4)  You need to lose weight. Consider a lower calorie diet and regular exercise.    Prescriptions: FUROSEMIDE 20 MG TABS (FUROSEMIDE) 1 @noon   #100 x 3   Entered and Authorized by:   Roderick Pee MD   Signed by:   Roderick Pee MD on 01/20/2008   Method used:   Electronically to        [facility]  [address] [phone]* (retail)       [address]       [place], [place]  [address]       Ph: [phone] or [phone]       Fax: [phone]   RxID:   [id number] LISINOPRIL 40 MG TABS (LISINOPRIL) 1 &1/2 qam  #150 x 3   Entered and Authorized by:   Roderick Pee MD   Signed by:   Roderick Pee MD on 01/20/2008   Method used:   [facility]ronically to        [facility]  [address]  [phone]* (retail)       [address]       [place], [place]  [address]       Ph: [phone] or [phone]       Fax: [phone]   RxID:   [id number]  ]

## 2010-03-15 NOTE — Progress Notes (Signed)
Summary: refill  Phone Note Call from Patient Call back at Home Phone [phone]   Caller: pt live Call For: Tawanna Cooler Summary of Call: lipitor 20 mg take 1 per day.  Her drugstore sent in a refill order on 9-23.  Drugstore says they have never received the ok to fill back from our office.  Please resend ok for this rx.  [facility] [address]  Initial call taken by: Roselle Locus,  November 13, 2007 1:04 PM      Prescriptions: LIPITOR 20 MG TABS (ATORVASTATIN CALCIUM) one tab once daily  #30 x 0   Entered by:   Kern Reap CMA   Authorized by:   Roderick Pee MD   Signed by:   Kern Reap CMA on 11/14/2007   Method used:   [facility]        [facility]  [address] [phone][facility] ([facility]ail)       [address]       [place], [place]  [address]       Ph: [phone] or [phone]       Fax: [phone]   RxID:   [id number]

## 2010-03-15 NOTE — Letter (Signed)
Summary: [facility]  [facility]   Imported By: Kassie Mends 09/28/2006 13:00:22  _____________________________________________________________________  External Attachment:    Type:   Image     Comment:   [facility]

## 2010-03-16 ENCOUNTER — Ambulatory Visit (INDEPENDENT_AMBULATORY_CARE_PROVIDER_SITE_OTHER): Payer: 59 | Admitting: Family Medicine

## 2010-03-16 ENCOUNTER — Encounter: Payer: Self-pay | Admitting: Family Medicine

## 2010-03-16 VITALS — BP 144/98 | Temp 98.7°F | Ht 63.25 in | Wt 218.0 lb

## 2010-03-16 DIAGNOSIS — I10 Essential (primary) hypertension: Secondary | ICD-10-CM

## 2010-03-16 NOTE — Patient Instructions (Signed)
Continue your current medications.  Remember, you can take an extra 100 mg of the beta blocker.  If your blood pressure systolic goes above 180.  Continue to drink lots of water stay at bed rest at home.  Return Friday for follow-up.

## 2010-03-16 NOTE — Progress Notes (Signed)
Sierra Banks is a 60 year old, married female, nonsmoker, who comes in today for follow-up of hypertension.  We saw her yesterday with severe hypertension.  We placed her at bed rest, increased her beta blocker 200 mg b.i.d., and increased her Norvasc from 5 mg a day to 10.  We kept her at Lasix at 80 mg daily, lisinopril 80 mg daily.  Resting heart rate is 60.  No lightheadedness.  Yesterday we had to give her and asked her dose of the beta blocker because her pressure was 180.  Today, BP right arm sitting position is 160/84.  Pulse 60 and regular.  No side effects from medication.  Electrolytes normal except for GFR 58, creatinine 1.0

## 2010-03-17 NOTE — Miscellaneous (Signed)
Summary: BONE DENSITY  Clinical Lists Changes  Orders: Added new Test order of T-Lumbar Vertebral Assessment (77082) - Signed 

## 2010-03-18 ENCOUNTER — Encounter: Payer: Self-pay | Admitting: Family Medicine

## 2010-03-18 ENCOUNTER — Ambulatory Visit (INDEPENDENT_AMBULATORY_CARE_PROVIDER_SITE_OTHER): Payer: 59 | Admitting: Family Medicine

## 2010-03-18 VITALS — BP 130/90 | Temp 98.3°F | Wt 220.0 lb

## 2010-03-18 DIAGNOSIS — I10 Essential (primary) hypertension: Secondary | ICD-10-CM

## 2010-03-18 NOTE — Progress Notes (Signed)
  Subjective:    Patient ID: Sierra Banks, female    DOB: 1950-04-14, 60 y.o.   MRN: [id number]  HPI Sierra Banks is a 60 y/o fem. Who comes For follow-up of hypertension.  She is currently on Tenormin 100 mg b.i.d., Norvasc, 10 mg daily, Lasix, 40 mg daily, lisinopril 80 mg daily.  Her BP today here is 150/90.  She states her blood pressures at home and been running 130/90.  She's not been at bed rest, likely prescribed because her parents are elderly and have needs that she has to supply.  She is therefore, been ambulatory.  Last night.  She took an extra hundred milligrams of the beta blocker as her blood pressure is elevated.  However, drops her pulse below 50.  She has no syncope.   Review of Systems Negative    Objective:   Physical Exam    In general, she is a well-developed, well-nourished, female in no acute distress. BP right arm sitting position 150/90, pulse 60 and regular    Assessment & Plan  Hypertension,,,,,,,,, plan continue current medications she may add another 50 mg of the beta-blocker in the evening.  If her systolic blood pressures greater than 160.  Check a morning blood pressure daily.  Return in 3 weeks for follow-up

## 2010-03-18 NOTE — Patient Instructions (Signed)
Continue your current medications, however, if her systolic blood pressure is greater than 160 Take  an extra 50 mg of the beta-blocker p.r.n.  Check your blood pressure Twice daily.  Return in 3 weeks for follow-up, sooner if any problems

## 2010-03-23 NOTE — Assessment & Plan Note (Signed)
Summary: elevated BP/dm   Vital Signs:  Patient profile:   60 year old female Menstrual status:  postmenopausal Weight:      218 pounds Temp:     98.8 degrees F oral BP sitting:   202 / 108 Cuff size:   regular  Vitals Entered By: Kern Reap CMA Duncan Dull) (March 14, 2010 12:36 PM) CC: elevated blood pressure   CC:  elevated blood pressure.  History of Present Illness: Analysia is a 59 year old, married female, nonsmoker, who comes in today because of elevated blood pressure.  She takes the atenolol 100 mg daily, Lasix, 40 mg daily, lisinopril 80 mg daily, Norvasc, 5 mg daily, however, over the past couple weeks.  Her blood pressure has been creeping up.  Yesterday she was 190 over hundred.  Today is 210/110 right arm sitting position.  At 1245.  She was given .2 mg of clonidine and 10 mg of a beta-blocker.  BP monitored Q15 minutes x 4. She had no side effects from the medication   [place]'s blood pressure is 132 is 200 over 110 right arm..................at 2 p.m......Marland Kitchenher blood pressure dropped to 180 over hundred, left arm sitting position  Allergies (verified): No Known Drug Allergies  Past History:  Past medical, surgical, family and social histories (including risk factors) reviewed for relevance to current acute and chronic problems.  Past Medical History: Reviewed history from 12/23/2007 and no changes required. DUB Depression Hypertension Hyperlipidemia  Past Surgical History: Reviewed history from 08/28/2006 and no changes required. T&A BTL CB x 2  Family History: Reviewed history and no changes required.  Social History: Reviewed history from 08/28/2006 and no changes required. Married Never Smoked Alcohol use-no Drug use-no  Review of Systems      See HPI  Physical Exam  General:  Well-developed,well-nourished,in no acute distress; alert,appropriate and cooperative throughout examination Heart:   two, 10/110, right arm sitting  position...Marland KitchenMarland KitchenMarland Kitchenpulse 80 and regular   Impression & Recommendations:  Problem # 1:  HYPERTENSION (ICD-401.9) Assessment Deteriorated  The following medications were removed from the medication list:    Norvasc 5 Mg Tabs (Amlodipine besylate) .Marland Kitchen... Take one tab once daily Her updated medication list for this problem includes:    Atenolol 100 Mg Tabs (Atenolol) .Marland Kitchen... 1 tab qam & 1/2 by mouth at bedtime    Furosemide 40 Mg Tabs (Furosemide) .Marland Kitchen... Take 1 tablet by mouth every morning ; cpx when due    Lisinopril 40 Mg Tabs (Lisinopril) .Marland Kitchen... 2 by mouth qam    Norvasc 10 Mg Tabs (Amlodipine besylate) .Marland Kitchen... Take 1 tablet by mouth every morning  Orders: Venipuncture ([address]606) TLB-BMP (Basic Metabolic Panel-BMET) (80048-METABOL)  Complete Medication List: 1)  Effexor Xr 150 Mg Cp24 (Venlafaxine hcl) .... 2 q am 2)  Atenolol 100 Mg Tabs (Atenolol) .Marland Kitchen.. 1 tab qam & 1/2 by mouth at bedtime 3)  Furosemide 40 Mg Tabs (Furosemide) .... Take 1 tablet by mouth every morning ; cpx when due 4)  Zocor 40 Mg Tabs (Simvastatin) .Marland Kitchen.. 1 tab @ bedtime 5)  Lisinopril 40 Mg Tabs (Lisinopril) .... 2 by mouth qam 6)  Wellbutrin 75 Mg Tabs (Bupropion hcl) .Marland Kitchen.. 1 tab @ bedtime 7)  Acyclovir 400 Mg Tabs (Acyclovir) .... Take 1 tablet by mouth three times a day 8)  Flexeril 5 Mg Tabs (Cyclobenzaprine hcl) .... Take half tab by mouth three times a day as needed for pain 9)  Norvasc 10 Mg Tabs (Amlodipine besylate) .... Take 1 tablet by mouth every morning  Patient Instructions: 1)  continue the 100 mg of atenolol in the morning, but add a 50-mg dose at bedtime. 2)  Continue the Lasix 80 mg daily, lisinopril 80 mg daily, but increase the Norvasc to 10 mg daily. 3)  Bed rest today and tomorrow.  Follow-up Wednesday at 815. 4)  Complete salt free diet. 5)  Check your blood pressure 4 times a day Prescriptions: NORVASC 10 MG TABS (AMLODIPINE BESYLATE) Take 1 tablet by mouth every morning  #100 x 3   Entered and  Authorized by:   Roderick Pee MD   Signed by:   Roderick Pee MD on 03/14/2010   Method used:   [facility] to        [facility]  [address] [phone]* (retail)       [address]       [place], [place]  [address]       Ph: [phone]       Fax: [phone]   RxID:   [id number] ATENOLOL 100 MG  TABS (ATENOLOL) 1 tab qam & 1/2 by mouth at bedtime  #150 x 3   Entered and Authorized by:   Roderick Pee MD   Signed by:   Roderick Pee MD on 03/14/2010   Method used:   [facility]ron[facility]        [facility]  [address] [phone]* (retail)       [address]       [place], [place]  [address]       Ph: [phone]       Fax: [phone]   RxID:   [id number]    Orders Added: 1)  Venipuncture [30160] 2)  TLB-BMP (Basic Metabolic Panel-BMET) [80048-METABOL] 3)  Est. Patient Level IV [10932]

## 2010-03-23 NOTE — Progress Notes (Signed)
Summary: Call A Nurse   Call-A-Nurse Triage Call Report Triage Record Num: [id number] Operator: Frederico Hamman Patient Name: Sierra Banks Call Date & Time: 03/13/2010 1:04:41PM Patient Phone: [phone] PCP: Eugenio Hoes. Myles Tavella Patient Gender: Female PCP Fax : [phone] Patient DOB: 01-31-1951 [facility] Name[facility] Reason for Call: [name] states she had BP 195/112 @ 1230. "Feels foggy".Slightly lightheaded. No headache. No chest pain. Advised to be seen within next 4 hrs and follow up with office. Care advice given. Protocol(s) Used: Hypertension, Diagnosed or Suspected Recommended Outcome per Protocol: See Provider within 4 hours Reason for Outcome: Systolic BP of >180 mmHg OR diastolic BP of >120 mmHg Care Advice:  ~ Another adult should drive. Call EMS 911 if new symptoms develop, such as severe shortness of breath, chest pain, change in mental status, acute neurologic deficit, seizure, visual disturbances, pulse rate > 120 / minute, or very irregular pulse.  ~  ~ Call provider if symptoms worsen or new symptoms develop.  ~ HEALTH PROMOTION / MAINTENANCE  ~ List, or take, all current prescription(s), nonprescription or alternative medication(s) to provider for evaluation. 03/13/2010 1:22:46PM Page 1 of 1 [email or web address]

## 2010-03-23 NOTE — Assessment & Plan Note (Signed)
Summary: wed 8:15am fup/per Dr Todd/cjr   Allergies: No Known Drug Allergies   Complete Medication List: 1)  Effexor Xr 150 Mg Cp24 (Venlafaxine hcl) .... 2 q am 2)  Atenolol 100 Mg Tabs (Atenolol) .Marland Kitchen.. 1 tab qam & 1/2 by mouth at bedtime 3)  Furosemide 40 Mg Tabs (Furosemide) .... Take 1 tablet by mouth every morning ; cpx when due 4)  Zocor 40 Mg Tabs (Simvastatin) .Marland Kitchen.. 1 tab @ bedtime 5)  Lisinopril 40 Mg Tabs (Lisinopril) .... 2 by mouth qam 6)  Wellbutrin 75 Mg Tabs (Bupropion hcl) .Marland Kitchen.. 1 tab @ bedtime 7)  Acyclovir 400 Mg Tabs (Acyclovir) .... Take 1 tablet by mouth three times a day 8)  Flexeril 5 Mg Tabs (Cyclobenzaprine hcl) .... Take half tab by mouth three times a day as needed for pain 9)  Norvasc 10 Mg Tabs (Amlodipine besylate) .... Take 1 tablet by mouth every morning

## 2010-04-04 ENCOUNTER — Other Ambulatory Visit: Payer: Self-pay | Admitting: Family Medicine

## 2010-04-07 ENCOUNTER — Encounter: Payer: Self-pay | Admitting: Family Medicine

## 2010-04-07 ENCOUNTER — Ambulatory Visit (INDEPENDENT_AMBULATORY_CARE_PROVIDER_SITE_OTHER): Payer: 59 | Admitting: Family Medicine

## 2010-04-07 DIAGNOSIS — N950 Postmenopausal bleeding: Secondary | ICD-10-CM

## 2010-04-07 DIAGNOSIS — I10 Essential (primary) hypertension: Secondary | ICD-10-CM

## 2010-04-07 NOTE — Patient Instructions (Signed)
Continue your current blood pressure medication daily.  Monitor your blood pressure weekly.  Return to see me in two months for follow-up, sooner if any problems.  Call Dr. Arlyce Dice at [facility]      [phone]

## 2010-04-07 NOTE — Progress Notes (Signed)
  Subjective:    Patient ID: Sierra Banks, female    DOB: 11/29/1950, 60 y.o.   MRN: [id number]  HPIBeverly is a 60 year old female, who comes in today for follow-up of hypertension, and to address a new problem of postmenopausal vaginal bleeding.  She is taking 10 mg of Norvasc q.a.m., atenolol, 100 mg b.i.d., Lasix, 40 mg daily, lisinopril 40 mg daily BP now 130/80.  No side effects from medication.  For about two weeks.  She has a mid pubic cramping and then two days ago.  She started having vaginal bleeding.  Her last period was around 9 years ago.  GYN history unremarkable    Review of Systems      Negative Objective:   Physical Exam Well-developed well-nourished, female, is lightly, overweight.  No acute distress.  BP 130/70 left arm sitting position.  Abdominal exam was negative.  Pelvic examination.  Next genitalis normal limits.  Vaginal vault was normal.  The cervix is visualized.  There is blood coming out of the os.  Pap smear in November.  Normal       Assessment & Plan:  Hypertension under good control,,,,,,,,, continue current therapy.  Postmenopausal vaginal bleeding,,,,,,,,,,, referred to [facility] doctor Sierra Banks

## 2010-04-08 ENCOUNTER — Ambulatory Visit: Payer: 59 | Admitting: Family Medicine

## 2010-04-11 ENCOUNTER — Other Ambulatory Visit: Payer: Self-pay | Admitting: Family Medicine

## 2010-04-13 ENCOUNTER — Other Ambulatory Visit: Payer: Self-pay | Admitting: Obstetrics & Gynecology

## 2010-05-19 ENCOUNTER — Ambulatory Visit (INDEPENDENT_AMBULATORY_CARE_PROVIDER_SITE_OTHER): Payer: 59 | Admitting: Family Medicine

## 2010-05-19 ENCOUNTER — Encounter: Payer: Self-pay | Admitting: Family Medicine

## 2010-05-19 DIAGNOSIS — F329 Major depressive disorder, single episode, unspecified: Secondary | ICD-10-CM

## 2010-05-19 DIAGNOSIS — E663 Overweight: Secondary | ICD-10-CM

## 2010-05-19 MED ORDER — VENLAFAXINE HCL ER 150 MG PO CP24: ORAL_CAPSULE | ORAL | Status: DC

## 2010-05-19 NOTE — Progress Notes (Signed)
  Subjective:    Patient ID: Cristal Generous, female    DOB: 01-30-1951, 60 y.o.   MRN: [id number]  HPIBeverly is a 60 year old female, who comes in today for multiple issues.  She is currently on Effexor 300 mg a day.  However, she says her mood is low.  She has difficulty sleeping at night, and she's depressed and has no energy we discussed various options, which include increasing her current medication or seeing Dr. Nolen Mu for consult she would like to increase her medication for now and see if it doesn't help because in the past.  The medication did work.  Her weight is 221 pounds, and she wants something to cause her to lose weight.  Explained diet and exercise.  She continues to have low back pain, which radiates down her right hip.  Neurologic and musculoskeletal review of systems negative.  This is been a long-term chronic issue.    Review of Systems    General psychiatric, musculoskeletal, neurologic review of systems otherwise negative Objective:   Physical Exam    Well-developed obese female no acute distress.  She is alert and oriented, appropriate, not tearful.  Denies any suicidal ideation    Assessment & Plan:  Depression,,,,,,,,,, increase the Effexor to 450 mg daily,,,,,,,,, consult with Dr. Rolly Pancake, Nolen Mu if symptoms persist.  Obesity,,,,,,,,,, diet and exercise.  Low back pain,,,,,,,,,, Motrin 600 b.i.d. With food.  PT consult.  Symptoms persist

## 2010-05-19 NOTE — Patient Instructions (Signed)
Increase the Effexor to 450 mg daily.  Walk 30 minutes daily.  If you don't see any improvement in 4 to 6 weeks let's consult with Dr. Rolly Pancake, [facility].  Motrin 600 twice daily if you don't see any improvement in the back and hip pain after two to 3 weeks.  Call and we which is set up for physical therapy

## 2010-05-24 ENCOUNTER — Encounter (HOSPITAL_COMMUNITY)
Admission: RE | Admit: 2010-05-24 | Discharge: 2010-05-24 | Disposition: A | Discharge status: Home / Self Care | Payer: 59 | Source: Ambulatory Visit | Attending: Obstetrics & Gynecology | Admitting: Obstetrics & Gynecology

## 2010-05-24 LAB — BASIC METABOLIC PANEL
BUN: 10 mg/dL (ref 6–23)
CO2: 30 mEq/L (ref 19–32)
Calcium: 9.2 mg/dL (ref 8.4–10.5)
Chloride: 102 mEq/L (ref 96–112)
Creatinine, Ser: 0.93 mg/dL (ref 0.4–1.2)
GFR calc Af Amer: >60 mL/min (ref >60)
GFR calc non Af Amer: >60 mL/min (ref >60)
Glucose, Bld: 101 mg/dL — ABNORMAL HIGH (ref 70–99)
Potassium: 3.3 mEq/L — ABNORMAL LOW (ref 3.5–5.1)
Sodium: 140 mEq/L (ref 135–145)

## 2010-05-24 LAB — CBC
HCT: 38.4 % (ref 36.0–46.0)
Hemoglobin: 12.4 g/dL (ref 12.0–15.0)
MCH: 28.1 pg (ref 26.0–34.0)
MCHC: 32.3 g/dL (ref 30.0–36.0)
MCV: 87.1 fL (ref 78.0–100.0)
Platelets: 277 10*3/uL (ref 150–400)
RBC: 4.41 MIL/uL (ref 3.87–5.11)
RDW: 14.7 % (ref 11.5–15.5)
WBC: 8 10*3/uL (ref 4.0–10.5)

## 2010-05-31 ENCOUNTER — Other Ambulatory Visit: Payer: Self-pay | Admitting: Obstetrics & Gynecology

## 2010-05-31 ENCOUNTER — Ambulatory Visit (HOSPITAL_COMMUNITY)
Admission: RE | Admit: 2010-05-31 | Discharge: 2010-05-31 | Disposition: A | Discharge status: Home / Self Care | Payer: 59 | Source: Ambulatory Visit | Attending: Obstetrics & Gynecology | Admitting: Obstetrics & Gynecology

## 2010-05-31 DIAGNOSIS — N840 Polyp of corpus uteri: Secondary | ICD-10-CM | POA: Insufficient documentation

## 2010-05-31 DIAGNOSIS — N950 Postmenopausal bleeding: Secondary | ICD-10-CM | POA: Insufficient documentation

## 2010-05-31 DIAGNOSIS — Z01812 Encounter for preprocedural laboratory examination: Secondary | ICD-10-CM | POA: Insufficient documentation

## 2010-05-31 DIAGNOSIS — Z01818 Encounter for other preprocedural examination: Secondary | ICD-10-CM | POA: Insufficient documentation

## 2010-06-01 NOTE — Op Note (Addendum)
  Sierra Banks, Sierra Banks              ACCOUNT NO.:  [id number]  MEDICAL RECORD NO.:  [id number]           PATIENT TYPE:  O  LOCATION:  [facility]                           FACILITY:  [facility]  PHYSICIAN:  Ilda Mori, M.D.   DATE OF BIRTH:  1950-05-23  DATE OF PROCEDURE:  05/31/2010 DATE OF DISCHARGE:  05/24/2010                              OPERATIVE REPORT   PREOPERATIVE DIAGNOSES:  Endometrial polyp, postmenopausal bleeding.  POSTOPERATIVE DIAGNOSES:  Endometrial polyp, postmenopausal bleeding.  PROCEDURE:  Hysteroscopy, endometrial polypectomy, dilatation and curettage.  SURGEON:  Ilda Mori, MD.  ANESTHESIA:  General plus paracervical block.  FINDINGS:  A 2 cm endometrial polyp, scant endometrial curettings.  INDICATIONS:  This is a 60 year old female who was noted to have heavy postmenopausal bleeding.  Office Pipelle revealed benign tissue with fragments of endometrial polyp.  A sonohysterogram was performed which confirmed presence of an endometrial polyp and decision was made to proceed with polypectomy.  The patient was brought to the operating room and general anesthesia was induced.  She was placed in dorsal lithotomy position.  The vulva and vagina were prepped and draped in sterile fashion.  The bladder was catheterized.  A 10 mL of 2% lidocaine was infiltrated in the paracervical tissues.  The internal os was dilated with Pratt dilators to 25-French.  A diagnostic hysteroscope was introduced and the polyp was identified.  Hysteroscope was removed and a polyp forceps was placed and the polyp was grasped and removed without difficulty.  At this point, a sharp curettage was then carried out with scant tissue.  The hysteroscope was reintroduced and the endometrial cavity appeared symmetrical with no residual polyp or abnormal-appearing endometrium noted.  The procedure was then terminated and the patient left the operating room in good condition.     Ilda Mori,  M.D.     RK/MEDQ  D:  05/31/2010  T:  05/31/2010  Job:  [id number]  Electronically Signed by Ilda Mori M.D. on 06/07/2010 08:16:45 PM

## 2010-06-06 ENCOUNTER — Ambulatory Visit: Payer: 59 | Admitting: Family Medicine

## 2010-06-28 NOTE — Assessment & Plan Note (Signed)
[facility]                                 ON-CALL NOTE   NAME:HUGHESKetty, Bitton                       MRN:          [id number]  DATE:09/01/2006                            DOB:          [birth year]    I cannot remember whose patient she is, but I saw her in the clinic  earlier today.  Her phone number is [phone].  She was seen in the  clinic today with probable early kidney infection vs. UTI and has  suddenly gotten 102.2 fever.  She is only to start at the Cipro 500  b.i.d. that was given to her.  There is some headache and just feeling  bad but no vomiting and no rash.  I recommended that this is still  within the realm of early kidney infection and that if her fever is not  resolved in 48 hours she needs to be seen either way on Monday.  However, if she gets more severity, rash, vomiting, etc. that she call  back and may need re-evaluation earlier.     Neta Mends. Fabian Sharp, MD  Electronically Signed    WKP/MedQ  DD: 09/01/2006  DT: 09/01/2006  Job #: [id number]   cc:   Tinnie Gens A. Tawanna Cooler, MD

## 2010-07-01 NOTE — Op Note (Signed)
Sierra Banks, [place]              ACCOUNT NO.:  [id number]   MEDICAL RECORD NO.:  [id number]          PATIENT TYPE:  AMB   LOCATION:  [facility]                         FACILITY:  [facility]   PHYSICIAN:  Jene Every, M.D.    DATE OF BIRTH:  1950-12-31   DATE OF PROCEDURE:  12/28/2005  DATE OF DISCHARGE:                               OPERATIVE REPORT   PREOPERATIVE DIAGNOSIS:  Degenerative joint disease, left knee.   POSTOPERATIVE DIAGNOSES:  1. Degenerative joint disease, left knee.  2. Medial meniscus tear.  3. Lateral meniscus tear.  4. Grade 4, medial compartment, grade 3 chondromalacia, lateral      compartment, some minor grade 3 changes of the patella.   PROCEDURES:  1. Left knee arthroscopy.  2. Partial medial lateral meniscectomy.  3. Shaving, medial lateral compartment.   ANESTHESIA:  General.   No assistant.   BRIEF HISTORY AND INDICATIONS:  The patient is a 60 year old with knee  pain, osteoarthrosis history of a knee arthroscopy and debridement,  increasing pain refractory to conservative treatment.  Operative  intervention was indicated for debridement, diagnosis and treatment.  Risks and benefits discussed, including bleeding, infection, no change  in symptoms, worsening symptoms, need for repeat debridement, or total  knee arthroplasty in the future, etc.   TECHNIQUE:  The patient was placed in supine position.  After induction  of adequate general anesthesia and 1 g of Kefzol, the left lower  extremity was prepped and draped in the usual sterile fashion.  A  lateral parapatellar portal and a superior medial parapatellar portal  were fashioned with a #11 blade.  Ingress cannula atraumatically placed.  Irrigant was utilized to insufflate the joint.  Under direct  visualization a medial parapatellar portal was fashioned with a #11  blade after localization with an 18-gauge needle, sparing the medial  meniscus.  Noted immediately in the medial compartment was  extensive  grade 4 changes of the tibial plateau as well as the femoral condyle.  Torn meniscus and grade 3 chondral changes were noted as well and the  shaver was introduced and utilized to perform a chondroplasty of the  medial femoral condyle and tibial plateau and perform a partial medial  meniscectomy to a stable base.  A basket rongeur was used as well.  The  extensive grade 4 changes were not amenable to microfracture.  ACL and  PCL were unremarkable.  The lateral compartment revealed some grade 3  changes, and the chondroplasties were performed.  There was some  degenerative tearing of the inner rim of the meniscus in the posterior  two-thirds, and this was shaved with a 3.5 Cuda shaver to a stable base.  The remainder was stable to probe palpation.  There was normal  patellofemoral tracking, some minimal grade 2 and 3 changes of the  patella.  The gutters were unremarkable.  Revisited the medial  compartment, and the remnant was stable to probe palpation.  It was felt  that this patient would most likely require a total knee arthroplasty  near in the future.  Next the knee was copiously lavaged, all  instrumentation  was removed.  Portals were closed for 4-0 nylon simple  suture and 0.25%  Marcaine with epinephrine was infiltrated into the joint.  The wound was  dressed sterilely.  She was awoken without difficulty and transported to  the recovery room in satisfactory condition.  The patient tolerated the  procedure well with no complication.      Jene Every, M.D.  Electronically Signed     JB/MEDQ  D:  12/28/2005  T:  12/28/2005  Job:  [id number]

## 2010-07-01 NOTE — Op Note (Signed)
Sierra Banks, Sierra Banks              ACCOUNT NO.:  [id number]   MEDICAL RECORD NO.:  [id number]          PATIENT TYPE:  AMB   LOCATION:  DAY                          FACILITY:  [facility]   PHYSICIAN:  Jene Every, M.D.    DATE OF BIRTH:  [birth year]   DATE OF PROCEDURE:  12/03/2003  DATE OF DISCHARGE:                                 OPERATIVE REPORT   PREOPERATIVE DIAGNOSES:  1.  Medial meniscus tear.  2.  Degenerative joint disease, left knee.   POSTOPERATIVE DIAGNOSES:  1.  Medial meniscus tear.  2.  Degenerative joint disease, left knee.  3.  Grade 3 chondromalacia of the medial femoral condyle, medial tibial      plateau.   PROCEDURE PERFORMED:  Left knee arthroscopy, partial medial meniscectomy,  chondroplasty of the medial femoral condyle, medial tibial plateau.   ANESTHESIA:  General.   SURGEON:  Jene Every, M.D.   ASSISTANT:  None.   BRIEF HISTORY:  A 60 year old with refractory knee pain possible occult  meniscus tear. Operative intervention was indicated for diagnosis and  treatment. The risks and benefits were discussed including bleeding,  infection, injury to vascular structures,  DVT, no change in symptoms, need  for repeat debridement, etc.   TECHNIQUE:  The patient in supine position, after an adequate level of  general anesthesia, 1 g of Kefzol.  She was placed on the operative table,  left lower extremity was prepped and draped in the usual sterile fashion.  A  lateral parapatellar portal and a superomedial parapatellar portal was  fashioned with a #11 blade, Ingress cannula atraumatically placed, irrigant  was utilized to insufflate the joint. The camera was inserted laterally  under direct visualization, medial parapatellar portal was fashioned with a  #11 blade after localization with an 18 gauge needle, sparing the medial  meniscus.  Inspection revealed some minor changes of the patellofemoral  joint.  There is normal patellofemoral tracking, gutters  are unremarkable.  Examination of the intercondylar notch revealed normal ACL and PCL, there  was a bucket handle type tear of the medial meniscus, chondral flap tears of  the medial femoral condyle and end of the tibial plateau.  A basket rongeur  was introduced and utilized to perform a partial medial meniscectomy to a  stable base, further contoured with a 4.2 Kuda shaver, chondroplasty of the  femoral condyle with tibial plateau performed as well.  Electrocautery was  introduced and utilized to cauterize a region as the injury extended into  the vascular zone.  It was complex, multiple cleavage planes were therefore  irreparable.  Following this resection and contouring, it was stable to a  propalpation. I reduced the intraarticular pressures, no bleeding noted.  Examination of the lateral compartment revealed normal femoral condyle,  tibial plateau and meniscus stable to propalpation without evidence of tear  or chondral injury. The gutters were unremarkable and poorly lavaged, the  knee reinspected, the medial compartment was stable with no loose  cartilaginous debris. All instrumentation was removed, portals were closed  with 4-0 nylon simple sutures, 0.25% Marcaine with epinephrine was  infiltrated in  the  joint. The wound was dressed sterilely, she was awoken without difficulty  and transported to the recovery room in satisfactory condition.   The patient tolerated the procedure well with no complications.     Trey Paula   JB/MEDQ  D:  12/03/2003  T:  12/03/2003  Job:  [id number]

## 2010-07-08 ENCOUNTER — Encounter: Payer: Self-pay | Admitting: Internal Medicine

## 2010-07-08 ENCOUNTER — Ambulatory Visit (INDEPENDENT_AMBULATORY_CARE_PROVIDER_SITE_OTHER): Payer: 59 | Admitting: Internal Medicine

## 2010-07-08 VITALS — BP 132/80 | HR 78 | Temp 98.3°F | Wt 216.0 lb

## 2010-07-08 DIAGNOSIS — I10 Essential (primary) hypertension: Secondary | ICD-10-CM

## 2010-07-08 DIAGNOSIS — R002 Palpitations: Secondary | ICD-10-CM

## 2010-07-08 DIAGNOSIS — E785 Hyperlipidemia, unspecified: Secondary | ICD-10-CM

## 2010-07-08 DIAGNOSIS — F329 Major depressive disorder, single episode, unspecified: Secondary | ICD-10-CM

## 2010-07-08 MED ORDER — TECHNETIUM TC 99M SESTAMIBI - CARDIOLITE: INTRAVENOUS | Status: DC

## 2010-07-08 NOTE — Patient Instructions (Signed)
Increase atenolol to 100 mg twice daily  Report to the emergency department if he developed sustained palpitations or become symptomatic with shortness of breath lightheadedness or chest pain  Stress test and Holter monitor as discussed  Avoid caffeine use

## 2010-07-08 NOTE — Progress Notes (Signed)
  Subjective:    Patient ID: Sierra Banks, female    DOB: 07/30/1950, 60 y.o.   MRN: [id number]  HPI  60 year old patient who is seen today with a chief complaint of palpitations. These have occurred for the past 3 days. She describes rapid forceful regular palpitations with abrupt onset and slow termination. Yesterday's occurred one or 2 times per hour lasting approximately 2 minutes today they have occurred 2-3 times per hour lasting the same 2 minutes. Associated symptoms included lightheadedness. There's been no chest pain or shortness of breath. Palpitations or paroxysmal and not related to any activities. She does not consume coffee but she does consume a large [employer] iced tea daily medical regimen includes the atenolol for blood pressure control she is on both Wellbutrin and Effexor for depression    Review of Systems  Constitutional: Negative.   HENT: Negative for hearing loss, congestion, sore throat, rhinorrhea, dental problem, sinus pressure and tinnitus.   Eyes: Negative for pain, discharge and visual disturbance.  Respiratory: Negative for cough and shortness of breath.   Cardiovascular: Positive for palpitations. Negative for chest pain and leg swelling.  Gastrointestinal: Negative for nausea, vomiting, abdominal pain, diarrhea, constipation, blood in stool and abdominal distention.  Genitourinary: Negative for dysuria, urgency, frequency, hematuria, flank pain, vaginal bleeding, vaginal discharge, difficulty urinating, vaginal pain and pelvic pain.  Musculoskeletal: Negative for joint swelling, arthralgias and gait problem.  Skin: Negative for rash.  Neurological: Positive for light-headedness. Negative for dizziness, syncope, speech difficulty, weakness, numbness and headaches.  Hematological: Negative for adenopathy.  Psychiatric/Behavioral: Negative for behavioral problems, dysphoric mood and agitation. The patient is not nervous/anxious.        Objective:   Physical  Exam  Constitutional: She is oriented to person, place, and time. She appears well-developed and well-nourished.       Obese no distress Blood pressure 160/90 right arm slightly lower on the left  HENT:  Head: Normocephalic.  Right Ear: External ear normal.  Left Ear: External ear normal.  Mouth/Throat: Oropharynx is clear and moist.  Eyes: Conjunctivae and EOM are normal. Pupils are equal, round, and reactive to light.  Neck: Normal range of motion. Neck supple. No thyromegaly present.  Cardiovascular: Normal rate, regular rhythm, normal heart sounds and intact distal pulses.   No murmur heard. Pulmonary/Chest: Effort normal and breath sounds normal.  Abdominal: Soft. Bowel sounds are normal. She exhibits no mass. There is no tenderness.  Musculoskeletal: Normal range of motion.  Lymphadenopathy:    She has no cervical adenopathy.  Neurological: She is alert and oriented to person, place, and time.  Skin: Skin is warm and dry. No rash noted.  Psychiatric: She has a normal mood and affect. Her behavior is normal.          Assessment & Plan:   Palpitations. EKG reveals LVH poor R-wave progression and occasional PVC.  Hypertension  Dyslipidemia   We'll observe the patient over the next 30-60 minutes and how to capture her tachyarrhythmia. We'll increase her Tenormin to 100 mg twice a day. We'll set up for a Cardiolite stress test to assess LV function wall motion abnormalities and perfusion defects. We'll consider a Holter monitor

## 2010-07-15 ENCOUNTER — Encounter (INDEPENDENT_AMBULATORY_CARE_PROVIDER_SITE_OTHER): Payer: 59

## 2010-07-15 DIAGNOSIS — R002 Palpitations: Secondary | ICD-10-CM

## 2010-10-27 ENCOUNTER — Encounter: Payer: Self-pay | Admitting: Family Medicine

## 2010-10-27 ENCOUNTER — Ambulatory Visit (INDEPENDENT_AMBULATORY_CARE_PROVIDER_SITE_OTHER): Payer: 59 | Admitting: Family Medicine

## 2010-10-27 DIAGNOSIS — L918 Other hypertrophic disorders of the skin: Secondary | ICD-10-CM | POA: Insufficient documentation

## 2010-10-27 DIAGNOSIS — L909 Atrophic disorder of skin, unspecified: Secondary | ICD-10-CM

## 2010-10-27 NOTE — Progress Notes (Signed)
  Subjective:    Patient ID: Cristal Generous, female    DOB: [birth year], 60 y.o.   MRN: [id number]  HPI Leita is a 60 year old, married female, nonsmoker, who comes in today for removal of a lesion on her right upper eyelid.  She has a skin tag is present and, now it's increasing in size.  She would like it removed.  After informed consent, the lesion was anesthetized with 1% Xylocaine with epinephrine and removed.  The base was cauterized.  She tolerated the procedure well.  No complications  Review of Systems    See above.  Negative Objective:   Physical Exam Skin tag type lesion, right upper, mid eyelid removed C. Procedure above       Assessment & Plan:  Skin tag, right upper eyelid removed

## 2010-10-27 NOTE — Patient Instructions (Signed)
Keep ice on that eye for 15 to 20 minutes when you get home.  Return p.r.n.

## 2010-10-31 ENCOUNTER — Other Ambulatory Visit: Payer: Self-pay | Admitting: Family Medicine

## 2010-10-31 NOTE — Telephone Encounter (Signed)
Pt is asking to speak to [facility], please.

## 2010-10-31 NOTE — Telephone Encounter (Signed)
Patient blood pressure is elevated.  Appointment made.

## 2010-11-01 ENCOUNTER — Ambulatory Visit (INDEPENDENT_AMBULATORY_CARE_PROVIDER_SITE_OTHER): Payer: 59 | Admitting: Family Medicine

## 2010-11-01 DIAGNOSIS — I10 Essential (primary) hypertension: Secondary | ICD-10-CM

## 2010-11-01 LAB — POCT URINALYSIS DIPSTICK
Bilirubin, UA: NEGATIVE
Blood, UA: NEGATIVE
Glucose, UA: NEGATIVE
Ketones, UA: NEGATIVE
Leukocytes, UA: NEGATIVE
Nitrite, UA: NEGATIVE
Protein, UA: NEGATIVE
Spec Grav, UA: 1.01
Urobilinogen, UA: 0.2
pH, UA: 7

## 2010-11-01 LAB — BASIC METABOLIC PANEL
BUN: 14 mg/dL (ref 6–23)
CO2: 32 mEq/L (ref 19–32)
Calcium: 9.4 mg/dL (ref 8.4–10.5)
Chloride: 99 mEq/L (ref 96–112)
Creatinine, Ser: 1 mg/dL (ref 0.4–1.2)
GFR: 60.61 mL/min (ref >60.00)
Glucose, Bld: 116 mg/dL — ABNORMAL HIGH (ref 70–99)
Potassium: 3.3 mEq/L — ABNORMAL LOW (ref 3.5–5.1)
Sodium: 140 mEq/L (ref 135–145)

## 2010-11-01 LAB — CBC WITH DIFFERENTIAL/PLATELET
Basophils Absolute: 0 10*3/uL (ref 0.0–0.1)
Basophils Relative: 0.5 % (ref 0.0–3.0)
Eosinophils Absolute: 0.3 10*3/uL (ref 0.0–0.7)
Eosinophils Relative: 3 % (ref 0.0–5.0)
HCT: 40.5 % (ref 36.0–46.0)
Hemoglobin: 13.4 g/dL (ref 12.0–15.0)
Lymphocytes Relative: 23.7 % (ref 12.0–46.0)
Lymphs Abs: 2.4 10*3/uL (ref 0.7–4.0)
MCHC: 33.2 g/dL (ref 30.0–36.0)
MCV: 87.8 fl (ref 78.0–100.0)
Monocytes Absolute: 0.7 10*3/uL (ref 0.1–1.0)
Monocytes Relative: 7.2 % (ref 3.0–12.0)
Neutro Abs: 6.5 10*3/uL (ref 1.4–7.7)
Neutrophils Relative %: 65.6 % (ref 43.0–77.0)
Platelets: 301 10*3/uL (ref 150.0–400.0)
RBC: 4.61 Mil/uL (ref 3.87–5.11)
RDW: 14.4 % (ref 11.5–14.6)
WBC: 10 10*3/uL (ref 4.5–10.5)

## 2010-11-01 LAB — HEPATIC FUNCTION PANEL
ALT: 31 U/L (ref 0–35)
AST: 30 U/L (ref 0–37)
Albumin: 4.3 g/dL (ref 3.5–5.2)
Alkaline Phosphatase: 80 U/L (ref 39–117)
Bilirubin, Direct: 0 mg/dL (ref 0.0–0.3)
Total Bilirubin: 0.7 mg/dL (ref 0.3–1.2)
Total Protein: 7.5 g/dL (ref 6.0–8.3)

## 2010-11-01 LAB — TSH: TSH: 1.54 u[IU]/mL (ref 0.35–5.50)

## 2010-11-01 MED ORDER — POTASSIUM CHLORIDE CRYS ER 20 MEQ PO TBCR: 20.0000 meq | EXTENDED_RELEASE_TABLET | Freq: Two times a day (BID) | ORAL | Status: DC

## 2010-11-01 MED ORDER — AMLODIPINE BESYLATE 5 MG PO TABS: 5.0000 mg | ORAL_TABLET | Freq: Every day | ORAL | Status: DC

## 2010-11-02 ENCOUNTER — Encounter: Payer: Self-pay | Admitting: Family Medicine

## 2010-11-03 ENCOUNTER — Encounter: Payer: Self-pay | Admitting: Family Medicine

## 2010-11-03 ENCOUNTER — Ambulatory Visit (INDEPENDENT_AMBULATORY_CARE_PROVIDER_SITE_OTHER): Payer: 59 | Admitting: Family Medicine

## 2010-11-03 DIAGNOSIS — I10 Essential (primary) hypertension: Secondary | ICD-10-CM

## 2010-11-03 NOTE — Progress Notes (Signed)
  Subjective:    Patient ID: Sierra Banks, female    DOB: August 23, 1950, 60 y.o.   MRN: [id number]  HPI Sierra Banks is a 60 year old, married female and on smoker, who returns today for follow-up of malignant hypertension.  She was seen two days ago and having been off her medication.  Her BP was 200/110.  She was given clonidine and Toprol here in the office.  BP was monitored every 15 minutes and began to drop.  She was asymptomatic.  BP when she left was 170/90.  We placed her at bedrest at home and added 10 mg of Norvasc and doubled her Lasix to 40 mg b.i.d.  She comes back today for follow-up.  BP today 140/90 right arm sitting position.  We added 120 mEq potassium supplement because her potassium was 3.3   Review of Systems General and cardiovascular and neurologic review of systems otherwise negative   Objective:   Physical Exam  Well-developed well-nourished, female, in no acute distress.  BP right arm sitting position.  After 15 minutes of rest 140/90, pulse 70 and regular      Assessment & Plan:  Hypertension improved.  Plan continue current medications except increase Norvasc to 10 mg daily decrease Lasix to 40 daily follow-up in two weeks

## 2010-11-03 NOTE — Patient Instructions (Signed)
Increase the Norvasc to 10 mg daily.  Decrease the Lasix to one tablet daily in the morning.  Continue the atenolol, lisinopril, potassium,  Check your blood pressure daily in the morning.  Return in two weeks for follow-up

## 2010-11-17 ENCOUNTER — Ambulatory Visit: Payer: 59 | Admitting: Family Medicine

## 2010-12-07 NOTE — Progress Notes (Signed)
System connectivity issues. See scanned document for additional office visit information.

## 2010-12-31 ENCOUNTER — Other Ambulatory Visit: Payer: Self-pay | Admitting: Family Medicine

## 2011-01-27 ENCOUNTER — Encounter: Payer: Self-pay | Admitting: Family Medicine

## 2011-01-27 LAB — HM MAMMOGRAPHY

## 2011-03-14 ENCOUNTER — Other Ambulatory Visit: Payer: Self-pay | Admitting: *Deleted

## 2011-03-14 MED ORDER — SIMVASTATIN 40 MG PO TABS: 40.0000 mg | ORAL_TABLET | Freq: Every day | ORAL | Status: DC

## 2011-03-20 ENCOUNTER — Other Ambulatory Visit (INDEPENDENT_AMBULATORY_CARE_PROVIDER_SITE_OTHER): Payer: 59

## 2011-03-20 DIAGNOSIS — Z0000 Encounter for general adult medical examination without abnormal findings: Secondary | ICD-10-CM

## 2011-03-20 LAB — BASIC METABOLIC PANEL
BUN: 17 mg/dL (ref 6–23)
CO2: 31 mEq/L (ref 19–32)
Calcium: 9.7 mg/dL (ref 8.4–10.5)
Chloride: 104 mEq/L (ref 96–112)
Creatinine, Ser: 1.1 mg/dL (ref 0.4–1.2)
GFR: 55.95 mL/min — ABNORMAL LOW (ref >60.00)
Glucose, Bld: 95 mg/dL (ref 70–99)
Potassium: 4.9 mEq/L (ref 3.5–5.1)
Sodium: 143 mEq/L (ref 135–145)

## 2011-03-20 LAB — POCT URINALYSIS DIPSTICK
Bilirubin, UA: NEGATIVE
Glucose, UA: NEGATIVE
Leukocytes, UA: NEGATIVE
Nitrite, UA: NEGATIVE
Spec Grav, UA: 1.015
Urobilinogen, UA: 0.2
pH, UA: 7

## 2011-03-20 LAB — CBC WITH DIFFERENTIAL/PLATELET
Basophils Absolute: 0 10*3/uL (ref 0.0–0.1)
Basophils Relative: 0.6 % (ref 0.0–3.0)
Eosinophils Absolute: 0.2 10*3/uL (ref 0.0–0.7)
Eosinophils Relative: 2.4 % (ref 0.0–5.0)
HCT: 39 % (ref 36.0–46.0)
Hemoglobin: 13.2 g/dL (ref 12.0–15.0)
Lymphocytes Relative: 27 % (ref 12.0–46.0)
Lymphs Abs: 2.1 10*3/uL (ref 0.7–4.0)
MCHC: 33.9 g/dL (ref 30.0–36.0)
MCV: 87.5 fl (ref 78.0–100.0)
Monocytes Absolute: 0.6 10*3/uL (ref 0.1–1.0)
Monocytes Relative: 7.4 % (ref 3.0–12.0)
Neutro Abs: 4.9 10*3/uL (ref 1.4–7.7)
Neutrophils Relative %: 62.6 % (ref 43.0–77.0)
Platelets: 293 10*3/uL (ref 150.0–400.0)
RBC: 4.46 Mil/uL (ref 3.87–5.11)
RDW: 13.8 % (ref 11.5–14.6)
WBC: 7.8 10*3/uL (ref 4.5–10.5)

## 2011-03-20 LAB — HEPATIC FUNCTION PANEL
ALT: 24 U/L (ref 0–35)
AST: 24 U/L (ref 0–37)
Albumin: 4.1 g/dL (ref 3.5–5.2)
Alkaline Phosphatase: 82 U/L (ref 39–117)
Bilirubin, Direct: 0 mg/dL (ref 0.0–0.3)
Total Bilirubin: 0.5 mg/dL (ref 0.3–1.2)
Total Protein: 7.4 g/dL (ref 6.0–8.3)

## 2011-03-20 LAB — LIPID PANEL
Cholesterol: 180 mg/dL (ref 0–200)
HDL: 50.6 mg/dL (ref >39.00)
LDL Cholesterol: 98 mg/dL (ref 0–99)
Total CHOL/HDL Ratio: 4
Triglycerides: 156 mg/dL — ABNORMAL HIGH (ref 0.0–149.0)
VLDL: 31.2 mg/dL (ref 0.0–40.0)

## 2011-03-20 LAB — TSH: TSH: 1.49 u[IU]/mL (ref 0.35–5.50)

## 2011-03-21 ENCOUNTER — Other Ambulatory Visit: Payer: 59

## 2011-03-28 ENCOUNTER — Ambulatory Visit (INDEPENDENT_AMBULATORY_CARE_PROVIDER_SITE_OTHER): Payer: 59 | Admitting: Family Medicine

## 2011-03-28 ENCOUNTER — Other Ambulatory Visit (HOSPITAL_COMMUNITY)
Admission: RE | Admit: 2011-03-28 | Discharge: 2011-03-28 | Disposition: A | Discharge status: Home / Self Care | Payer: 59 | Source: Ambulatory Visit | Attending: Family Medicine | Admitting: Family Medicine

## 2011-03-28 ENCOUNTER — Other Ambulatory Visit: Payer: Self-pay | Admitting: Family Medicine

## 2011-03-28 ENCOUNTER — Encounter: Payer: Self-pay | Admitting: Family Medicine

## 2011-03-28 DIAGNOSIS — E785 Hyperlipidemia, unspecified: Secondary | ICD-10-CM

## 2011-03-28 DIAGNOSIS — Z0000 Encounter for general adult medical examination without abnormal findings: Secondary | ICD-10-CM

## 2011-03-28 DIAGNOSIS — F329 Major depressive disorder, single episode, unspecified: Secondary | ICD-10-CM

## 2011-03-28 DIAGNOSIS — Z01419 Encounter for gynecological examination (general) (routine) without abnormal findings: Secondary | ICD-10-CM | POA: Insufficient documentation

## 2011-03-28 DIAGNOSIS — I10 Essential (primary) hypertension: Secondary | ICD-10-CM

## 2011-03-28 DIAGNOSIS — E663 Overweight: Secondary | ICD-10-CM

## 2011-03-28 DIAGNOSIS — F3289 Other specified depressive episodes: Secondary | ICD-10-CM

## 2011-03-28 MED ORDER — LISINOPRIL 40 MG PO TABS: 40.0000 mg | ORAL_TABLET | Freq: Two times a day (BID) | ORAL | Status: DC

## 2011-03-28 MED ORDER — VENLAFAXINE HCL ER 150 MG PO CP24: 150.0000 mg | ORAL_CAPSULE | Freq: Two times a day (BID) | ORAL | Status: DC

## 2011-03-28 MED ORDER — ATENOLOL 100 MG PO TABS: 100.0000 mg | ORAL_TABLET | Freq: Every day | ORAL | Status: DC

## 2011-03-28 MED ORDER — AMLODIPINE BESYLATE 5 MG PO TABS: 5.0000 mg | ORAL_TABLET | Freq: Every day | ORAL | Status: DC

## 2011-03-28 MED ORDER — ACYCLOVIR 400 MG PO TABS: 400.0000 mg | ORAL_TABLET | Freq: Three times a day (TID) | ORAL | Status: DC

## 2011-03-28 MED ORDER — BUPROPION HCL 75 MG PO TABS: 75.0000 mg | ORAL_TABLET | ORAL | Status: DC

## 2011-03-28 MED ORDER — SIMVASTATIN 40 MG PO TABS: 40.0000 mg | ORAL_TABLET | Freq: Every day | ORAL | Status: DC

## 2011-03-28 NOTE — Patient Instructions (Signed)
Continue your current medications except hold the Lasix and the potassium  Return in one week for a nonfasting bmet  Check your blood pressure daily in the morning  Once you get to the surgery you might want to consider the [employer] weight loss program  Return in one year for general physical sooner if any problems

## 2011-03-28 NOTE — Progress Notes (Signed)
  Subjective:    Patient ID: Sierra Banks, female    DOB: November 11, 1950, 61 y.o.   MRN: [id number]  HPI Sierra Banks is a 61 year old female married nonsmoker who comes in today for general physical examination because of a history of hypertension, depression, hyperlipidemia, obesity, degenerative joint disease.  She takes Norvasc 5 mg daily along with Tenormin 100 mg daily and Lasix 40 mg daily and lisinopril 80 mg daily for hypertension BP today 120/80  She takes acyclovir when necessary when she has an outbreak of HSV  She takes Zocor 40 mg daily for hyperlipidemia and an aspirin tablet  She also takes Effexor 300 mg daily along with Wellbutrin 75 mg daily for depression  She continues to struggle with her weight is now 217 pounds. At one time she went to [employer] and lost a fair amount of weight. Encouraged her to go back to [employer]  This spring she can have her left knee replaced for severe degenerative joint disease  She gets routine eye care, dental care, BSE monthly, and you mammography, colonoscopy normal in GI, tetanus 2005, information given on shingles and   Review of Systems  Constitutional: Negative.   HENT: Negative.   Eyes: Negative.   Respiratory: Negative.   Cardiovascular: Negative.   Gastrointestinal: Negative.   Genitourinary: Negative.   Musculoskeletal: Negative.   Neurological: Negative.   Hematological: Negative.   Psychiatric/Behavioral: Negative.        Objective:   Physical Exam  Constitutional: She appears well-developed and well-nourished.  HENT:  Head: Normocephalic and atraumatic.  Right Ear: External ear normal.  Left Ear: External ear normal.  Nose: Nose normal.  Mouth/Throat: Oropharynx is clear and moist.  Eyes: EOM are normal. Pupils are equal, round, and reactive to light.  Neck: Normal range of motion. Neck supple. No thyromegaly present.  Cardiovascular: Normal rate, regular rhythm, normal heart sounds and intact distal  pulses.  Exam reveals no gallop and no friction rub.   No murmur heard. Pulmonary/Chest: Effort normal and breath sounds normal.  Abdominal: Soft. Bowel sounds are normal. She exhibits no distension and no mass. There is no tenderness. There is no rebound.  Genitourinary: Vagina normal and uterus normal. Guaiac negative stool. No vaginal discharge found.  Musculoskeletal: Normal range of motion.  Lymphadenopathy:    She has no cervical adenopathy.  Neurological: She is alert. She has normal reflexes. No cranial nerve deficit. She exhibits normal muscle tone. Coordination normal.  Skin: Skin is warm and dry.  Psychiatric: She has a normal mood and affect. Her behavior is normal. Judgment and thought content normal.          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

## 2011-04-04 ENCOUNTER — Other Ambulatory Visit: Payer: 59

## 2011-04-05 ENCOUNTER — Other Ambulatory Visit: Payer: 59

## 2011-04-05 DIAGNOSIS — I10 Essential (primary) hypertension: Secondary | ICD-10-CM

## 2011-04-05 LAB — BASIC METABOLIC PANEL
BUN: 16 mg/dL (ref 6–23)
CO2: 29 mEq/L (ref 19–32)
Calcium: 9.6 mg/dL (ref 8.4–10.5)
Chloride: 106 mEq/L (ref 96–112)
Creatinine, Ser: 0.9 mg/dL (ref 0.4–1.2)
GFR: 64.26 mL/min (ref >60.00)
Glucose, Bld: 94 mg/dL (ref 70–99)
Potassium: 4.8 mEq/L (ref 3.5–5.1)
Sodium: 145 mEq/L (ref 135–145)

## 2011-04-06 ENCOUNTER — Other Ambulatory Visit: Payer: Self-pay | Admitting: Family Medicine

## 2011-04-13 ENCOUNTER — Other Ambulatory Visit: Payer: Self-pay | Admitting: Family Medicine

## 2011-04-24 ENCOUNTER — Encounter (HOSPITAL_COMMUNITY): Payer: Self-pay | Admitting: Pharmacy Technician

## 2011-04-24 ENCOUNTER — Other Ambulatory Visit: Payer: Self-pay | Admitting: Family Medicine

## 2011-04-27 ENCOUNTER — Ambulatory Visit (HOSPITAL_COMMUNITY)
Admission: RE | Admit: 2011-04-27 | Discharge: 2011-04-27 | Disposition: A | Discharge status: Home / Self Care | Payer: 59 | Source: Ambulatory Visit | Attending: Orthopedic Surgery | Admitting: Orthopedic Surgery

## 2011-04-27 ENCOUNTER — Other Ambulatory Visit: Payer: Self-pay | Admitting: Orthopedic Surgery

## 2011-04-27 ENCOUNTER — Encounter (HOSPITAL_COMMUNITY): Payer: Self-pay

## 2011-04-27 ENCOUNTER — Encounter (HOSPITAL_COMMUNITY)
Admission: RE | Admit: 2011-04-27 | Discharge: 2011-04-27 | Disposition: A | Discharge status: Home / Self Care | Payer: 59 | Source: Ambulatory Visit | Attending: Orthopedic Surgery | Admitting: Orthopedic Surgery

## 2011-04-27 DIAGNOSIS — K219 Gastro-esophageal reflux disease without esophagitis: Secondary | ICD-10-CM | POA: Insufficient documentation

## 2011-04-27 DIAGNOSIS — I499 Cardiac arrhythmia, unspecified: Secondary | ICD-10-CM | POA: Insufficient documentation

## 2011-04-27 DIAGNOSIS — Z01810 Encounter for preprocedural cardiovascular examination: Secondary | ICD-10-CM | POA: Insufficient documentation

## 2011-04-27 DIAGNOSIS — R05 Cough: Secondary | ICD-10-CM | POA: Insufficient documentation

## 2011-04-27 DIAGNOSIS — R059 Cough, unspecified: Secondary | ICD-10-CM | POA: Insufficient documentation

## 2011-04-27 DIAGNOSIS — I10 Essential (primary) hypertension: Secondary | ICD-10-CM | POA: Insufficient documentation

## 2011-04-27 DIAGNOSIS — Z01818 Encounter for other preprocedural examination: Secondary | ICD-10-CM | POA: Insufficient documentation

## 2011-04-27 DIAGNOSIS — Z01812 Encounter for preprocedural laboratory examination: Secondary | ICD-10-CM | POA: Insufficient documentation

## 2011-04-27 DIAGNOSIS — I517 Cardiomegaly: Secondary | ICD-10-CM | POA: Insufficient documentation

## 2011-04-27 HISTORY — DX: Cardiac arrhythmia, unspecified: I49.9

## 2011-04-27 HISTORY — DX: Gastro-esophageal reflux disease without esophagitis: K21.9

## 2011-04-27 HISTORY — DX: Inflammatory liver disease, unspecified: K75.9

## 2011-04-27 LAB — DIFFERENTIAL
Basophils Absolute: 0.1 10*3/uL (ref 0.0–0.1)
Basophils Relative: 1 % (ref 0–1)
Eosinophils Absolute: 0.2 10*3/uL (ref 0.0–0.7)
Eosinophils Relative: 2 % (ref 0–5)
Lymphocytes Relative: 33 % (ref 12–46)
Lymphs Abs: 2.6 10*3/uL (ref 0.7–4.0)
Monocytes Absolute: 0.5 10*3/uL (ref 0.1–1.0)
Monocytes Relative: 6 % (ref 3–12)
Neutro Abs: 4.7 10*3/uL (ref 1.7–7.7)
Neutrophils Relative %: 59 % (ref 43–77)

## 2011-04-27 LAB — COMPREHENSIVE METABOLIC PANEL
ALT: 27 U/L (ref 0–35)
AST: 22 U/L (ref 0–37)
Albumin: 4.1 g/dL (ref 3.5–5.2)
Alkaline Phosphatase: 85 U/L (ref 39–117)
BUN: 13 mg/dL (ref 6–23)
CO2: 31 mEq/L (ref 19–32)
Calcium: 9.9 mg/dL (ref 8.4–10.5)
Chloride: 101 mEq/L (ref 96–112)
Creatinine, Ser: 0.88 mg/dL (ref 0.50–1.10)
GFR calc Af Amer: 81 mL/min — ABNORMAL LOW (ref >90)
GFR calc non Af Amer: 69 mL/min — ABNORMAL LOW (ref >90)
Glucose, Bld: 96 mg/dL (ref 70–99)
Potassium: 4 mEq/L (ref 3.5–5.1)
Sodium: 141 mEq/L (ref 135–145)
Total Bilirubin: 0.4 mg/dL (ref 0.3–1.2)
Total Protein: 7.5 g/dL (ref 6.0–8.3)

## 2011-04-27 LAB — PROTIME-INR
INR: 0.91 (ref 0.00–1.49)
Prothrombin Time: 12.5 seconds (ref 11.6–15.2)

## 2011-04-27 LAB — URINALYSIS, ROUTINE W REFLEX MICROSCOPIC
Bilirubin Urine: NEGATIVE
Glucose, UA: NEGATIVE mg/dL
Hgb urine dipstick: NEGATIVE
Ketones, ur: NEGATIVE mg/dL
Leukocytes, UA: NEGATIVE
Nitrite: NEGATIVE
Protein, ur: NEGATIVE mg/dL
Specific Gravity, Urine: 1.011 (ref 1.005–1.030)
Urobilinogen, UA: 0.2 mg/dL (ref 0.0–1.0)
pH: 6 (ref 5.0–8.0)

## 2011-04-27 LAB — SURGICAL PCR SCREEN
MRSA, PCR: NEGATIVE
Staphylococcus aureus: NEGATIVE

## 2011-04-27 LAB — CBC
HCT: 39.4 % (ref 36.0–46.0)
Hemoglobin: 13.5 g/dL (ref 12.0–15.0)
MCH: 29 pg (ref 26.0–34.0)
MCHC: 34.3 g/dL (ref 30.0–36.0)
MCV: 84.5 fL (ref 78.0–100.0)
Platelets: 339 10*3/uL (ref 150–400)
RBC: 4.66 MIL/uL (ref 3.87–5.11)
RDW: 14 % (ref 11.5–15.5)
WBC: 8.1 10*3/uL (ref 4.0–10.5)

## 2011-04-27 LAB — APTT: aPTT: 29 seconds (ref 24–37)

## 2011-04-27 LAB — ABO/RH: ABO/RH(D): A NEG

## 2011-04-27 NOTE — Pre-Procedure Instructions (Signed)
[name]  04/27/2011   Your procedure is scheduled on:   Monday  05/01/11    Report to [facility] at 645 AM.  Call this number if you have problems the morning of surgery: [phone]   Remember:   Do not eat food:After Midnight.  May have clear liquids: up to 4 Hours before arrival.  Clear liquids include soda, tea, black coffee, apple or grape juice, broth.  Take these medicines the morning of surgery with A SIP OF WATER:  NORVASC, ATENOLOL(TENORMIN), WELLBUTRIN, EFFEXOR    Do not wear jewelry, make-up or nail polish.  Do not wear lotions, powders, or perfumes. You may wear deodorant.  Do not shave 48 hours prior to surgery.  Do not bring valuables to the hospital.  Contacts, dentures or bridgework may not be worn into surgery.  Leave suitcase in the car. After surgery it may be brought to your room.  For patients admitted to the hospital, checkout time is 11:00 AM the day of discharge.   Patients discharged the day of surgery will not be allowed to drive home.  Name and phone number of your driver:   Special Instructions: CHG Shower Use Special Wash: 1/2 bottle night before surgery and 1/2 bottle morning of surgery.   Please read over the following fact sheets that you were given: Pain Booklet, Total Joint Packet, MRSA Information and Surgical Site Infection Prevention

## 2011-04-28 LAB — URINE CULTURE
Colony Count: NO GROWTH
Culture  Setup Time: 201303141307
Culture: NO GROWTH

## 2011-05-07 MED ORDER — CHLORHEXIDINE GLUCONATE 4 % EX LIQD: 60.0000 mL | Freq: Once | CUTANEOUS | Status: DC

## 2011-05-07 MED ORDER — ACETAMINOPHEN 10 MG/ML IV SOLN
1000.0000 mg | Freq: Four times a day (QID) | INTRAVENOUS | Status: DC
  Administered 2011-05-08: 1000 mg via INTRAVENOUS
  Filled 2011-05-07 (×4): qty 100

## 2011-05-07 MED ORDER — CEFAZOLIN SODIUM-DEXTROSE 2-3 GM-% IV SOLR
2.0000 g | INTRAVENOUS | Status: AC
  Administered 2011-05-08: 2 g via INTRAVENOUS
  Filled 2011-05-07 (×2): qty 50

## 2011-05-08 ENCOUNTER — Encounter (HOSPITAL_COMMUNITY): Payer: Self-pay

## 2011-05-08 ENCOUNTER — Ambulatory Visit (HOSPITAL_COMMUNITY): Payer: 59

## 2011-05-08 ENCOUNTER — Inpatient Hospital Stay (HOSPITAL_COMMUNITY)
Admission: RE | Admit: 2011-05-08 | Discharge: 2011-05-12 | DRG: 469 | Disposition: A | Discharge status: Home / Self Care | Payer: 59 | Source: Ambulatory Visit | Attending: Orthopedic Surgery | Admitting: Orthopedic Surgery

## 2011-05-08 ENCOUNTER — Encounter (HOSPITAL_COMMUNITY): Payer: Self-pay | Admitting: *Deleted

## 2011-05-08 ENCOUNTER — Encounter (HOSPITAL_COMMUNITY)
Admission: RE | Disposition: A | Discharge status: Home / Self Care | Payer: Self-pay | Source: Ambulatory Visit | Attending: Orthopedic Surgery

## 2011-05-08 DIAGNOSIS — K219 Gastro-esophageal reflux disease without esophagitis: Secondary | ICD-10-CM | POA: Diagnosis present

## 2011-05-08 DIAGNOSIS — B009 Herpesviral infection, unspecified: Secondary | ICD-10-CM | POA: Diagnosis present

## 2011-05-08 DIAGNOSIS — D62 Acute posthemorrhagic anemia: Secondary | ICD-10-CM | POA: Diagnosis not present

## 2011-05-08 DIAGNOSIS — J9601 Acute respiratory failure with hypoxia: Secondary | ICD-10-CM | POA: Diagnosis present

## 2011-05-08 DIAGNOSIS — M1710 Unilateral primary osteoarthritis, unspecified knee: Principal | ICD-10-CM | POA: Diagnosis present

## 2011-05-08 DIAGNOSIS — I10 Essential (primary) hypertension: Secondary | ICD-10-CM | POA: Diagnosis present

## 2011-05-08 DIAGNOSIS — F32A Depression, unspecified: Secondary | ICD-10-CM | POA: Diagnosis present

## 2011-05-08 DIAGNOSIS — Z7982 Long term (current) use of aspirin: Secondary | ICD-10-CM

## 2011-05-08 DIAGNOSIS — J95821 Acute postprocedural respiratory failure: Secondary | ICD-10-CM | POA: Diagnosis not present

## 2011-05-08 DIAGNOSIS — M1712 Unilateral primary osteoarthritis, left knee: Secondary | ICD-10-CM

## 2011-05-08 DIAGNOSIS — Y831 Surgical operation with implant of artificial internal device as the cause of abnormal reaction of the patient, or of later complication, without mention of misadventure at the time of the procedure: Secondary | ICD-10-CM | POA: Diagnosis not present

## 2011-05-08 DIAGNOSIS — F3289 Other specified depressive episodes: Secondary | ICD-10-CM | POA: Diagnosis present

## 2011-05-08 DIAGNOSIS — I5030 Unspecified diastolic (congestive) heart failure: Secondary | ICD-10-CM | POA: Diagnosis not present

## 2011-05-08 DIAGNOSIS — Z79899 Other long term (current) drug therapy: Secondary | ICD-10-CM

## 2011-05-08 DIAGNOSIS — J988 Other specified respiratory disorders: Secondary | ICD-10-CM | POA: Diagnosis not present

## 2011-05-08 DIAGNOSIS — F329 Major depressive disorder, single episode, unspecified: Secondary | ICD-10-CM | POA: Diagnosis present

## 2011-05-08 DIAGNOSIS — J189 Pneumonia, unspecified organism: Secondary | ICD-10-CM | POA: Diagnosis not present

## 2011-05-08 DIAGNOSIS — R600 Localized edema: Secondary | ICD-10-CM | POA: Diagnosis present

## 2011-05-08 DIAGNOSIS — E785 Hyperlipidemia, unspecified: Secondary | ICD-10-CM | POA: Diagnosis present

## 2011-05-08 HISTORY — PX: TOTAL KNEE ARTHROPLASTY: SHX125

## 2011-05-08 LAB — TYPE AND SCREEN
ABO/RH(D): A NEG
ABO/RH(D): A NEG
Antibody Screen: NEGATIVE
Antibody Screen: NEGATIVE

## 2011-05-08 SURGERY — ARTHROPLASTY, KNEE, TOTAL
Anesthesia: Regional | Site: Knee | Laterality: Left | Wound class: Clean

## 2011-05-08 MED ORDER — METHOCARBAMOL 500 MG PO TABS
500.0000 mg | ORAL_TABLET | Freq: Four times a day (QID) | ORAL | Status: DC | PRN
  Administered 2011-05-08: 500 mg via ORAL
  Filled 2011-05-08 (×2): qty 1

## 2011-05-08 MED ORDER — OXYCODONE HCL 10 MG PO TB12
10.0000 mg | ORAL_TABLET | Freq: Two times a day (BID) | ORAL | Status: DC
  Administered 2011-05-08 – 2011-05-10 (×5): 10 mg via ORAL
  Filled 2011-05-08 (×5): qty 1

## 2011-05-08 MED ORDER — BUPIVACAINE-EPINEPHRINE PF 0.5-1:200000 % IJ SOLN
INTRAMUSCULAR | Status: DC | PRN
  Administered 2011-05-08: 30 mL

## 2011-05-08 MED ORDER — BUPIVACAINE-EPINEPHRINE 0.25% -1:200000 IJ SOLN
INTRAMUSCULAR | Status: DC | PRN
  Administered 2011-05-08: 20 mL

## 2011-05-08 MED ORDER — LACTATED RINGERS IV SOLN
INTRAVENOUS | Status: DC | PRN
  Administered 2011-05-08 (×2): via INTRAVENOUS

## 2011-05-08 MED ORDER — PHENOL 1.4 % MT LIQD: 1.0000 | OROMUCOSAL | Status: DC | PRN

## 2011-05-08 MED ORDER — LIDOCAINE HCL (CARDIAC) 20 MG/ML IV SOLN
INTRAVENOUS | Status: DC | PRN
  Administered 2011-05-08: 60 mg via INTRAVENOUS

## 2011-05-08 MED ORDER — HYDROMORPHONE HCL PF 1 MG/ML IJ SOLN
0.2500 mg | INTRAMUSCULAR | Status: DC | PRN
  Administered 2011-05-08 (×3): 0.5 mg via INTRAVENOUS

## 2011-05-08 MED ORDER — BUPIVACAINE 0.25 % ON-Q PUMP SINGLE CATH 300ML
300.0000 mL | INJECTION | Status: DC
  Filled 2011-05-08 (×2): qty 300

## 2011-05-08 MED ORDER — SODIUM CHLORIDE 0.9 % IV SOLN: INTRAVENOUS | Status: DC

## 2011-05-08 MED ORDER — BUPROPION HCL 75 MG PO TABS
75.0000 mg | ORAL_TABLET | Freq: Every day | ORAL | Status: DC
  Administered 2011-05-09 – 2011-05-12 (×4): 75 mg via ORAL
  Filled 2011-05-08 (×4): qty 1

## 2011-05-08 MED ORDER — BISACODYL 5 MG PO TBEC: 5.0000 mg | DELAYED_RELEASE_TABLET | Freq: Every day | ORAL | Status: DC | PRN

## 2011-05-08 MED ORDER — ACETAMINOPHEN 325 MG PO TABS: 650.0000 mg | ORAL_TABLET | Freq: Four times a day (QID) | ORAL | Status: DC | PRN

## 2011-05-08 MED ORDER — MENTHOL 3 MG MT LOZG: 1.0000 | LOZENGE | OROMUCOSAL | Status: DC | PRN

## 2011-05-08 MED ORDER — FLEET ENEMA 7-19 GM/118ML RE ENEM: 1.0000 | ENEMA | Freq: Once | RECTAL | Status: AC | PRN

## 2011-05-08 MED ORDER — FENTANYL CITRATE 0.05 MG/ML IJ SOLN
INTRAMUSCULAR | Status: DC | PRN
  Administered 2011-05-08: 100 ug via INTRAVENOUS
  Administered 2011-05-08: 50 ug via INTRAVENOUS

## 2011-05-08 MED ORDER — CELECOXIB 200 MG PO CAPS
200.0000 mg | ORAL_CAPSULE | Freq: Two times a day (BID) | ORAL | Status: DC
  Administered 2011-05-08 – 2011-05-12 (×8): 200 mg via ORAL
  Filled 2011-05-08 (×9): qty 1

## 2011-05-08 MED ORDER — HYDROMORPHONE HCL PF 1 MG/ML IJ SOLN
INTRAMUSCULAR | Status: AC
  Administered 2011-05-08: 0.5 mg via INTRAVENOUS
  Filled 2011-05-08: qty 1

## 2011-05-08 MED ORDER — CEFAZOLIN SODIUM-DEXTROSE 2-3 GM-% IV SOLR
2.0000 g | Freq: Four times a day (QID) | INTRAVENOUS | Status: AC
  Administered 2011-05-08 – 2011-05-09 (×3): 2 g via INTRAVENOUS
  Filled 2011-05-08 (×3): qty 50

## 2011-05-08 MED ORDER — SODIUM CHLORIDE 0.9 % IV SOLN
INTRAVENOUS | Status: DC
  Administered 2011-05-08: 1000 mL via INTRAVENOUS

## 2011-05-08 MED ORDER — ONDANSETRON HCL 4 MG PO TABS: 4.0000 mg | ORAL_TABLET | Freq: Four times a day (QID) | ORAL | Status: DC | PRN

## 2011-05-08 MED ORDER — DOCUSATE SODIUM 100 MG PO CAPS
100.0000 mg | ORAL_CAPSULE | Freq: Two times a day (BID) | ORAL | Status: DC
  Administered 2011-05-08 – 2011-05-12 (×8): 100 mg via ORAL
  Filled 2011-05-08 (×9): qty 1

## 2011-05-08 MED ORDER — SODIUM CHLORIDE 0.9 % IR SOLN
Status: DC | PRN
  Administered 2011-05-08: 1000 mL
  Administered 2011-05-08: 3000 mL

## 2011-05-08 MED ORDER — VENLAFAXINE HCL ER 150 MG PO CP24
300.0000 mg | ORAL_CAPSULE | Freq: Every day | ORAL | Status: DC
  Administered 2011-05-08 – 2011-05-11 (×4): 300 mg via ORAL
  Filled 2011-05-08 (×5): qty 2

## 2011-05-08 MED ORDER — ACETAMINOPHEN 650 MG RE SUPP: 650.0000 mg | Freq: Four times a day (QID) | RECTAL | Status: DC | PRN

## 2011-05-08 MED ORDER — PROPOFOL 10 MG/ML IV EMUL
INTRAVENOUS | Status: DC | PRN
  Administered 2011-05-08: 200 mg via INTRAVENOUS

## 2011-05-08 MED ORDER — FUROSEMIDE 40 MG PO TABS
40.0000 mg | ORAL_TABLET | Freq: Every day | ORAL | Status: DC
  Administered 2011-05-08 – 2011-05-09 (×2): 40 mg via ORAL
  Filled 2011-05-08 (×2): qty 1

## 2011-05-08 MED ORDER — SIMVASTATIN 40 MG PO TABS
40.0000 mg | ORAL_TABLET | Freq: Every day | ORAL | Status: DC
  Administered 2011-05-08 – 2011-05-11 (×4): 40 mg via ORAL
  Filled 2011-05-08 (×5): qty 1

## 2011-05-08 MED ORDER — OXYCODONE HCL 5 MG PO TABS
5.0000 mg | ORAL_TABLET | ORAL | Status: DC | PRN
  Administered 2011-05-08 – 2011-05-09 (×6): 10 mg via ORAL
  Administered 2011-05-11 – 2011-05-12 (×3): 5 mg via ORAL
  Filled 2011-05-08 (×2): qty 2
  Filled 2011-05-08: qty 1
  Filled 2011-05-08: qty 2
  Filled 2011-05-08: qty 1
  Filled 2011-05-08: qty 2
  Filled 2011-05-08: qty 1
  Filled 2011-05-08 (×2): qty 2

## 2011-05-08 MED ORDER — LISINOPRIL 40 MG PO TABS
80.0000 mg | ORAL_TABLET | Freq: Every day | ORAL | Status: DC
  Administered 2011-05-08 – 2011-05-12 (×5): 80 mg via ORAL
  Filled 2011-05-08 (×5): qty 2

## 2011-05-08 MED ORDER — ONDANSETRON HCL 4 MG/2ML IJ SOLN
4.0000 mg | Freq: Four times a day (QID) | INTRAMUSCULAR | Status: DC | PRN
  Filled 2011-05-08: qty 2

## 2011-05-08 MED ORDER — DIPHENHYDRAMINE HCL 12.5 MG/5ML PO ELIX
12.5000 mg | ORAL_SOLUTION | ORAL | Status: DC | PRN
  Filled 2011-05-08: qty 10

## 2011-05-08 MED ORDER — ALUM & MAG HYDROXIDE-SIMETH 200-200-20 MG/5ML PO SUSP
30.0000 mL | ORAL | Status: DC | PRN
  Administered 2011-05-09: 30 mL via ORAL

## 2011-05-08 MED ORDER — MIDAZOLAM HCL 5 MG/5ML IJ SOLN
INTRAMUSCULAR | Status: DC | PRN
  Administered 2011-05-08: 2 mg via INTRAVENOUS

## 2011-05-08 MED ORDER — PROMETHAZINE HCL 25 MG/ML IJ SOLN: 6.2500 mg | INTRAMUSCULAR | Status: DC | PRN

## 2011-05-08 MED ORDER — AMLODIPINE BESYLATE 10 MG PO TABS
10.0000 mg | ORAL_TABLET | Freq: Every day | ORAL | Status: DC
  Administered 2011-05-09 – 2011-05-12 (×4): 10 mg via ORAL
  Filled 2011-05-08 (×4): qty 1

## 2011-05-08 MED ORDER — ENOXAPARIN SODIUM 30 MG/0.3ML ~~LOC~~ SOLN
30.0000 mg | Freq: Two times a day (BID) | SUBCUTANEOUS | Status: DC
  Administered 2011-05-09 – 2011-05-12 (×7): 30 mg via SUBCUTANEOUS
  Filled 2011-05-08 (×10): qty 0.3

## 2011-05-08 MED ORDER — METOCLOPRAMIDE HCL 10 MG PO TABS
5.0000 mg | ORAL_TABLET | Freq: Three times a day (TID) | ORAL | Status: DC | PRN
  Filled 2011-05-08: qty 1

## 2011-05-08 MED ORDER — METHOCARBAMOL 100 MG/ML IJ SOLN
500.0000 mg | INTRAVENOUS | Status: AC
  Filled 2011-05-08: qty 5

## 2011-05-08 MED ORDER — METOCLOPRAMIDE HCL 5 MG/ML IJ SOLN
5.0000 mg | Freq: Three times a day (TID) | INTRAMUSCULAR | Status: DC | PRN
  Filled 2011-05-08: qty 2

## 2011-05-08 MED ORDER — ONDANSETRON HCL 4 MG/2ML IJ SOLN
INTRAMUSCULAR | Status: DC | PRN
  Administered 2011-05-08: 4 mg via INTRAVENOUS

## 2011-05-08 MED ORDER — ATENOLOL 100 MG PO TABS
100.0000 mg | ORAL_TABLET | Freq: Every day | ORAL | Status: DC
  Administered 2011-05-09 – 2011-05-12 (×4): 100 mg via ORAL
  Filled 2011-05-08 (×4): qty 1

## 2011-05-08 MED ORDER — BUPIVACAINE 0.25 % ON-Q PUMP SINGLE CATH 300ML
INJECTION | Status: DC | PRN
  Administered 2011-05-08: 300 mL

## 2011-05-08 MED ORDER — HYDROMORPHONE HCL PF 1 MG/ML IJ SOLN
0.5000 mg | INTRAMUSCULAR | Status: DC | PRN
  Administered 2011-05-09: 1 mg via INTRAVENOUS
  Administered 2011-05-09 – 2011-05-10 (×5): 0.5 mg via INTRAVENOUS
  Administered 2011-05-11 (×2): 1 mg via INTRAVENOUS
  Filled 2011-05-08 (×8): qty 1

## 2011-05-08 MED ORDER — ZOLPIDEM TARTRATE 5 MG PO TABS: 5.0000 mg | ORAL_TABLET | Freq: Every evening | ORAL | Status: DC | PRN

## 2011-05-08 MED ORDER — ACETAMINOPHEN 10 MG/ML IV SOLN
INTRAVENOUS | Status: AC
  Filled 2011-05-08: qty 100

## 2011-05-08 MED ORDER — BUPIVACAINE ON-Q PAIN PUMP (FOR ORDER SET NO CHG)
INJECTION | Status: AC
  Filled 2011-05-08: qty 1

## 2011-05-08 MED ORDER — METHOCARBAMOL 100 MG/ML IJ SOLN
500.0000 mg | Freq: Four times a day (QID) | INTRAVENOUS | Status: DC | PRN
  Administered 2011-05-08: 500 mg via INTRAVENOUS
  Filled 2011-05-08: qty 5

## 2011-05-08 MED ORDER — SENNOSIDES-DOCUSATE SODIUM 8.6-50 MG PO TABS: 1.0000 | ORAL_TABLET | Freq: Every evening | ORAL | Status: DC | PRN

## 2011-05-08 SURGICAL SUPPLY — 57 items
BANDAGE ESMARK 6X9 LF (GAUZE/BANDAGES/DRESSINGS) ×1 IMPLANT
BLADE SAGITTAL 13X1.27X60 (BLADE) ×2 IMPLANT
BLADE SAW SGTL 83.5X18.5 (BLADE) ×2 IMPLANT
BNDG CMPR 9X6 STRL LF SNTH (GAUZE/BANDAGES/DRESSINGS) ×1
BNDG ESMARK 6X9 LF (GAUZE/BANDAGES/DRESSINGS) ×2
BOWL SMART MIX CTS (DISPOSABLE) ×2 IMPLANT
CATH KIT ON Q 5IN SLV (PAIN MANAGEMENT) ×2 IMPLANT
CEMENT BONE SIMPLEX SPEEDSET (Cement) ×4 IMPLANT
CLOTH BEACON ORANGE TIMEOUT ST (SAFETY) ×2 IMPLANT
COVER BACK TABLE 24X17X13 BIG (DRAPES) ×1 IMPLANT
COVER SURGICAL LIGHT HANDLE (MISCELLANEOUS) ×2 IMPLANT
CUFF TOURNIQUET SINGLE 34IN LL (TOURNIQUET CUFF) ×2 IMPLANT
DRAPE EXTREMITY T 121X128X90 (DRAPE) ×2 IMPLANT
DRAPE INCISE IOBAN 66X45 STRL (DRAPES) ×4 IMPLANT
DRAPE PROXIMA HALF (DRAPES) ×2 IMPLANT
DRAPE U-SHAPE 47X51 STRL (DRAPES) ×2 IMPLANT
DRSG ADAPTIC 3X8 NADH LF (GAUZE/BANDAGES/DRESSINGS) ×2 IMPLANT
DRSG EMULSION OIL 3X3 NADH (GAUZE/BANDAGES/DRESSINGS) ×1 IMPLANT
DRSG PAD ABDOMINAL 8X10 ST (GAUZE/BANDAGES/DRESSINGS) ×2 IMPLANT
DURAPREP 26ML APPLICATOR (WOUND CARE) ×4 IMPLANT
ELECT REM PT RETURN 9FT ADLT (ELECTROSURGICAL) ×2
ELECTRODE REM PT RTRN 9FT ADLT (ELECTROSURGICAL) ×1 IMPLANT
EVACUATOR 1/8 PVC DRAIN (DRAIN) ×2 IMPLANT
GLOVE BIOGEL M 7.0 STRL (GLOVE) ×1 IMPLANT
GLOVE BIOGEL PI IND STRL 7.5 (GLOVE) IMPLANT
GLOVE BIOGEL PI IND STRL 8.5 (GLOVE) ×2 IMPLANT
GLOVE BIOGEL PI INDICATOR 7.5 (GLOVE) ×1
GLOVE BIOGEL PI INDICATOR 8.5 (GLOVE) ×1
GLOVE SURG ORTHO 8.0 STRL STRW (GLOVE) ×3 IMPLANT
GOWN PREVENTION PLUS XLARGE (GOWN DISPOSABLE) ×4 IMPLANT
GOWN STRL NON-REIN LRG LVL3 (GOWN DISPOSABLE) ×4 IMPLANT
HANDPIECE INTERPULSE COAX TIP (DISPOSABLE) ×2
HOOD PEEL AWAY FACE SHEILD DIS (HOOD) ×8 IMPLANT
KIT BASIN OR (CUSTOM PROCEDURE TRAY) ×2 IMPLANT
KIT ROOM TURNOVER OR (KITS) ×2 IMPLANT
MANIFOLD NEPTUNE II (INSTRUMENTS) ×2 IMPLANT
NEEDLE 22X1 1/2 (OR ONLY) (NEEDLE) ×1 IMPLANT
NS IRRIG 1000ML POUR BTL (IV SOLUTION) ×2 IMPLANT
PACK TOTAL JOINT (CUSTOM PROCEDURE TRAY) ×2 IMPLANT
PAD ARMBOARD 7.5X6 YLW CONV (MISCELLANEOUS) ×4 IMPLANT
PADDING CAST COTTON 6X4 STRL (CAST SUPPLIES) ×2 IMPLANT
POSITIONER HEAD PRONE TRACH (MISCELLANEOUS) ×2 IMPLANT
SET HNDPC FAN SPRY TIP SCT (DISPOSABLE) ×1 IMPLANT
SPONGE GAUZE 4X4 12PLY (GAUZE/BANDAGES/DRESSINGS) ×2 IMPLANT
STAPLER VISISTAT 35W (STAPLE) ×2 IMPLANT
SUCTION FRAZIER TIP 10 FR DISP (SUCTIONS) ×2 IMPLANT
SUT BONE WAX W31G (SUTURE) ×2 IMPLANT
SUT VIC AB 0 CTB1 27 (SUTURE) ×4 IMPLANT
SUT VIC AB 1 CT1 27 (SUTURE) ×8
SUT VIC AB 1 CT1 27XBRD ANBCTR (SUTURE) ×4 IMPLANT
SUT VIC AB 2-0 CT1 27 (SUTURE) ×4
SUT VIC AB 2-0 CT1 TAPERPNT 27 (SUTURE) ×2 IMPLANT
SYR CONTROL 10ML LL (SYRINGE) ×1 IMPLANT
TOWEL OR 17X24 6PK STRL BLUE (TOWEL DISPOSABLE) ×2 IMPLANT
TOWEL OR 17X26 10 PK STRL BLUE (TOWEL DISPOSABLE) ×2 IMPLANT
TRAY FOLEY CATH 14FR (SET/KITS/TRAYS/PACK) ×2 IMPLANT
WATER STERILE IRR 1000ML POUR (IV SOLUTION) ×4 IMPLANT

## 2011-05-08 NOTE — H&P (Signed)
Sierra Banks MRN:  [id number] DOB/SEX:  05-03-1950/female  CHIEF COMPLAINT:  Painful left Knee  HISTORY: Patient is a 61 y.o. female presented with a history of pain in the left knee. Onset of symptoms was gradual starting several years ago with gradually worsening course since that time. The patient noted no past surgery on the left knee. Prior procedures on the knee include arthroscopy. Patient has been treated conservatively with over-the-counter NSAIDs and activity modification. Patient currently rates pain in the knee at 8 out of 10 with activity. There is pain at night.  PAST MEDICAL HISTORY: Patient Active Problem List  Diagnoses Date Noted  . Skin tag 10/27/2010  . Postmenopausal bleeding 04/07/2010  . ABSCESS, BREAST, RIGHT 10/04/2009  . 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 OVERWEIGHT 01/12/2009  . HYPERLIPIDEMIA 12/23/2007  . DEPRESSION 08/28/2006  . HYPERTENSION 08/28/2006   Past Medical History  Diagnosis Date  . 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 HYPERLIPIDEMIA 12/23/2007  . Overweight 01/12/2009  . HYPERTENSION 08/28/2006  . DEPRESSION 08/28/2006  . ABSCESS, BREAST, RIGHT 10/04/2009  . No pertinent past medical history     BRONCHITIS 3 WEEKS  COUGH   . GERD (gastroesophageal reflux disease)     OCC TUMS   . Dysrhythmia     HEART PALPITATIONS   . Hepatitis     JAUNDICE AGE 19   Past Surgical History  Procedure Date  . Abdominal hysterectomy   . Tonsillectomy     1969  . Tubal ligation     1980  . Carpal tunnel release     LEFT  2002  . Dilation and curettage of uterus     2011     MEDICATIONS:   Prescriptions prior to admission  Medication Sig Dispense Refill  . amLODipine (NORVASC) 10 MG tablet Take 10 mg by mouth daily.      Marland Kitchen aspirin EC 325 MG tablet Take 325 mg by mouth daily.      Marland Kitchen atenolol (TENORMIN) 100 MG tablet Take 100 mg by mouth daily.      Marland Kitchen buPROPion (WELLBUTRIN) 75 MG tablet Take 75 mg by mouth 1 day or 1 dose.      . Coenzyme Q10 (CO Q 10 PO) Take 1 capsule by mouth  daily.      . fish oil-omega-3 fatty acids 1000 MG capsule Take 1 g by mouth daily.      . furosemide (LASIX) 40 MG tablet Take 40 mg by mouth daily.      Marland Kitchen lisinopril (PRINIVIL,ZESTRIL) 40 MG tablet Take 80 mg by mouth daily.      . Multiple Vitamin (MULITIVITAMIN WITH MINERALS) TABS Take 1 tablet by mouth daily.      . simvastatin (ZOCOR) 40 MG tablet Take 40 mg by mouth at bedtime.      Marland Kitchen venlafaxine (EFFEXOR-XR) 150 MG 24 hr capsule Take 300 mg by mouth at bedtime.        ALLERGIES:  No Known Allergies  REVIEW OF SYSTEMS:  Pertinent items are noted in HPI.   FAMILY HISTORY:  History reviewed. No pertinent family history.  SOCIAL HISTORY:   History  Substance Use Topics  . Smoking status: Never Smoker   . Smokeless tobacco: Not on file  . Alcohol Use: No     EXAMINATION:  Vital signs in last 24 hours: Temp:  [98.1 F (36.7 C)] 98.1 F (36.7 C) (03/25 0654) Pulse Rate:  [67] 67  (03/25 0654) Resp:  [16] 16  (03/25 0654) BP: (151)/(84) 151/84  mmHg (03/25 0654) SpO2:  [95 %] 95 % (03/25 0654)  General appearance: alert, cooperative and no distress Lungs: clear to auscultation bilaterally Heart: regular rate and rhythm, S1, S2 normal, no murmur, click, rub or gallop Abdomen: soft, non-tender; bowel sounds normal; no masses,  no organomegaly Extremities: extremities normal, atraumatic, no cyanosis or edema and Homans sign is negative, no sign of DVT Pulses: 2+ and symmetric Skin: Skin color, texture, turgor normal. No rashes or lesions Neurologic: Alert and oriented X 3, normal strength and tone. Normal symmetric reflexes. Normal coordination and gait  Musculoskeletal:  ROM 0-110, Ligaments intact,  Imaging Review Plain radiographs demonstrate severe degenerative joint disease of the left knee. The overall alignment is mild varus. The bone quality appears to be good for age and reported activity level.  Assessment/Plan: End stage arthritis, left knee   The patient  history, physical examination and imaging studies are consistent with advanced degenerative joint disease of the left knee. The patient has failed conservative treatment.  The clearance notes were reviewed.  After discussion with the patient it was felt that Total Knee Replacement was indicated. The procedure,  risks, and benefits of total knee arthroplasty were presented and reviewed. The risks including but not limited to aseptic loosening, infection, blood clots, vascular injury, stiffness, patella tracking problems complications among others were discussed. The patient acknowledged the explanation, agreed to proceed with the plan.  Gracen Southwell 05/08/2011, 7:13 AM

## 2011-05-08 NOTE — Op Note (Signed)
TOTAL KNEE REPLACEMENT OPERATIVE NOTE:  05/08/2011  11:46 AM  PATIENT:  Sierra Banks  61 y.o. female  PRE-OPERATIVE DIAGNOSIS:  osteoarthritis left knee  POST-OPERATIVE DIAGNOSIS:  osteoarthritis left knee  PROCEDURE:  Procedure(s): TOTAL KNEE ARTHROPLASTY  SURGEON:  Surgeon(s): Raymon Mutton, MD  PHYSICIAN ASSISTANT: [name], [facility]  ANESTHESIA:   general  DRAINS: Hemovac and On-Q Marcaine Pain Pump  SPECIMEN: None  COUNTS:  Correct  TOURNIQUET:   Total Tourniquet Time Documented: Thigh (Left) - 49 minutes  DICTATION:  Indication for procedure:    The patient is a 61 y.o. female who has failed conservative treatment for osteoarthritis left knee.  Informed consent was obtained prior to anesthesia. The risks versus benefits of the operation were explain and in a way the patient can, and did, understand.   Description of procedure:     The patient was taken to the operating room and placed under anesthesia.  The patient was positioned in the usual fashion taking care that all body parts were adequately padded and/or protected.  I foley catheter was placed.  A tourniquet was applied and the leg prepped and draped in the usual sterile fashion.  The extremity was exsanguinated with the esmarch and tourniquet inflated to 350 mmHg.  Pre-operative range of motion was normal.  The knee was in 3 degree of mild varus.  A midline incision approximately 6-7 inches long was made with a #10 blade.  A new blade was used to make a parapatellar arthrotomy going 2-3 cm into the quadriceps tendon, over the patella, and alongside the medial aspect of the patellar tendon.  A synovectomy was then performed with the #10 blade and forceps. I then elevated the deep MCL off the medial tibial metaphysis subperiosteally around to the semimembranosus attachment.    I everted the patella and used calipers to measure patellar thickness.  I used the reamer to ream down to appropriate thickness to  recreate the native thickness.  I then removed excess bone with the rongeur and sagittal saw.  I used the appropriately sized template and drilled the three lug holes.  I then put the trial in place and measured the thickness with the calipers to ensure recreation of the native thickness.  The trial was then removed and the patella subluxed and the knee brought into flexion.  A homan retractor was place to retract and protect the patella and lateral structures.  A Z-retractor was place medially to protect the medial structures.  The extra-medullary alignment system was used to make cut the tibial articular surface perpendicular to the anamotic axis of the tibia and in 3 degrees of posterior slope.  The cut surface and alignment jig was removed.  I then used the intramedullary alignment guide to make a 6 valgus cut on the distal femur.  I then marked out the epicondylar axis on the distal femur.  The posterior condylar axis measured 3 degrees.  I then used the anterior referencing sizer and measured the femur to be a size E.  The 4-In-1 cutting block was screwed into place in external rotation matching the posterior condylar angle, making our cuts perpendicular to the epicondylar axis.  Anterior, posterior and chamfer cuts were made with the sagittal saw.  The cutting block and cut pieces were removed.  A lamina spreader was placed in 90 degrees of flexion.  The ACL, PCL, menisci, and posterior condylar osteophytes were removed.  A 10 mm spacer blocked was found to offer good flexion  and extension gap balance after minimal in degree releasing.   The scoop retractor was then placed and the femoral finishing block was pinned in place.  The small sagittal saw was used as well as the lug drill to finish the femur.  The block and cut surfaces were removed and the medullary canal hole filled with autograft bone from the cut pieces.  The tibia was delivered forward in deep flexion and external rotation.  A size 4  tray was selected and pinned into place centered on the medial 1/3 of the tibial tubercle.  The reamer and keel was used to prepare the tibia through the tray.    I then trialed with the size E femur, size 4 tibia, a 10 mm insert and the 32 patella.  I had excellent flexion/extension gap balance, excellent patella tracking.  Flexion was full and beyond 120 degrees; extension was zero.  These components were chosen and the staff opened them to me on the back table while the knee was lavaged copiously and the cement mixed.  I cemented in the components and removed all excess cement.  The polyethylene tibial component was snapped into place and the knee placed in extension while cement was hardening.  The capsule was infilltrated with 20cc of .25% Marcaine with epinephrine.  A hemovac was place in the joint exiting superolaterally.  A pain pump was place superomedially superficial to the arthrotomy.  Once the cement was hard, the tourniquet was let down.  Hemostasis was obtained.  The arthrotomy was closed with figure-8 #1 vicryl sutures.  The deep soft tissues were closed with #0 vicryls and the subcuticular layer closed with a running #2-0 vicryl.  The skin was reapproximated and closed with skin staples.  The wound was dressed with xeroform, 4 x4's, 2 ABD sponges, a single layer of webril and a TED stocking.   The patient was then awakened, extubated, and taken to the recovery room in stable condition.  BLOOD LOSS:  300cc DRAINS: 1 hemovac, 1 pain catheter COMPLICATIONS:  None.  PLAN OF CARE: Admit to inpatient   PATIENT DISPOSITION:  PACU - hemodynamically stable.   Delay start of Pharmacological VTE agent (>24hrs) due to surgical blood loss or risk of bleeding:  not applicable  Please fax a copy of this op note to my office at [phone] (please only include page 1 and 2 of the Case Information op note)

## 2011-05-08 NOTE — Anesthesia Preprocedure Evaluation (Signed)
Anesthesia Evaluation  Patient identified by MRN, date of birth, ID band Patient awake    Reviewed: Allergy & Precautions, H&P , NPO status , Patient's Chart, lab work & pertinent test results, reviewed documented beta blocker date and time   History of Anesthesia Complications Negative for: history of anesthetic complications  Airway Mallampati: I      Dental  (+) Teeth Intact   Pulmonary  breath sounds clear to auscultation        Cardiovascular hypertension, + dysrhythmias Rhythm:Regular Rate:Normal     Neuro/Psych PSYCHIATRIC DISORDERS Depression    GI/Hepatic GERD-  ,  Endo/Other    Renal/GU      Musculoskeletal   Abdominal   Peds  Hematology   Anesthesia Other Findings   Reproductive/Obstetrics                           Anesthesia Physical Anesthesia Plan  ASA: II  Anesthesia Plan: General   Post-op Pain Management:    Induction: Intravenous  Airway Management Planned: LMA  Additional Equipment:   Intra-op Plan:   Post-operative Plan: Extubation in OR  Informed Consent: I have reviewed the patients History and Physical, chart, labs and discussed the procedure including the risks, benefits and alternatives for the proposed anesthesia with the patient or authorized representative who has indicated his/her understanding and acceptance.   Dental advisory given  Plan Discussed with: Anesthesiologist  Anesthesia Plan Comments:         Anesthesia Quick Evaluation

## 2011-05-08 NOTE — Anesthesia Postprocedure Evaluation (Signed)
  Anesthesia Post-op Note  Patient: Sierra Banks  Procedure(s) Performed: Procedure(s) (LRB): TOTAL KNEE ARTHROPLASTY (Left)  Patient Location: PACU  Anesthesia Type: GA combined with regional for post-op pain  Level of Consciousness: awake  Airway and Oxygen Therapy: Patient Spontanous Breathing and Patient connected to nasal cannula oxygen  Post-op Pain: moderate  Post-op Assessment: Post-op Vital signs reviewed, Patient's Cardiovascular Status Stable, Respiratory Function Stable, Patent Airway and No signs of Nausea or vomiting  Post-op Vital Signs: Reviewed and stable  Complications: No apparent anesthesia complications

## 2011-05-08 NOTE — Transfer of Care (Signed)
Immediate Anesthesia Transfer of Care Note  Patient: Sierra Banks  Procedure(s) Performed: Procedure(s) (LRB): TOTAL KNEE ARTHROPLASTY (Left)  Patient Location: PACU  Anesthesia Type: General  Level of Consciousness: awake, alert  and oriented  Airway & Oxygen Therapy: Patient Spontanous Breathing and Patient connected to nasal cannula oxygen  Post-op Assessment: Report given to PACU RN and Post -op Vital signs reviewed and stable  Post vital signs: Reviewed and stable  Complications: No apparent anesthesia complications

## 2011-05-08 NOTE — Progress Notes (Signed)
Orthopedic Tech Progress Note Patient Details:  Sierra Banks October 30, 1950 [id number]  CPM Left Knee CPM Left Knee: On Left Knee Flexion (Degrees): 90  Left Knee Extension (Degrees): 0  Additional Comments: trapeze bar   Cammer, Mickie Bail 05/08/2011, 11:44 AM

## 2011-05-08 NOTE — Anesthesia Procedure Notes (Addendum)
Anesthesia Regional Block:  Femoral nerve block  Pre-Anesthetic Checklist: ,, timeout performed, Correct Patient, Correct Site, Correct Laterality, Correct Procedure, Correct Position, site marked, Risks and benefits discussed, pre-op evaluation,  At surgeon's request and post-op pain management  Laterality: Left  Prep: Maximum Sterile Barrier Precautions used and chloraprep       Needles:  Injection technique: Single-shot  Needle Type: Echogenic Stimulator Needle      Needle Gauge: 22 and 22 G    Additional Needles:  Procedures: nerve stimulator Femoral nerve block  Nerve Stimulator or Paresthesia:  Response: Patellar respose, 0.4 mA,   Additional Responses:   Narrative:  Start time: 05/08/2011 8:10 AM End time: 05/08/2011 8:20 AM Injection made incrementally with aspirations every 5 mL. Anesthesiologist: Fitzgerald,MD  Additional Notes: 2% Lidocaine skin wheel.   Femoral nerve block Procedure Name: LMA Insertion Date/Time: 05/08/2011 8:56 AM Performed by: Fuller [place] Pre-anesthesia Checklist: Patient identified, Timeout performed, Emergency Drugs available, Suction available and Patient being monitored Patient Re-evaluated:Patient Re-evaluated prior to inductionOxygen Delivery Method: Circle system utilized Preoxygenation: Pre-oxygenation with 100% oxygen Intubation Type: IV induction LMA: LMA inserted LMA Size: 4.0 Number of attempts: 1 Tube secured with: Tape Dental Injury: Teeth and Oropharynx as per pre-operative assessment

## 2011-05-09 ENCOUNTER — Encounter (HOSPITAL_COMMUNITY): Payer: Self-pay | Admitting: Orthopedic Surgery

## 2011-05-09 ENCOUNTER — Inpatient Hospital Stay (HOSPITAL_COMMUNITY): Payer: 59

## 2011-05-09 LAB — BASIC METABOLIC PANEL
BUN: 21 mg/dL (ref 6–23)
CO2: 26 mEq/L (ref 19–32)
Calcium: 8.8 mg/dL (ref 8.4–10.5)
Chloride: 101 mEq/L (ref 96–112)
Creatinine, Ser: 1.46 mg/dL — ABNORMAL HIGH (ref 0.50–1.10)
GFR calc Af Amer: 44 mL/min — ABNORMAL LOW (ref >90)
GFR calc non Af Amer: 38 mL/min — ABNORMAL LOW (ref >90)
Glucose, Bld: 104 mg/dL — ABNORMAL HIGH (ref 70–99)
Potassium: 4.1 mEq/L (ref 3.5–5.1)
Sodium: 137 mEq/L (ref 135–145)

## 2011-05-09 LAB — CBC
HCT: 30.5 % — ABNORMAL LOW (ref 36.0–46.0)
Hemoglobin: 9.7 g/dL — ABNORMAL LOW (ref 12.0–15.0)
MCH: 28 pg (ref 26.0–34.0)
MCHC: 31.8 g/dL (ref 30.0–36.0)
MCV: 88.2 fL (ref 78.0–100.0)
Platelets: 239 10*3/uL (ref 150–400)
RBC: 3.46 MIL/uL — ABNORMAL LOW (ref 3.87–5.11)
RDW: 14.6 % (ref 11.5–15.5)
WBC: 11.8 10*3/uL — ABNORMAL HIGH (ref 4.0–10.5)

## 2011-05-09 MED ORDER — SODIUM CHLORIDE 0.9 % IV SOLN
INTRAVENOUS | Status: DC
  Administered 2011-05-09 – 2011-05-10 (×3): via INTRAVENOUS

## 2011-05-09 MED ORDER — FUROSEMIDE 40 MG PO TABS
40.0000 mg | ORAL_TABLET | Freq: Once | ORAL | Status: AC
  Administered 2011-05-09: 40 mg via ORAL
  Filled 2011-05-09: qty 1

## 2011-05-09 MED ORDER — FUROSEMIDE 80 MG PO TABS
80.0000 mg | ORAL_TABLET | Freq: Every day | ORAL | Status: DC
  Administered 2011-05-10: 80 mg via ORAL
  Filled 2011-05-09: qty 1

## 2011-05-09 NOTE — Progress Notes (Signed)
PT Cancellation Note  Treatment cancelled this afternoon due to medical issues with patient which prohibited therapy.  Gabriel Paulding 05/09/2011, 1:13 PM

## 2011-05-09 NOTE — Evaluation (Signed)
Physical Therapy Evaluation Patient Details Name: Sierra Banks MRN: [id number] DOB: 02/04/1951 Today's Date: 05/09/2011  Problem List:  Patient Active Problem List  Diagnoses  . HSV  . HYPERLIPIDEMIA  . OVERWEIGHT  . DEPRESSION  . HYPERTENSION  . ABSCESS, BREAST, RIGHT  . Postmenopausal bleeding  . Skin tag    Past Medical History:  Past Medical History  Diagnosis Date  . 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 HYPERLIPIDEMIA 12/23/2007  . Overweight 01/12/2009  . HYPERTENSION 08/28/2006  . DEPRESSION 08/28/2006  . ABSCESS, BREAST, RIGHT 10/04/2009  . No pertinent past medical history     BRONCHITIS 3 WEEKS  COUGH   . GERD (gastroesophageal reflux disease)     OCC TUMS   . Dysrhythmia     HEART PALPITATIONS   . Hepatitis     JAUNDICE AGE 61   Past Surgical History:  Past Surgical History  Procedure Date  . Abdominal hysterectomy   . Tonsillectomy     1969  . Tubal ligation     1980  . Carpal tunnel release     LEFT  2002  . Dilation and curettage of uterus     2011    PT Assessment/Plan/Recommendation PT Assessment Clinical Impression Statement: Pt presents wtih decreased strength, gait, ROM, mobility and activity tolerance and will benefit from acute PT services to increase functional independence for safe d/c home. PT Recommendation/Assessment: Patient will need skilled PT in the acute care venue PT Problem List: Decreased strength;Decreased range of motion;Decreased activity tolerance;Decreased balance;Decreased knowledge of use of DME;Decreased mobility;Decreased knowledge of precautions PT Therapy Diagnosis : Difficulty walking;Generalized weakness PT Plan PT Frequency: 7X/week PT Treatment/Interventions: Functional mobility training;Stair training;Gait training;DME instruction;Therapeutic activities;Therapeutic exercise;Balance training;Patient/family education PT Recommendation Follow Up Recommendations: Home health PT Equipment Recommended: Rolling walker with 5"  wheels PT Goals  Acute Rehab PT Goals PT Goal Formulation: With patient Time For Goal Achievement: 7 days Pt will go Supine/Side to Sit: with modified independence Pt will Transfer Bed to Chair/Chair to Bed: with modified independence Pt will Ambulate: 51 - 150 feet;with modified independence;with least restrictive assistive device Pt will Go Up / Down Stairs: 1-2 stairs;with supervision;with least restrictive assistive device Pt will Perform Home Exercise Program: Independently  PT Evaluation Precautions/Restrictions  Precautions Precautions: Knee;Fall Required Braces or Orthoses: No Restrictions LLE Weight Bearing: Weight bearing as tolerated Prior Functioning  Home Living Lives With: Spouse Receives Help From: Family Type of Home: House Home Layout: One level Home Access: Stairs to enter Entrance Stairs-Rails: None Entrance Stairs-Number of Steps: 1 Prior Function Level of Independence: Independent with homemaking with ambulation;Independent with gait;Independent with transfers Cognition Cognition Arousal/Alertness: Awake/alert Overall Cognitive Status: Appears within functional limits for tasks assessed Sensation/Coordination Sensation Light Touch: Appears Intact Proprioception: Appears Intact Coordination Gross Motor Movements are Fluid and Coordinated: Yes Extremity Assessment RLE Assessment RLE Assessment: Within Functional Limits LLE Assessment LLE Assessment:  (grossly 3-/5 limited by knee pain and swelling) Mobility (including Balance) Bed Mobility Supine to Sit: 4: Min assist Supine to Sit Details (indicate cue type and reason): assist for L LE, cues for technique Sitting - Scoot to Edge of Bed: 4: Min assist Sitting - Scoot to [place] of Bed Details (indicate cue type and reason): assist for L LE to prevent dropping off side of bed too quickly, cues for technique Transfers Sit to Stand: 4: Min assist Sit to Stand Details (indicate cue type and reason): cues  for hand and LE  placement, manual assist for lift. pt with increased  dizziness with standing, eased with 1 minute static stance with deep breathing Stand to Sit: 4: Min assist Stand to Sit Details: assist to control descent, cues for UE and L LE placement Stand Pivot Transfers: 4: Min assist Stand Pivot Transfer Details (indicate cue type and reason): with RW.  cues for sequencing and safety Ambulation/Gait Ambulation/Gait: No (deferred due to dizziness)  Posture/Postural Control Posture/Postural Control: No significant limitations Static Standing Balance Static Standing - Level of Assistance: 5: Stand by assistance (with B UE support) Exercise  Total Joint Exercises Ankle Circles/Pumps: AROM;Supine;15 reps;Both Quad Sets: Left;AROM;Supine;5 reps (with tactile cues) End of Session PT - End of Session Equipment Utilized During Treatment: Gait belt Activity Tolerance:  (treatment limited by dizziness, nausea at end of session) Patient left: in chair;with call bell in reach;with family/visitor present Nurse Communication: Mobility status for transfers (pt report of nausea/dizziness) General Behavior During Session: [facility] for tasks performed Cognition: [facility] for tasks performed  Sierra Banks 05/09/2011, 10:39 AM

## 2011-05-09 NOTE — Progress Notes (Signed)
OT Cancellation Note  Treatment cancelled today due to medical issues with patient which prohibited therapy. PT not feeling well this am. Will see tomorrow.  [facility] Brenleigh Collet, OTR/L  [phone] 05/09/2011 05/09/2011, 4:47 PM

## 2011-05-09 NOTE — Progress Notes (Signed)
Pt's o2 sats were in upper 70's on 4L of o2 at 1400.  After getting pt to practice deep breathing her sats increased to low 90's.  By 1630, pt was unable to get o2 sats above 88 on 4L, partial rebreather then initiated which got sats to 90, non rebreather then initiated, which got sats to 96%.  Pt was responding appropriately while her o2 levels were low.  Nsg to continue to monitor.

## 2011-05-09 NOTE — Progress Notes (Signed)
[name], PA notified of patient's urine output with foley of 80 ml at 1300 and 150 ml at 2341; last took Furosemide 40 mg at 1709.  Order received:  Increase present IV flds to 90 ml/hr

## 2011-05-09 NOTE — Progress Notes (Signed)
OT Cancellation Note  Treatment cancelled today due to medical issues with patient which prohibited therapy.  [facility] Jayleigh Notarianni, OTR/L  [phone] 05/09/2011 05/09/2011, 4:46 PM

## 2011-05-09 NOTE — Progress Notes (Signed)
[name], MD   [name], PA-C [address] [place], [place], [place]  [address]                             [phone]   PROGRESS NOTE  Subjective:  negative for Chest Pain  negative for Shortness of Breath  negative for Nausea/Vomiting   negative for Calf Pain  negative for Bowel Movement   Tolerating Diet: yes         Patient reports pain as 4 on 0-10 scale.    Objective: Vital signs in last 24 hours:   Patient Vitals for the past 24 hrs:  BP Temp Temp src Pulse Resp SpO2 Height Weight  05/09/11 0627 116/72 mmHg 98.3 F (36.8 C) - 69  19  92 % - -  05/08/11 2229 115/64 mmHg - - - - - - -  05/08/11 2156 115/64 mmHg 97.2 F (36.2 C) - 61  19  93 % - -  05/08/11 1700 - - - - - - 5\' 4"  (1.626 m) 90.719 kg (200 lb)  05/08/11 1420 114/69 mmHg 97 F (36.1 C) Oral 60  16  97 % - -  05/08/11 1300 98/74 mmHg 97 F (36.1 C) - 57  13  95 % - -  05/08/11 1250 108/70 mmHg - - 59  13  95 % - -  05/08/11 1235 119/73 mmHg - - 60  11  95 % - -  05/08/11 1220 117/87 mmHg - - 61  15  98 % - -  05/08/11 1205 118/77 mmHg - - 65  13  95 % - -  05/08/11 1150 118/81 mmHg - - 60  13  93 % - -  05/08/11 1135 131/74 mmHg - - 65  11  94 % - -  05/08/11 1120 133/58 mmHg - - 63  12  92 % - -  05/08/11 1105 99/83 mmHg 97.2 F (36.2 C) - 63  - 91 % - -    @flow {1959:LAST@   Intake/Output from previous day:   03/25 0701 - 03/26 0700 In: 1760 [I.V.:1760] Out: 1665 [Urine:1030; Drains:585]   Intake/Output this shift:       Intake/Output      03/25 0701 - 03/26 0700 03/26 0701 - 03/27 0700   I.V. (mL/kg) 1760 (19.4)    Total Intake(mL/kg) 1760 (19.4)    Urine (mL/kg/hr) 1030 (0.5)    Drains 585    Blood 50    Total Output 1665    Net +95            LABORATORY DATA:  Basename 05/09/11 0525  WBC 11.8*  HGB 9.7*  HCT 30.5*  PLT 239    Basename 05/09/11 0525  NA 137  K 4.1  CL 101  CO2 26  BUN 21  CREATININE 1.46*  GLUCOSE 104*  CALCIUM 8.8   Lab Results    Component Value Date   INR 0.91 04/27/2011    Examination:  General appearance: alert, cooperative and no distress Extremities: Homans sign is negative, no sign of DVT  Wound Exam: clean, dry, intact   Drainage:  Scant/small amount Serosanguinous exudate  Motor Exam: EHL and FHL Intact  Sensory Exam: Deep Peroneal normal  Vascular Exam:    Assessment:    1 Day Post-Op  Procedure(s) (LRB): TOTAL KNEE ARTHROPLASTY (Left)  ADDITIONAL DIAGNOSIS:  Active Problems:  * No active hospital problems. *  Acute Blood Loss Anemia   Plan: Physical Therapy as ordered Weight Bearing as Tolerated (WBAT)  DVT Prophylaxis:  Lovenox  DISCHARGE PLAN: Home  DISCHARGE NEEDS: HHPT, CPM, Walker and 3-in-1 comode seat         Sierra Banks 05/09/2011, 8:33 AM

## 2011-05-10 ENCOUNTER — Inpatient Hospital Stay (HOSPITAL_COMMUNITY): Payer: 59

## 2011-05-10 LAB — BASIC METABOLIC PANEL
BUN: 22 mg/dL (ref 6–23)
CO2: 30 mEq/L (ref 19–32)
Calcium: 9.1 mg/dL (ref 8.4–10.5)
Chloride: 101 mEq/L (ref 96–112)
Creatinine, Ser: 1.16 mg/dL — ABNORMAL HIGH (ref 0.50–1.10)
GFR calc Af Amer: 58 mL/min — ABNORMAL LOW (ref >90)
GFR calc non Af Amer: 50 mL/min — ABNORMAL LOW (ref >90)
Glucose, Bld: 110 mg/dL — ABNORMAL HIGH (ref 70–99)
Potassium: 4.8 mEq/L (ref 3.5–5.1)
Sodium: 137 mEq/L (ref 135–145)

## 2011-05-10 LAB — CBC
HCT: 30 % — ABNORMAL LOW (ref 36.0–46.0)
Hemoglobin: 9.9 g/dL — ABNORMAL LOW (ref 12.0–15.0)
MCH: 29.2 pg (ref 26.0–34.0)
MCHC: 33 g/dL (ref 30.0–36.0)
MCV: 88.5 fL (ref 78.0–100.0)
Platelets: 203 10*3/uL (ref 150–400)
RBC: 3.39 MIL/uL — ABNORMAL LOW (ref 3.87–5.11)
RDW: 14.3 % (ref 11.5–15.5)
WBC: 10.8 10*3/uL — ABNORMAL HIGH (ref 4.0–10.5)

## 2011-05-10 LAB — BLOOD GAS, ARTERIAL
Acid-Base Excess: 2.5 mmol/L — ABNORMAL HIGH (ref 0.0–2.0)
Bicarbonate: 28.1 mEq/L — ABNORMAL HIGH (ref 20.0–24.0)
Drawn by: 24486
FIO2: 1 %
O2 Saturation: 93.8 %
Patient temperature: 98.6
TCO2: 29.8 mmol/L (ref 0–100)
pCO2 arterial: 56.2 mmHg — ABNORMAL HIGH (ref 35.0–45.0)
pH, Arterial: 7.319 — ABNORMAL LOW (ref 7.350–7.400)
pO2, Arterial: 66.6 mmHg — ABNORMAL LOW (ref 80.0–100.0)

## 2011-05-10 LAB — PRO B NATRIURETIC PEPTIDE: Pro B Natriuretic peptide (BNP): 1448 pg/mL — ABNORMAL HIGH (ref 0–125)

## 2011-05-10 MED ORDER — ALBUTEROL SULFATE (5 MG/ML) 0.5% IN NEBU
2.5000 mg | INHALATION_SOLUTION | Freq: Four times a day (QID) | RESPIRATORY_TRACT | Status: DC
  Filled 2011-05-10: qty 0.5

## 2011-05-10 MED ORDER — PIPERACILLIN-TAZOBACTAM 3.375 G IVPB
3.3750 g | Freq: Three times a day (TID) | INTRAVENOUS | Status: DC
  Administered 2011-05-10 – 2011-05-12 (×6): 3.375 g via INTRAVENOUS
  Filled 2011-05-10 (×9): qty 50

## 2011-05-10 MED ORDER — IPRATROPIUM BROMIDE 0.02 % IN SOLN
0.5000 mg | RESPIRATORY_TRACT | Status: DC
  Administered 2011-05-10 – 2011-05-11 (×4): 0.5 mg via RESPIRATORY_TRACT
  Filled 2011-05-10 (×4): qty 2.5

## 2011-05-10 MED ORDER — BIOTENE DRY MOUTH MT LIQD
15.0000 mL | Freq: Two times a day (BID) | OROMUCOSAL | Status: DC
  Administered 2011-05-10 – 2011-05-12 (×4): 15 mL via OROMUCOSAL

## 2011-05-10 MED ORDER — ALBUTEROL SULFATE (5 MG/ML) 0.5% IN NEBU
2.5000 mg | INHALATION_SOLUTION | RESPIRATORY_TRACT | Status: DC
  Administered 2011-05-10 – 2011-05-11 (×4): 2.5 mg via RESPIRATORY_TRACT
  Filled 2011-05-10 (×4): qty 0.5

## 2011-05-10 MED ORDER — IOHEXOL 350 MG/ML SOLN
100.0000 mL | Freq: Once | INTRAVENOUS | Status: AC | PRN
  Administered 2011-05-10: 100 mL via INTRAVENOUS

## 2011-05-10 MED ORDER — FUROSEMIDE 10 MG/ML IJ SOLN
40.0000 mg | Freq: Once | INTRAMUSCULAR | Status: AC
  Administered 2011-05-10: 40 mg via INTRAVENOUS

## 2011-05-10 MED ORDER — FUROSEMIDE 10 MG/ML IJ SOLN
INTRAMUSCULAR | Status: AC
  Administered 2011-05-11: 40 mg via INTRAVENOUS
  Filled 2011-05-10: qty 4

## 2011-05-10 MED ORDER — FUROSEMIDE 10 MG/ML IJ SOLN
40.0000 mg | Freq: Two times a day (BID) | INTRAMUSCULAR | Status: DC
  Administered 2011-05-11 – 2011-05-12 (×4): 40 mg via INTRAVENOUS
  Filled 2011-05-10 (×5): qty 4

## 2011-05-10 MED ORDER — VANCOMYCIN HCL 1000 MG IV SOLR
1250.0000 mg | Freq: Two times a day (BID) | INTRAVENOUS | Status: DC
  Administered 2011-05-10 – 2011-05-12 (×5): 1250 mg via INTRAVENOUS
  Filled 2011-05-10 (×6): qty 1250

## 2011-05-10 MED ORDER — IPRATROPIUM BROMIDE 0.02 % IN SOLN
0.5000 mg | Freq: Four times a day (QID) | RESPIRATORY_TRACT | Status: DC
  Filled 2011-05-10: qty 2.5

## 2011-05-10 MED ORDER — PREDNISONE 50 MG PO TABS
60.0000 mg | ORAL_TABLET | Freq: Every day | ORAL | Status: DC
  Administered 2011-05-10 – 2011-05-11 (×2): 60 mg via ORAL
  Filled 2011-05-10 (×4): qty 1

## 2011-05-10 MED ORDER — BIOTENE DRY MOUTH MT LIQD
15.0000 mL | Freq: Two times a day (BID) | OROMUCOSAL | Status: DC
  Administered 2011-05-11 – 2011-05-12 (×3): 15 mL via OROMUCOSAL

## 2011-05-10 MED ORDER — CHLORHEXIDINE GLUCONATE 0.12 % MT SOLN
15.0000 mL | Freq: Two times a day (BID) | OROMUCOSAL | Status: DC
  Administered 2011-05-10 – 2011-05-12 (×3): 15 mL via OROMUCOSAL
  Filled 2011-05-10 (×3): qty 15

## 2011-05-10 NOTE — Progress Notes (Signed)
OT Cancellation Note  Treatment cancelled today due to medical issues with patient which prohibited therapy. Nsg asked therapy to not see pt at this time. Will attempt again tomorrow.  [facility] Wladyslawa Disbro, OTR/L  [phone] 05/10/2011 05/10/2011, 11:20 AM

## 2011-05-10 NOTE — Progress Notes (Signed)
PT Cancellation Note  Treatment cancelled today due to medical issues with patient which prohibited therapy.  Shahir Mariellen Blaney 05/10/2011, 8:45 AM

## 2011-05-10 NOTE — Consult Note (Signed)
Medical Consultation   Sierra Banks  ZOX:[id number]  DOB: 11/15/1950  DOA: 05/08/2011  PCP: Evette Georges, MD, MD  Requesting physician: Dr. Darcella Cheshire, orthopedics  Reason for consultation: Shortness of breath with oxygen desaturation  History of Present Illness: Patient is a 61 year old female with history of hypertension, hyperlipidemia, on Lasix for peripheral edema (patient states, no history of CHF) currently admitted under orthopedic service, underwent left total knee arthroplasty on 05/08/2011. Postoperative period was uneventful up till last night, when patient started having shortness of breath. This morning patient was noted to have oxygen saturation in low 80's, initially was on 4 L oxygen via nasal cannula and had to be placed on NRB mask.  Patient denies any history of COPD, nicotine use, oxygen use at home. She does snore at night, however has not been diagnosed with any obstructive sleep apnea. Hospitalist service was requested for consultation. Chest x-ray obtained by orthopedics this morning showed patchy right lung pneumonia. Otherwise patient denies any nausea, vomiting, chest pain, palpitations, abdominal pain, diarrhea or constipation. She denies any orthopnea or PND at home.  Allergies:  No Known Allergies    Past Medical History  Diagnosis Date  . 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 HYPERLIPIDEMIA 12/23/2007  . Overweight 01/12/2009  . HYPERTENSION 08/28/2006  . DEPRESSION 08/28/2006  . ABSCESS, BREAST, RIGHT 10/04/2009  . No pertinent past medical history     BRONCHITIS 3 WEEKS  COUGH   . GERD (gastroesophageal reflux disease)     OCC TUMS   . Dysrhythmia     HEART PALPITATIONS   . Hepatitis     JAUNDICE AGE 61    Past Surgical History  Procedure Date  . Abdominal hysterectomy   . Tonsillectomy     1969  . Tubal ligation     1980  . Carpal tunnel release     LEFT  2002  . Dilation and curettage of uterus     2011  . Total knee arthroplasty 05/08/2011   Procedure: TOTAL KNEE ARTHROPLASTY;  Surgeon: Raymon Mutton, MD;  Location: [facility] OR;  Service: Orthopedics;  Laterality: Left;    Social History:  reports that she has never smoked. She does not have any smokeless tobacco history on file. She reports that she does not drink alcohol or use illicit drugs.  History reviewed. No pertinent family history.  Review of Systems:  Review of Systems:  Constitutional: Denies fever, chills, diaphoresis, appetite change and fatigue.  HEENT: Denies photophobia, eye pain, redness, hearing loss, ear pain, congestion, sore throat, rhinorrhea, sneezing, mouth sores, trouble swallowing, neck pain, neck stiffness and tinnitus.   Respiratory: See history of present illness .   Cardiovascular: Denies chest pain, palpitations, states has a history of peripheral edema and takes Lasix for it at home .  Gastrointestinal: Denies nausea, vomiting, abdominal pain, diarrhea, constipation, blood in stool and abdominal distention.  Genitourinary: Denies dysuria, urgency, frequency, hematuria, flank pain and difficulty urinating.  Musculoskeletal: Denies myalgias, back pain, joint swelling, arthralgias and gait problem.  Skin: Denies pallor, rash and wound.  Neurological: Denies dizziness, seizures, syncope, weakness, light-headedness, numbness and headaches.  Hematological: Denies adenopathy. Easy bruising, personal or family bleeding history  Psychiatric/Behavioral: Denies suicidal ideation, mood changes, confusion, nervousness, sleep disturbance and agitation   Physical Exam: Blood pressure 138/79, pulse 68, temperature 98.8 F (37.1 C), temperature source Oral, resp. rate 18, height 5\' 4"  (1.626 m), weight 90.719 kg (200 lb), SpO2 97.00%.  General: Alert and awake, oriented x3, at the  time of my examination, off NRB mask, eating lunch, talking in full sentences HEENT: anicteric sclera, pupils reactive to light and accommodation, EOMI CVS: S1-S2 clear, no murmur rubs  or gallops Chest: Diffuse wheezing bilaterally with bibasilar crackles Abdomen: soft nontender, nondistended, normal bowel sounds, no organomegaly Extremities: no cyanosis, clubbing or edema noted bilaterally Neuro: Cranial nerves II-XII intact, no focal neurological deficits  Labs on Admission:  Basic Metabolic Panel:  Lab 05/10/11 4098 05/09/11 0525  NA 137 137  K 4.8 4.1  CL 101 101  CO2 30 26  GLUCOSE 110* 104*  BUN 22 21  CREATININE 1.16* 1.46*  CALCIUM 9.1 8.8  MG -- --  PHOS -- --   CBC:  Lab 05/10/11 0540 05/09/11 0525  WBC 10.8* 11.8*  NEUTROABS -- --  HGB 9.9* 9.7*  HCT 30.0* 30.5*  MCV 88.5 88.2  PLT 203 239    Inpatient Medications:   Scheduled Meds:    . albuterol  2.5 mg Nebulization Q4H  . amLODipine  10 mg Oral Daily  . atenolol  100 mg Oral Daily  . buPROPion  75 mg Oral Daily  . celecoxib  200 mg Oral BID  . docusate sodium  100 mg Oral BID  . enoxaparin  30 mg Subcutaneous Q12H  . furosemide  40 mg Intravenous Once  . furosemide  40 mg Oral Once  . furosemide  80 mg Oral Daily  . ipratropium  0.5 mg Nebulization Q4H  . lisinopril  80 mg Oral Daily  . oxyCODONE  10 mg Oral Q12H  . piperacillin-tazobactam (ZOSYN)  IV  3.375 g Intravenous Q8H  . simvastatin  40 mg Oral QHS  . vancomycin  1,250 mg Intravenous Q12H  . venlafaxine  300 mg Oral QHS  . DISCONTD: albuterol  2.5 mg Nebulization Q6H  . DISCONTD: furosemide  40 mg Oral Daily  . DISCONTD: ipratropium  0.5 mg Nebulization Q6H   Continuous Infusions:    . bupivacaine ON-Q pain pump    . DISCONTD: sodium chloride 90 mL/hr at 05/10/11 1051     Radiological Exams on Admission: Dg Chest 2 View  05/09/2011  *RADIOLOGY REPORT*  Clinical Data: SOB, wheezing  CHEST - 2 VIEW  Comparison: 04/27/2011  Findings: Low lung volumes.  Patchy right perihilar/mid lung opacity, suspicious for pneumonia.  Additional bibasilar opacities, possibly atelectasis.  No pleural effusion or pneumothorax.   Cardiomediastinal silhouette is within normal limits.  Degenerative changes of the visualized thoracolumbar spine.  IMPRESSION: Patchy right perihilar/midlung opacity, suspicious for pneumonia.  Original Report Authenticated By: Charline Bills, M.D.   Ct Angio Chest W/cm &/or Wo Cm  05/10/2011  *RADIOLOGY REPORT*  Clinical Data: New onset shortness of breath and fluid retention after recent knee surgery.  CT ANGIOGRAPHY CHEST  Technique:  Multidetector CT imaging of the chest using the standard protocol during bolus administration of intravenous contrast. Multiplanar reconstructed images including MIPs were obtained and reviewed to evaluate the vascular anatomy.  Contrast: OMNIPAQUE IOHEXOL 350 MG/ML IV SOLN  Comparison: Chest radiograph 05/08/1937.  Findings: Evaluation for segmental and subsegmental pulmonary emboli is limited in the lower lobes due to suboptimal opacification and severe respiratory motion.  Otherwise, no pulmonary embolus.  Mediastinal lymph nodes measure up to 1.5 cm in the subcarinal station.  Bihilar lymphoid tissue.  No axillary adenopathy.  Heart is mildly enlarged.  No pericardial effusion.  Again, image quality is degraded by respiratory motion, limiting evaluation of the lungs.  Added parenchymal density bilaterally  is indicative of expiratory phase imaging.  However, there is air space consolidation in both lower lobes, left greater than right. No pleural fluid.  Airway is otherwise unremarkable.  Incidental imaging of the upper abdomen shows low attenuation throughout the visualized portion of the liver, with sparing along the gallbladder fossa.  Upper abdominal lymph nodes are not enlarged by CT size criteria.  No worrisome lytic or sclerotic lesions.  IMPRESSION:  1.  Evaluation for segmental and subsegmental pulmonary emboli in the lower lobes is precluded by severe respiratory motion and suboptimal opacification.  Otherwise, no pulmonary embolus. 2.  Bilateral lower lobe  air space consolidation, left greater than right, indicative of pneumonia. 3.  Hepatic steatosis.  Original Report Authenticated By: Reyes Ivan, M.D.    Impression/Recommendations Active Problems: Acute hypoxic respiratory failure: Likely secondary to pneumonia (possibly aspiration pneumonia after general anesthesia), fluid overload, acute bronchitis, rule out acute PE, ? History of CHF - After my evaluation of the patient, I had ordered stat BNP, stat CT angiogram chest, ABG, DC IV fluids, Lasix 40 mg IV x1, stat albuterol/Atrovent nebulizer treatments - CT angiogram of the chest was reviewed, no pulmonary embolus although patient has bilateral lower lobe airspace consolidation, blood cultures were drawn, started the patient on vancomycin and Zosyn (? Could be aspiration pneumonitis from general anesthesia) - Placed on every 4 hours albuterol/Atrovent nebulizer treatments, incentive spirometry, prednisone - BNP elevated at 1448, gave 40 mg IV Lasix x1, started on 40 mg IV BID day from tomorrow, I will check 2-D echo as well to assess EF or any WMA - strongly recommend to decrease the narcotics given hypoxic respiratory failure, also asked the RN to check the pulse ox as patient is still conversing in full sentences despite the O2 sats in low 80s - recimmend to place on telemetry monitor, low threshold to transfer to SDU if respiratory status deteriorates   Hypertension: Currently stable, continue current medications  DVT prophylaxis: Lovenox  I will followup again in the morning. Please contact me if I can be of assistance in the meanwhile. Thank you for this consultation.  Time Spent on Admission: 1 hour  Dawnyel Leven M.D. [facility] 05/10/2011, 2:06 PM

## 2011-05-10 NOTE — Discharge Summary (Signed)
PATIENT ID:      Sierra Banks  MRN:     [id number] DOB/AGE:    February 23, 1950 / 61 y.o.     DISCHARGE SUMMARY  ADMISSION DATE:    05/08/2011 DISCHARGE DATE:   05/10/2011   ADMISSION DIAGNOSIS: osteoarthritis left knee  (osteoarthritis left knee)  DISCHARGE DIAGNOSIS:  osteoarthritis left knee    ADDITIONAL DIAGNOSIS: Active Problems:  * No active hospital problems. *   Past Medical History  Diagnosis Date  . 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 HYPERLIPIDEMIA 12/23/2007  . Overweight 01/12/2009  . HYPERTENSION 08/28/2006  . DEPRESSION 08/28/2006  . ABSCESS, BREAST, RIGHT 10/04/2009  . No pertinent past medical history     BRONCHITIS 3 WEEKS  COUGH   . GERD (gastroesophageal reflux disease)     OCC TUMS   . Dysrhythmia     HEART PALPITATIONS   . Hepatitis     JAUNDICE AGE 74    PROCEDURE: Procedure(s): TOTAL KNEE ARTHROPLASTY on 05/08/2011  CONSULTS: Treatment Team:  Ripudeep Jenna Luo, MD   HISTORY:Patient is a 61 y.o. female presented with a history of pain in the left knee. Onset of symptoms was gradual starting several years ago with gradually worsening course since that time. The patient noted no past surgery on the left knee. Prior procedures on the knee include arthroscopy. Patient has been treated conservatively with over-the-counter NSAIDs and activity modification. Patient currently rates pain in the knee at 8 out of 10 with activity. There is pain at night.   HOSPITAL COURSE:  Sierra Banks is a 61 y.o. admitted on 05/08/2011 and found to have a diagnosis of osteoarthritis left knee.  After appropriate laboratory studies were obtained  they were taken to the operating room on 05/08/2011 and underwent Procedure(s): TOTAL KNEE ARTHROPLASTY.   They were given perioperative antibiotics:  Anti-infectives     Start     Dose/Rate Route Frequency Ordered Stop   05/10/11 1400  piperacillin-tazobactam (ZOSYN) IVPB 3.375 g       3.375 g 12.5 mL/hr over 240 Minutes Intravenous 3 times per day  05/10/11 1101     05/10/11 1200   vancomycin (VANCOCIN) 1,250 mg in sodium chloride 0.9 % 250 mL IVPB        1,250 mg 166.7 mL/hr over 90 Minutes Intravenous Every 12 hours 05/10/11 1101     05/08/11 1600   ceFAZolin (ANCEF) IVPB 2 g/50 mL premix        2 g 100 mL/hr over 30 Minutes Intravenous Every 6 hours 05/08/11 1438 05/09/11 0453   05/07/11 1001   ceFAZolin (ANCEF) IVPB 2 g/50 mL premix        2 g 100 mL/hr over 30 Minutes Intravenous 60 min pre-op 05/07/11 1001 05/08/11 0858        .  Tolerated the procedure well.  Placed with a foley intraoperatively.  Given Ofirmev at induction and for 48 hours.  PCA for analgesia.   POD #1, allowed out of bed to a chair.  PT for ambulation and exercise program.  Foley D/C'd in morning.  IV saline locked.  O2 discontionued.  POD #2, continued PT and ambulation .  The remainder of the hospital course was dedicated to ambulation and strengthening.   The patient was discharged on 2 Days Post-Op in  Good condition.  Blood products given:none  DIAGNOSTIC STUDIES: Recent vital signs: Patient Vitals for the past 24 hrs:  BP Temp Temp src Pulse Resp SpO2  05/10/11 1400 127/71 mmHg  98 F (36.7 C) - 73  20  96 %  05/10/11 0900 138/79 mmHg - - 68  - -  05/10/11 0828 130/76 mmHg 98.8 F (37.1 C) Oral 76  18  97 %  05/10/11 0619 121/55 mmHg 98.5 F (36.9 C) - 75  18  95 %  May 24, 2011 2204 126/73 mmHg 98.3 F (36.8 C) - 75  20  97 %  May 24, 2011 1800 - - - - - 94 %  05/24/2011 1700 - - - - - 90 %  24-May-2011 1630 - - - - - 88 %       Recent laboratory studies:  Basename 05/10/11 0540 05/24/2011 0525  WBC 10.8* 11.8*  HGB 9.9* 9.7*  HCT 30.0* 30.5*  PLT 203 239    Basename 05/10/11 0540 2011-05-24 0525  NA 137 137  K 4.8 4.1  CL 101 101  CO2 30 26  BUN 22 21  CREATININE 1.16* 1.46*  GLUCOSE 110* 104*  CALCIUM 9.1 8.8   Lab Results  Component Value Date   INR 0.91 04/27/2011     Recent Radiographic Studies :  Dg Chest 2  View  24-May-2011  *RADIOLOGY REPORT*  Clinical Data: SOB, wheezing  CHEST - 2 VIEW  Comparison: 04/27/2011  Findings: Low lung volumes.  Patchy right perihilar/mid lung opacity, suspicious for pneumonia.  Additional bibasilar opacities, possibly atelectasis.  No pleural effusion or pneumothorax.  Cardiomediastinal silhouette is within normal limits.  Degenerative changes of the visualized thoracolumbar spine.  IMPRESSION: Patchy right perihilar/midlung opacity, suspicious for pneumonia.  Original Report Authenticated By: Charline Bills, M.D.   Dg Chest 2 View  04/27/2011  *RADIOLOGY REPORT*  Clinical Data: Preoperative evaluation for left total knee arthroplasty.  Hypertension, gastroesophageal reflux disease, dysrhythmia and dry cough.  Nonsmoker  CHEST - 2 VIEW  Comparison: 05/28/2006  Findings: Lung volumes are slightly diminished. Taking this into consideration, heart size is mildly enlarged and stable. A stable mediastinal contour is seen.  The lung fields appear clear with no signs of focal infiltrate or congestive failure.  No pleural fluid or significant peribronchial cuffing is seen.  No pleural fluid is identified.  Bony structures appear intact.  IMPRESSION: Stable mild cardiac enlargement.  No new worrisome focal or acute cardiopulmonary abnormality noted.  Original Report Authenticated By: Bertha Stakes, M.D.   Ct Angio Chest W/cm &/or Wo Cm  05/10/2011  *RADIOLOGY REPORT*  Clinical Data: New onset shortness of breath and fluid retention after recent knee surgery.  CT ANGIOGRAPHY CHEST  Technique:  Multidetector CT imaging of the chest using the standard protocol during bolus administration of intravenous contrast. Multiplanar reconstructed images including MIPs were obtained and reviewed to evaluate the vascular anatomy.  Contrast: OMNIPAQUE IOHEXOL 350 MG/ML IV SOLN  Comparison: Chest radiograph 05/23/37.  Findings: Evaluation for segmental and subsegmental pulmonary emboli is  limited in the lower lobes due to suboptimal opacification and severe respiratory motion.  Otherwise, no pulmonary embolus.  Mediastinal lymph nodes measure up to 1.5 cm in the subcarinal station.  Bihilar lymphoid tissue.  No axillary adenopathy.  Heart is mildly enlarged.  No pericardial effusion.  Again, image quality is degraded by respiratory motion, limiting evaluation of the lungs.  Added parenchymal density bilaterally is indicative of expiratory phase imaging.  However, there is air space consolidation in both lower lobes, left greater than right. No pleural fluid.  Airway is otherwise unremarkable.  Incidental imaging of the upper abdomen shows low attenuation throughout the visualized portion  of the liver, with sparing along the gallbladder fossa.  Upper abdominal lymph nodes are not enlarged by CT size criteria.  No worrisome lytic or sclerotic lesions.  IMPRESSION:  1.  Evaluation for segmental and subsegmental pulmonary emboli in the lower lobes is precluded by severe respiratory motion and suboptimal opacification.  Otherwise, no pulmonary embolus. 2.  Bilateral lower lobe air space consolidation, left greater than right, indicative of pneumonia. 3.  Hepatic steatosis.  Original Report Authenticated By: Reyes Ivan, M.D.    DISCHARGE INSTRUCTIONS:   DISCHARGE MEDICATIONS:   Medication List  As of 05/10/2011  4:29 PM   ASK your doctor about these medications         amLODipine 10 MG tablet   Commonly known as: NORVASC   Take 10 mg by mouth daily.      aspirin EC 325 MG tablet   Take 325 mg by mouth daily.      atenolol 100 MG tablet   Commonly known as: TENORMIN   Take 100 mg by mouth daily.      buPROPion 75 MG tablet   Commonly known as: WELLBUTRIN   Take 75 mg by mouth 1 day or 1 dose.      CO Q 10 PO   Take 1 capsule by mouth daily.      fish oil-omega-3 fatty acids 1000 MG capsule   Take 1 g by mouth daily.      furosemide 40 MG tablet   Commonly known as:  LASIX   Take 40 mg by mouth daily.      lisinopril 40 MG tablet   Commonly known as: PRINIVIL,ZESTRIL   Take 80 mg by mouth daily.      mulitivitamin with minerals Tabs   Take 1 tablet by mouth daily.      simvastatin 40 MG tablet   Commonly known as: ZOCOR   Take 40 mg by mouth at bedtime.      venlafaxine 150 MG 24 hr capsule   Commonly known as: EFFEXOR-XR   Take 300 mg by mouth at bedtime.            FOLLOW UP VISIT:    DISPOSITION:  Home    CONDITION:  Good   Maximino Cozzolino 05/10/2011, 4:29 PM

## 2011-05-10 NOTE — Progress Notes (Signed)
  [name], MD   [name], PA-C [address] [place], [place], [place]  [address]                             [phone]   PROGRESS NOTE  Subjective:  negative for Chest Pain  positive for Shortness of Breath  negative for Nausea/Vomiting   negative for Calf Pain  negative for Bowel Movement   Tolerating Diet: yes         Patient reports pain as 6 on 0-10 scale.    Objective: Vital signs in last 24 hours:   Patient Vitals for the past 24 hrs:  BP Temp Temp src Pulse Resp SpO2  05/10/11 0900 138/79 mmHg - - 68  - -  05/10/11 0828 130/76 mmHg 98.8 F (37.1 C) Oral 76  18  97 %  05/10/11 0619 121/55 mmHg 98.5 F (36.9 C) - 75  18  95 %  05/09/11 2204 126/73 mmHg 98.3 F (36.8 C) - 75  20  97 %  05/09/11 1800 - - - - - 94 %  05/09/11 1700 - - - - - 90 %  05/09/11 1630 - - - - - 88 %  05/09/11 1400 110/63 mmHg 97.4 F (36.3 C) - 68  20  -    @flow {1959:LAST@   Intake/Output from previous day:   03/26 0701 - 03/27 0700 In: 2551.5 [P.O.:720; I.V.:1831.5] Out: 750 [Urine:750]   Intake/Output this shift:       Intake/Output      03/26 0701 - 03/27 0700 03/27 0701 - 03/28 0700   P.O. 720    I.V. (mL/kg) 1831.5 (20.2)    Total Intake(mL/kg) 2551.5 (28.1)    Urine (mL/kg/hr) 750 (0.3)    Drains     Blood     Total Output 750    Net +1801.5         Urine Occurrence 200 x       LABORATORY DATA:  Basename 05/10/11 0540 05/09/11 0525  WBC 10.8* 11.8*  HGB 9.9* 9.7*  HCT 30.0* 30.5*  PLT 203 239    Basename 05/10/11 0540 05/09/11 0525  NA 137 137  K 4.8 4.1  CL 101 101  CO2 30 26  BUN 22 21  CREATININE 1.16* 1.46*  GLUCOSE 110* 104*  CALCIUM 9.1 8.8   Lab Results  Component Value Date   INR 0.91 04/27/2011    Examination:  General appearance: alert, cooperative and no distress Resp: clear to auscultation bilaterally Extremities: Homans sign is negative, no sign of DVT  Wound Exam: clean, dry, intact   Drainage:  None: wound tissue  dry  Motor Exam: EHL and FHL Intact  Sensory Exam: Deep Peroneal normal  Vascular Exam:    Assessment:    2 Days Post-Op  Procedure(s) (LRB): TOTAL KNEE ARTHROPLASTY (Left)  ADDITIONAL DIAGNOSIS:  Active Problems:  * No active hospital problems. *   Acute Blood Loss Anemia   Plan: Physical Therapy as ordered Weight Bearing as Tolerated (WBAT)  DVT Prophylaxis:  Lovenox  DISCHARGE PLAN: Home  DISCHARGE NEEDS: HHPT, CPM, Walker and 3-in-1 comode seat  Sats dropping, hospitalists called to r/o PE         Toba Claudio 05/10/2011, 11:46 AM

## 2011-05-10 NOTE — Progress Notes (Signed)
While on rounds alerted to patient on 100% NRB mask who just came from CT scan to r/o PE.  CT scan results reviewed.   Patient sitting in bed NAD.  Denies SOB or chest pain. C/O only of sleepiness which she states she has been noticing at home on and off for several months along with fatigue. Respirations regular and unlabored.  Bilateral BS present - patient has long expiratory phase - some adventious sounds with end expiration  Some upper airway exp wheezing noted.  No rales noted.  Generalized edema noted - patient she has noticed this increasing over last few weeks.  BP 105/64  HR 72 regular.  O2 sats 92- 96% on NRB mask.  Patient desats to 80's without mask but patient is asymptomatic.  ABG reviewed.  Reviewed Dr. Fransico Him note.  Spoke with Dr. Isidoro Donning with concerns.  Requested patinet to be transferred to SDU.  Dr. Isidoro Donning requested that primary MD transfer patient.  Spoke with [name] PA for Dr. Sherlean Foot.  Orders to transfer.  Patient remains in no distress.  Will follow as needed.  Patient and family updated.

## 2011-05-10 NOTE — Progress Notes (Signed)
ANTIBIOTIC CONSULT NOTE - INITIAL  Pharmacy Consult for Vancomycin / Zosyn Indication: pneumonia  No Known Allergies  Patient Measurements: Height: 5\' 4"  (162.6 cm) Weight: 200 lb (90.719 kg) IBW/kg (Calculated) : 54.7    Vital Signs: Temp: 98.8 F (37.1 C) (03/27 0828) Temp src: Oral (03/27 0828) BP: 138/79 mmHg (03/27 0900) Pulse Rate: 68  (03/27 0900) Intake/Output from previous day: 03/26 0701 - 03/27 0700 In: 2551.5 [P.O.:720; I.V.:1831.5] Out: 750 [Urine:750] Intake/Output from this shift:    Labs:  Basename 05/10/11 0540 05/09/11 0525  WBC 10.8* 11.8*  HGB 9.9* 9.7*  PLT 203 239  LABCREA -- --  CREATININE 1.16* 1.46*   Estimated Creatinine Clearance: 55.6 ml/min (by C-G formula based on Cr of 1.16). No results found for this basename: VANCOTROUGH:2,VANCOPEAK:2,VANCORANDOM:2,GENTTROUGH:2,GENTPEAK:2,GENTRANDOM:2,TOBRATROUGH:2,TOBRAPEAK:2,TOBRARND:2,AMIKACINPEAK:2,AMIKACINTROU:2,AMIKACIN:2, in the last 72 hours   Microbiology: Recent Results (from the past 720 hour(s))  URINE CULTURE     Status: Normal   Collection Time   04/27/11 11:36 AM      Component Value Range Status Comment   Specimen Description URINE, CLEAN CATCH   Final    Special Requests NONE   Final    Culture  Setup Time [id number]   Final    Colony Count NO GROWTH   Final    Culture NO GROWTH   Final    Report Status 04/28/2011 FINAL   Final   SURGICAL PCR SCREEN     Status: Normal   Collection Time   04/27/11 11:37 AM      Component Value Range Status Comment   MRSA, PCR NEGATIVE  NEGATIVE  Final    Staphylococcus aureus NEGATIVE  NEGATIVE  Final     Medical History: Past Medical History  Diagnosis Date  . 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 HYPERLIPIDEMIA 12/23/2007  . Overweight 01/12/2009  . HYPERTENSION 08/28/2006  . DEPRESSION 08/28/2006  . ABSCESS, BREAST, RIGHT 10/04/2009  . No pertinent past medical history     BRONCHITIS 3 WEEKS  COUGH   . GERD (gastroesophageal reflux disease)     OCC TUMS    . Dysrhythmia     HEART PALPITATIONS   . Hepatitis     JAUNDICE AGE 47    Medications:  Scheduled:    . amLODipine  10 mg Oral Daily  . atenolol  100 mg Oral Daily  . buPROPion  75 mg Oral Daily  . celecoxib  200 mg Oral BID  . docusate sodium  100 mg Oral BID  . enoxaparin  30 mg Subcutaneous Q12H  . furosemide  40 mg Oral Once  . furosemide  80 mg Oral Daily  . lisinopril  80 mg Oral Daily  . methocarbamol (ROBAXIN) IV  500 mg Intravenous To PACU  . oxyCODONE  10 mg Oral Q12H  . simvastatin  40 mg Oral QHS  . venlafaxine  300 mg Oral QHS  . DISCONTD: furosemide  40 mg Oral Daily   Assessment: 61 year old admitted 3.25.13 for total knee replacement, starting antibiotics for suspected pneumonia with drop in oxygen saturation.  CrCl=56 ml/min  Goal of Therapy:  Vancomycin trough = 15 - 20 mcg / dL Appropriate Zosyn dosing  Plan:  1) Zosyn 3.375 grams iv Q 8 hours - 4 hour infusion 2) Vancomycin 1250 mg IV Q 12 hours 3) Follow up plan, Scr  Okey Regal, PharmD Clinical Pharmacist  [phone] 05/10/2011,10:56 AM

## 2011-05-11 DIAGNOSIS — J9601 Acute respiratory failure with hypoxia: Secondary | ICD-10-CM | POA: Diagnosis present

## 2011-05-11 DIAGNOSIS — I10 Essential (primary) hypertension: Secondary | ICD-10-CM | POA: Diagnosis present

## 2011-05-11 DIAGNOSIS — R600 Localized edema: Secondary | ICD-10-CM | POA: Diagnosis present

## 2011-05-11 DIAGNOSIS — F329 Major depressive disorder, single episode, unspecified: Secondary | ICD-10-CM | POA: Diagnosis present

## 2011-05-11 DIAGNOSIS — F32A Depression, unspecified: Secondary | ICD-10-CM | POA: Diagnosis present

## 2011-05-11 LAB — BASIC METABOLIC PANEL
BUN: 23 mg/dL (ref 6–23)
CO2: 29 mEq/L (ref 19–32)
Calcium: 9.4 mg/dL (ref 8.4–10.5)
Chloride: 100 mEq/L (ref 96–112)
Creatinine, Ser: 1.13 mg/dL — ABNORMAL HIGH (ref 0.50–1.10)
GFR calc Af Amer: 60 mL/min — ABNORMAL LOW (ref >90)
GFR calc non Af Amer: 51 mL/min — ABNORMAL LOW (ref >90)
Glucose, Bld: 180 mg/dL — ABNORMAL HIGH (ref 70–99)
Potassium: 4 mEq/L (ref 3.5–5.1)
Sodium: 137 mEq/L (ref 135–145)

## 2011-05-11 LAB — POCT I-STAT 3, ART BLOOD GAS (G3+)
Acid-Base Excess: 1 mmol/L (ref 0.0–2.0)
Bicarbonate: 27.9 mEq/L — ABNORMAL HIGH (ref 20.0–24.0)
Bicarbonate: 25.9 mEq/L — ABNORMAL HIGH (ref 20.0–24.0)
O2 Saturation: 97 %
O2 Saturation: 88 %
Patient temperature: 97.2
TCO2: 30 mmol/L (ref 0–100)
TCO2: 27 mmol/L (ref 0–100)
pCO2 arterial: 56.3 mmHg — ABNORMAL HIGH (ref 35.0–45.0)
pCO2 arterial: 46.5 mmHg — ABNORMAL HIGH (ref 35.0–45.0)
pH, Arterial: 7.303 — ABNORMAL LOW (ref 7.350–7.400)
pH, Arterial: 7.35 (ref 7.350–7.400)
pO2, Arterial: 104 mmHg — ABNORMAL HIGH (ref 80.0–100.0)
pO2, Arterial: 55 mmHg — ABNORMAL LOW (ref 80.0–100.0)

## 2011-05-11 LAB — CBC
HCT: 27 % — ABNORMAL LOW (ref 36.0–46.0)
Hemoglobin: 8.8 g/dL — ABNORMAL LOW (ref 12.0–15.0)
MCH: 28.9 pg (ref 26.0–34.0)
MCHC: 32.6 g/dL (ref 30.0–36.0)
MCV: 88.5 fL (ref 78.0–100.0)
Platelets: 190 10*3/uL (ref 150–400)
RBC: 3.05 MIL/uL — ABNORMAL LOW (ref 3.87–5.11)
RDW: 14.3 % (ref 11.5–15.5)
WBC: 8.4 10*3/uL (ref 4.0–10.5)

## 2011-05-11 MED ORDER — CELECOXIB 200 MG PO CAPS: 200.0000 mg | ORAL_CAPSULE | Freq: Two times a day (BID) | ORAL | Status: AC

## 2011-05-11 MED ORDER — ENOXAPARIN SODIUM 40 MG/0.4ML ~~LOC~~ SOLN: 40.0000 mg | Freq: Every day | SUBCUTANEOUS | Status: DC

## 2011-05-11 MED ORDER — ALBUTEROL SULFATE (5 MG/ML) 0.5% IN NEBU: 2.5000 mg | INHALATION_SOLUTION | Freq: Four times a day (QID) | RESPIRATORY_TRACT | Status: DC | PRN

## 2011-05-11 MED ORDER — OXYCODONE HCL 5 MG PO TABS: 5.0000 mg | ORAL_TABLET | ORAL | Status: AC | PRN

## 2011-05-11 MED ORDER — METHOCARBAMOL 500 MG PO TABS: 500.0000 mg | ORAL_TABLET | Freq: Four times a day (QID) | ORAL | Status: AC | PRN

## 2011-05-11 NOTE — Progress Notes (Signed)
Sierra Spurling, MD   Sierra Cabal, PA-C [address] [place], [place], [place]  [address]                             [phone]   PROGRESS NOTE  Subjective:  negative for Chest Pain  positive for Shortness of Breath  negative for Nausea/Vomiting   negative for Calf Pain  negative for Bowel Movement   Tolerating Diet: yes         Patient reports pain as 5 on 0-10 scale.    Objective: Vital signs in last 24 hours:   Patient Vitals for the past 24 hrs:  BP Temp Temp src Pulse Resp SpO2 Height Weight  05/11/11 1210 - 97.9 F (36.6 C) Oral - - - - -  05/11/11 1100 116/84 mmHg - - 82  20  93 % - -  05/11/11 1000 122/63 mmHg - - 80  17  94 % - -  05/11/11 0900 125/57 mmHg - - 80  14  96 % - -  05/11/11 0800 124/51 mmHg - - 82  18  98 % - -  05/11/11 0705 - 98.5 F (36.9 C) Oral 76  12  97 % - -  05/11/11 0701 125/63 mmHg - - - - - - -  05/11/11 0700 125/63 mmHg - - 75  15  97 % - -  05/11/11 0600 122/44 mmHg - - 71  14  95 % - -  05/11/11 0500 121/62 mmHg - - 69  12  95 % - -  05/11/11 0400 111/57 mmHg 98 F (36.7 C) Oral 70  10  95 % - -  05/11/11 0340 - - - - - 97 % - -  05/11/11 0300 97/48 mmHg - - 68  13  96 % - -  05/11/11 0200 113/54 mmHg - - 65  14  93 % - -  05/11/11 0000 100/47 mmHg 97.2 F (36.2 C) Oral 66  13  100 % - -  05/10/11 2348 - - - - - 91 % - -  05/10/11 2300 105/49 mmHg - - 60  11  93 % - -  05/10/11 2200 112/63 mmHg - - 61  11  92 % - -  05/10/11 2100 - - - 70  14  97 % - -  05/10/11 2036 - - - - - 94 % - -  05/10/11 2000 106/55 mmHg - - 64  17  100 % - -  05/10/11 1952 - 97.8 F (36.6 C) Oral - - - - -  05/10/11 1845 113/59 mmHg - - 63  12  100 % - -  05/10/11 1815 102/68 mmHg 97.8 F (36.6 C) Oral - - - 5\' 4"  (1.626 m) 92.987 kg (205 lb)  05/10/11 1400 127/71 mmHg 98 F (36.7 C) - 73  20  96 % - -    @flow {1959:LAST@   Intake/Output from previous day:   03/27 0701 - 03/28 0700 In: 865 [P.O.:240] Out: 2300 [Urine:2300]   Intake/Output  this shift:   03/28 0701 - 03/28 1900 In: 254  Out: -    Intake/Output      03/27 0701 - 03/28 0700 03/28 0701 - 03/29 0700   P.O. 240    I.V. (mL/kg)     IV Piggyback 625 254   Total Intake(mL/kg) 865 (9.3) 254 (2.7)   Urine (mL/kg/hr) 2300 (1)    Total  Output 2300    Net -1435 +254           LABORATORY DATA:  Basename 05/11/11 0603 05/10/11 0540 05/09/11 0525  WBC 8.4 10.8* 11.8*  HGB 8.8* 9.9* 9.7*  HCT 27.0* 30.0* 30.5*  PLT 190 203 239    Basename 05/11/11 0603 05/10/11 0540 05/09/11 0525  NA 137 137 137  K 4.0 4.8 4.1  CL 100 101 101  CO2 29 30 26   BUN 23 22 21   CREATININE 1.13* 1.16* 1.46*  GLUCOSE 180* 110* 104*  CALCIUM 9.4 9.1 8.8   Lab Results  Component Value Date   INR 0.91 04/27/2011    Examination:  General appearance: alert, cooperative and no distress Resp: wheezes LLL and RLL Extremities: Homans sign is negative, no sign of DVT  Wound Exam: clean, dry, intact   Drainage:  None: wound tissue dry  Motor Exam: EHL and FHL Intact  Sensory Exam: Deep Peroneal normal  Vascular Exam:    Assessment:    3 Days Post-Op  Procedure(s) (LRB): TOTAL KNEE ARTHROPLASTY (Left)  ADDITIONAL DIAGNOSIS:  Active Problems:  * No active hospital problems. *   Acute Blood Loss Anemia Fluid retention pneumonia   Plan: Physical Therapy as ordered Weight Bearing as Tolerated (WBAT)  DVT Prophylaxis:  Lovenox  DISCHARGE PLAN: Home once cleared by medicine  DISCHARGE NEEDS: HHPT, CPM, Walker and 3-in-1 comode seat         Sierra Banks 05/11/2011, 1:30 PM

## 2011-05-11 NOTE — Evaluation (Signed)
Occupational Therapy Evaluation Patient Details Name: Sierra Banks MRN: [id number] DOB: 1950-07-17 Today's Date: 05/11/2011  Problem List:  Patient Active Problem List  Diagnoses  . HSV  . HYPERLIPIDEMIA  . OVERWEIGHT  . DEPRESSION  . HYPERTENSION  . ABSCESS, BREAST, RIGHT  . Postmenopausal bleeding  . Skin tag    Past Medical History:  Past Medical History  Diagnosis Date  . 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 HYPERLIPIDEMIA 12/23/2007  . Overweight 01/12/2009  . HYPERTENSION 08/28/2006  . DEPRESSION 08/28/2006  . ABSCESS, BREAST, RIGHT 10/04/2009  . No pertinent past medical history     BRONCHITIS 3 WEEKS  COUGH   . GERD (gastroesophageal reflux disease)     OCC TUMS   . Dysrhythmia     HEART PALPITATIONS   . Hepatitis     JAUNDICE AGE 14   Past Surgical History:  Past Surgical History  Procedure Date  . Abdominal hysterectomy   . Tonsillectomy     1969  . Tubal ligation     1980  . Carpal tunnel release     LEFT  2002  . Dilation and curettage of uterus     2011  . Total knee arthroplasty 05/08/2011    Procedure: TOTAL KNEE ARTHROPLASTY;  Surgeon: Raymon Mutton, MD;  Location: [facility] OR;  Service: Orthopedics;  Laterality: Left;    OT Assessment/Plan/Recommendation OT Assessment Clinical Impression Statement: Pt admitted for Lt TKA which was complicated by decreasing sats (into 70's) found to possibly be secondary to PNA. Pt presents with below problem list and will benefit from skilled OT in the acute setting to maximize I with ADL and ADL mobility to Mod I-S level upon d/c home OT Recommendation/Assessment: Patient will need skilled OT in the acute care venue OT Problem List: Decreased activity tolerance;Decreased knowledge of use of DME or AE;Decreased knowledge of precautions;Cardiopulmonary status limiting activity;Pain OT Therapy Diagnosis : Generalized weakness;Acute pain OT Plan OT Frequency: Min 2X/week OT Treatment/Interventions: Self-care/ADL training;Therapeutic  activities;Patient/family education;Balance training OT Recommendation Follow Up Recommendations: No post acute OT follow up;Supervision - Intermittent Equipment Recommended: Rolling walker with 5" wheels Individuals Consulted Consulted and Agree with Results and Recommendations: Patient;Family member/caregiver Family Member Consulted: husband OT Goals Acute Rehab OT Goals OT Goal Formulation: With patient/family Time For Goal Achievement: 7 days ADL Goals Pt Will Perform Grooming: with modified independence;Standing at sink ADL Goal: Grooming - Progress: Goal set today Pt Will Perform Upper Body Bathing: with set-up;Sit to stand in shower;with supervision ADL Goal: Upper Body Bathing - Progress: Goal set today Pt Will Perform Lower Body Bathing: with supervision;with set-up;Sit to stand in shower ADL Goal: Lower Body Bathing - Progress: Goal set today Pt Will Perform Lower Body Dressing: Independently;Sit to stand from chair;Sit to stand from bed ADL Goal: Lower Body Dressing - Progress: Goal set today Pt Will Transfer to Toilet: with modified independence;with DME;Ambulation;3-in-1 ADL Goal: Toilet Transfer - Progress: Goal set today Pt Will Perform Toileting - Clothing Manipulation: Independently;Standing ADL Goal: Toileting - Clothing Manipulation - Progress: Goal set today Pt Will Perform Toileting - Hygiene: Independently;Sitting on 3-in-1 or toilet;Sit to stand from 3-in-1/toilet ADL Goal: Toileting - Hygiene - Progress: Goal set today Pt Will Perform Tub/Shower Transfer: Tub transfer;with supervision;Ambulation;with DME ADL Goal: Tub/Shower Transfer - Progress: Goal set today  OT Evaluation Precautions/Restrictions  Precautions Precautions: Knee;Fall Required Braces or Orthoses: No Restrictions Weight Bearing Restrictions: Yes LLE Weight Bearing: Weight bearing as tolerated Prior Functioning Home Living Lives With: Spouse Receives Help From:  Family Type of Home:  House Home Layout: One level Home Access: Stairs to enter Entrance Stairs-Rails: None Entrance Stairs-Number of Steps: 1 Bathroom Shower/Tub: Forensic scientist: Standard Home Adaptive Equipment: Bedside commode/3-in-1;Shower chair without back Additional Comments: Pt is going to stay at her mother's house so as to be able to have necessary level of assist/supervision upon d/c Prior Function Level of Independence: Independent with homemaking with ambulation;Independent with gait;Independent with transfers ADL ADL Eating/Feeding: Simulated;Independent Where Assessed - Eating/Feeding: Chair Grooming: Simulated;Minimal assistance Where Assessed - Grooming: Standing at sink Upper Body Bathing: Simulated;Set up;Supervision/safety Where Assessed - Upper Body Bathing: Sitting, bed Lower Body Bathing: Simulated;Minimal assistance Where Assessed - Lower Body Bathing: Sit to stand from bed Upper Body Dressing: Simulated;Supervision/safety;Set up Where Assessed - Upper Body Dressing: Sitting, bed Lower Body Dressing: Simulated;Minimal assistance Where Assessed - Lower Body Dressing: Sit to stand from bed Toilet Transfer: Simulated;Minimal assistance Toilet Transfer Details (indicate cue type and reason): Min A to turn RW with turns Toilet Transfer Method: Ambulating Toileting - Clothing Manipulation: Simulated;Minimal assistance Where Assessed - Glass blower/designer Manipulation: Standing Toileting - Hygiene: Simulated;Minimal assistance Where Assessed - Toileting Hygiene:  (sit to stand from bed) Tub/Shower Transfer: Not assessed Equipment Used: Rolling walker Ambulation Related to ADLs: Min A with RW ambulation. Assist to turn  ADL Comments: Doing very well. Expect great progress Vision/Perception  Vision - History Baseline Vision: Wears glasses only for reading Patient Visual Report: No change from baseline Cognition Cognition Arousal/Alertness:  Awake/alert Overall Cognitive Status: Appears within functional limits for tasks assessed Orientation Level: Oriented X4 Sensation/Coordination Sensation Light Touch: Appears Intact Coordination Gross Motor Movements are Fluid and Coordinated: Yes Fine Motor Movements are Fluid and Coordinated: Yes Extremity Assessment RUE Assessment RUE Assessment: Within Functional Limits LUE Assessment LUE Assessment: Within Functional Limits Mobility  Bed Mobility Bed Mobility: Yes Supine to Sit: 5: Supervision;HOB flat Supine to Sit Details (indicate cue type and reason): vc's for technique;  Sitting - Scoot to Edge of Bed: 5: Supervision Transfers Sit to Stand: 5: Supervision Sit to Stand Details (indicate cue type and reason): cues for hand placement/safety/leg out Stand to Sit: 5: Supervision Exercises Total Joint Exercises Ankle Circles/Pumps: AROM;20 reps;Seated Heel Slides: AAROM;Left;10 reps;Supine Long Arc Quad: AROM;10 reps;Seated;Other (comment) (to approx.20 degrees terminal extension) End of Session OT - End of Session Activity Tolerance: Patient tolerated treatment well Patient left: in chair;with call bell in reach;with family/visitor present Nurse Communication: Mobility status for transfers;Mobility status for ambulation General Behavior During Session: [facility] for tasks performed Cognition: [facility] for tasks performed   Erle Guster 05/11/2011, 12:31 PM

## 2011-05-11 NOTE — Progress Notes (Signed)
Utilization review completed. Riana Tessmer, RN, BSN. 05/11/11  

## 2011-05-11 NOTE — Progress Notes (Signed)
Physical Therapy Treatment Patient Details Name: Sierra Banks MRN: [id number] DOB: Jun 01, 1950 Today's Date: 05/11/2011  PT Assessment/Plan  PT - Assessment/Plan Comments on Treatment Session: mobility at min guard A to Supervision; knee flexion AAROM ~90 degrees Follow Up Recommendations: Home health PT Equipment Recommended: Rolling walker with 5" wheels PT Goals  Acute Rehab PT Goals PT Goal: Supine/Side to Sit - Progress: Progressing toward goal PT Transfer Goal: Bed to Chair/Chair to Bed - Progress: Progressing toward goal PT Goal: Ambulate - Progress: Progressing toward goal PT Goal: Up/Down Stairs - Progress: Goal set today PT Goal: Perform Home Exercise Program - Progress: Progressing toward goal  PT Treatment Precautions/Restrictions  Precautions Precautions: Knee;Fall Required Braces or Orthoses: No Restrictions Weight Bearing Restrictions: Yes LLE Weight Bearing: Weight bearing as tolerated Mobility (including Balance) Bed Mobility Bed Mobility: Yes Supine to Sit: 5: Supervision;HOB flat Supine to Sit Details (indicate cue type and reason): vc's for technique;  Sitting - Scoot to Edge of Bed: 5: Supervision Transfers Transfers: Yes Sit to Stand: 5: Supervision Sit to Stand Details (indicate cue type and reason): cues for hand placement/safety/leg out Stand to Sit: 5: Supervision Ambulation/Gait Ambulation/Gait: Yes Ambulation/Gait Assistance: 4: Min assist Ambulation/Gait Assistance Details (indicate cue type and reason): vc's for RW safety,  help maneurvering the RW, otherwise no assist Ambulation Distance (Feet): 35 Feet Assistive device: Rolling walker Gait Pattern: Step-to pattern (steady, variable step lengths) Stairs: No Wheelchair Mobility Wheelchair Mobility: No  Posture/Postural Control Posture/Postural Control: No significant limitations Exercise  Total Joint Exercises Ankle Circles/Pumps: AROM;20 reps;Seated Heel Slides: AAROM;Left;10  reps;Supine Long Arc Quad: AROM;10 reps;Seated;Other (comment) (to approx.20 degrees terminal extension) End of Session PT - End of Session Activity Tolerance: Patient tolerated treatment well Patient left: in chair;with call bell in reach Nurse Communication: Mobility status for transfers;Mobility status for ambulation General Behavior During Session: [facility] for tasks performed Cognition: [facility] for tasks performed  Barbra Miner, Eliseo Gum 05/11/2011, 12:35 PM  05/11/2011  Rafael Gonzalez Bing, PT [phone] (pager)

## 2011-05-11 NOTE — Progress Notes (Signed)
Physical Therapy Treatment Patient Details Name: Sierra Banks MRN: [id number] DOB: 1951/01/26 Today's Date: 05/11/2011  PT Assessment/Plan  PT - Assessment/Plan Comments on Treatment Session:  able to SLR actively, Mobility at supervision or better. PT Plan: Discharge plan remains appropriate Follow Up Recommendations: Home health PT Equipment Recommended: Rolling walker with 5" wheels PT Goals  Acute Rehab PT Goals PT Goal: Supine/Side to Sit - Progress: Progressing toward goal PT Transfer Goal: Bed to Chair/Chair to Bed - Progress: Progressing toward goal PT Goal: Ambulate - Progress: Progressing toward goal PT Goal: Perform Home Exercise Program - Progress: Progressing toward goal  PT Treatment Precautions/Restrictions  Precautions Precautions: Knee;Fall Required Braces or Orthoses: No Restrictions Weight Bearing Restrictions: Yes LLE Weight Bearing: Weight bearing as tolerated Mobility (including Balance) Bed Mobility Bed Mobility: Yes Supine to Sit: 5: Supervision;HOB flat Transfers Transfers: Yes Sit to Stand: 5: Supervision Sit to Stand Details (indicate cue type and reason): vc's to reinforce transfer safety Stand to Sit: 5: Supervision Ambulation/Gait Ambulation/Gait Assistance: 5: Supervision Ambulation Distance (Feet): 50 Feet Assistive device: Rolling walker Gait Pattern: Step-to pattern  Posture/Postural Control Posture/Postural Control: No significant limitations Exercise  Total Joint Exercises Ankle Circles/Pumps: AROM;20 reps Quad Sets: AROM;Both;10 reps;Supine Short Arc Quad: AROM;Left;10 reps;Supine Heel Slides: AAROM;Left;15 reps;Supine Hip ABduction/ADduction: AROM;Left;15 reps;Supine Straight Leg Raises: AROM;Both;10 reps;Supine Long Arc Quad: AROM;Left;10 reps;Seated Knee Flexion: AROM;10 reps;Seated (to 105 degrees) End of Session PT - End of Session Activity Tolerance: Patient tolerated treatment well Patient left: in bed;with call  bell in reach Nurse Communication: Mobility status for transfers;Mobility status for ambulation General Behavior During Session: [facility] for tasks performed Cognition: [facility] for tasks performed  Ronne Savoia, Eliseo Gum 05/11/2011, 5:36 PM  05/11/2011  Sherrill Bing, PT [phone] (pager)

## 2011-05-11 NOTE — Progress Notes (Signed)
  Echocardiogram 2D Echocardiogram has been performed.  Sierra Banks L 05/11/2011, 10:57 AM

## 2011-05-11 NOTE — Progress Notes (Signed)
[facility]  Interim history: Patient is a 61 year old female with history of hypertension, hyperlipidemia, on Lasix for peripheral edema (patient states, no history of CHF) currently admitted under orthopedic service, underwent left total knee arthroplasty on 05/08/2011. Postoperative period was uneventful up until the patient started having shortness of breath on 3/26. A CXR reveals b/l infiltrates which are related to either Pneumonia or pulmonary edema.    Subjective: She feels that her breathing is somewhat better today. She has a cough and was treated for a bronchitis about 1 month ago with a Z pak. However, she feels that the cough did not completely go away. She does take lasix at home for pedal edema and has never had an ECHO to look for CHF.   Objective: Blood pressure 131/45, pulse 74, temperature 97.9 F (36.6 C), temperature source Oral, resp. rate 16, height 5\' 4"  (1.626 m), weight 92.987 kg (205 lb), SpO2 95.00%. Weight change:   Intake/Output Summary (Last 24 hours) at 05/11/11 1645 Last data filed at 05/11/11 1400  Gross per 24 hour  Intake   1159 ml  Output   3050 ml  Net  -1891 ml    Physical Exam: General: Alert and awake, oriented x3, at the time of my examination, off NRB mask, eating lunch, talking in full sentences  HEENT: anicteric sclera, pupils reactive to light and accommodation, EOMI  CVS: S1-S2 clear, no murmur rubs or gallops  Chest: mild bibasilar crackles  Abdomen: soft nontender, nondistended, normal bowel sounds, no organomegaly  Extremities: no cyanosis, clubbing or edema noted bilaterally  Neuro: Cranial nerves II-XII intact, no focal neurological deficits    Lab Results:  Basename 05/11/11 0603 05/10/11 0540  NA 137 137  K 4.0 4.8  CL 100 101  CO2 29 30  GLUCOSE 180* 110*  BUN 23 22  CREATININE 1.13* 1.16*  CALCIUM 9.4 9.1  MG -- --  PHOS -- --   No results found for this basename:  AST:2,ALT:2,ALKPHOS:2,BILITOT:2,PROT:2,ALBUMIN:2 in the last 72 hours No results found for this basename: LIPASE:2,AMYLASE:2 in the last 72 hours  Basename 05/11/11 0603 05/10/11 0540  WBC 8.4 10.8*  NEUTROABS -- --  HGB 8.8* 9.9*  HCT 27.0* 30.0*  MCV 88.5 88.5  PLT 190 203   No results found for this basename: CKTOTAL:3,CKMB:3,CKMBINDEX:3,TROPONINI:3 in the last 72 hours No components found with this basename: POCBNP:3 No results found for this basename: DDIMER:2 in the last 72 hours No results found for this basename: HGBA1C:2 in the last 72 hours No results found for this basename: CHOL:2,HDL:2,LDLCALC:2,TRIG:2,CHOLHDL:2,LDLDIRECT:2 in the last 72 hours No results found for this basename: TSH,T4TOTAL,FREET3,T3FREE,THYROIDAB in the last 72 hours No results found for this basename: VITAMINB12:2,FOLATE:2,FERRITIN:2,TIBC:2,IRON:2,RETICCTPCT:2 in the last 72 hours  Micro Results: Recent Results (from the past 240 hour(s))  CULTURE, BLOOD (ROUTINE X 2)     Status: Normal (Preliminary result)   Collection Time   05/10/11 11:02 AM      Component Value Range Status Comment   Specimen Description BLOOD RIGHT ARM   Final    Special Requests BOTTLES DRAWN AEROBIC AND ANAEROBIC 10CC   Final    Culture  Setup Time [id number]   Final    Culture     Final    Value:        BLOOD CULTURE RECEIVED NO GROWTH TO DATE CULTURE WILL BE HELD FOR 5 DAYS BEFORE ISSUING A FINAL NEGATIVE REPORT   Report Status PENDING   Incomplete   CULTURE, BLOOD (ROUTINE X  2)     Status: Normal (Preliminary result)   Collection Time   05/10/11 11:06 AM      Component Value Range Status Comment   Specimen Description BLOOD RIGHT ARM   Final    Special Requests BOTTLES DRAWN AEROBIC AND ANAEROBIC 10CC   Final    Culture  Setup Time [id number]   Final    Culture     Final    Value:        BLOOD CULTURE RECEIVED NO GROWTH TO DATE CULTURE WILL BE HELD FOR 5 DAYS BEFORE ISSUING A FINAL NEGATIVE REPORT   Report Status  PENDING   Incomplete     Studies/Results: Dg Chest 2 View  05/09/2011  *RADIOLOGY REPORT*  Clinical Data: SOB, wheezing  CHEST - 2 VIEW  Comparison: 04/27/2011  Findings: Low lung volumes.  Patchy right perihilar/mid lung opacity, suspicious for pneumonia.  Additional bibasilar opacities, possibly atelectasis.  No pleural effusion or pneumothorax.  Cardiomediastinal silhouette is within normal limits.  Degenerative changes of the visualized thoracolumbar spine.  IMPRESSION: Patchy right perihilar/midlung opacity, suspicious for pneumonia.  Original Report Authenticated By: Charline Bills, M.D.   Dg Chest 2 View  04/27/2011  *RADIOLOGY REPORT*  Clinical Data: Preoperative evaluation for left total knee arthroplasty.  Hypertension, gastroesophageal reflux disease, dysrhythmia and dry cough.  Nonsmoker  CHEST - 2 VIEW  Comparison: 05/28/2006  Findings: Lung volumes are slightly diminished. Taking this into consideration, heart size is mildly enlarged and stable. A stable mediastinal contour is seen.  The lung fields appear clear with no signs of focal infiltrate or congestive failure.  No pleural fluid or significant peribronchial cuffing is seen.  No pleural fluid is identified.  Bony structures appear intact.  IMPRESSION: Stable mild cardiac enlargement.  No new worrisome focal or acute cardiopulmonary abnormality noted.  Original Report Authenticated By: Bertha Stakes, M.D.   Ct Angio Chest W/cm &/or Wo Cm  05/10/2011  *RADIOLOGY REPORT*  Clinical Data: New onset shortness of breath and fluid retention after recent knee surgery.  CT ANGIOGRAPHY CHEST  Technique:  Multidetector CT imaging of the chest using the standard protocol during bolus administration of intravenous contrast. Multiplanar reconstructed images including MIPs were obtained and reviewed to evaluate the vascular anatomy.  Contrast: OMNIPAQUE IOHEXOL 350 MG/ML IV SOLN  Comparison: Chest radiograph 05/08/1937.  Findings:  Evaluation for segmental and subsegmental pulmonary emboli is limited in the lower lobes due to suboptimal opacification and severe respiratory motion.  Otherwise, no pulmonary embolus.  Mediastinal lymph nodes measure up to 1.5 cm in the subcarinal station.  Bihilar lymphoid tissue.  No axillary adenopathy.  Heart is mildly enlarged.  No pericardial effusion.  Again, image quality is degraded by respiratory motion, limiting evaluation of the lungs.  Added parenchymal density bilaterally is indicative of expiratory phase imaging.  However, there is air space consolidation in both lower lobes, left greater than right. No pleural fluid.  Airway is otherwise unremarkable.  Incidental imaging of the upper abdomen shows low attenuation throughout the visualized portion of the liver, with sparing along the gallbladder fossa.  Upper abdominal lymph nodes are not enlarged by CT size criteria.  No worrisome lytic or sclerotic lesions.  IMPRESSION:  1.  Evaluation for segmental and subsegmental pulmonary emboli in the lower lobes is precluded by severe respiratory motion and suboptimal opacification.  Otherwise, no pulmonary embolus. 2.  Bilateral lower lobe air space consolidation, left greater than right, indicative of pneumonia. 3.  Hepatic steatosis.  Original Report Authenticated By: Reyes Ivan, M.D.    Medications: Scheduled Meds:    . amLODipine  10 mg Oral Daily  . antiseptic oral rinse  15 mL Mouth Rinse BID  . antiseptic oral rinse  15 mL Mouth Rinse q12n4p  . atenolol  100 mg Oral Daily  . buPROPion  75 mg Oral Daily  . celecoxib  200 mg Oral BID  . chlorhexidine  15 mL Mouth Rinse BID  . docusate sodium  100 mg Oral BID  . enoxaparin  30 mg Subcutaneous Q12H  . furosemide  40 mg Intravenous Q12H  . lisinopril  80 mg Oral Daily  . piperacillin-tazobactam (ZOSYN)  IV  3.375 g Intravenous Q8H  . predniSONE  60 mg Oral Q breakfast  . simvastatin  40 mg Oral QHS  . vancomycin  1,250 mg  Intravenous Q12H  . venlafaxine  300 mg Oral QHS  . DISCONTD: albuterol  2.5 mg Nebulization Q4H  . DISCONTD: ipratropium  0.5 mg Nebulization Q4H   Continuous Infusions:    . bupivacaine ON-Q pain pump     PRN Meds:.acetaminophen, acetaminophen, albuterol, alum & mag hydroxide-simeth, bisacodyl, diphenhydrAMINE, HYDROmorphone, menthol-cetylpyridinium, methocarbamol (ROBAXIN) IV, methocarbamol, metoCLOPramide (REGLAN) injection, metoCLOPramide, ondansetron (ZOFRAN) IV, ondansetron, oxyCODONE, phenol, senna-docusate, zolpidem  Assessment/Plan: Active Problems:  Acute respiratory failure with hypoxia B/l infiltrates. Difficult to tell if this is Pneumonia vs CHF but she has improved quickly. I will repeat a CXR tomorrow. We are still awaiting an ECHO read but will need to continue Lasix for now. Cont Vanc and Zosyn as well. Sputum culture will be ordered.    Pedal edema F/u on ECHO, cont Lasix.    Depression Cont Wellbutrin   HTN (hypertension) Stable on current meds.   Left knee surgery Per ortho   LOS: 3 days   [facility] [phone] 05/11/2011, 4:45 PM

## 2011-05-11 NOTE — Discharge Instructions (Signed)
Diet: As you were doing prior to hospitalization   Activity:  Increase activity slowly as tolerated                  No lifting or driving for 6 weeks  Shower:  May shower without a dressing once there is no drainage from your wound.                 Do NOT wash over the wound.  If drainage remains, cover wound with saran                  Wrap and then shower.  Clean incision with betadine and change dressing                        After saran wrap removed.  Dressing:  You may change your dressing on the first day home                    Then change the dressing daily with sterile 4"x4"s gauze dressing                     And TED hose for knees.  Use paper tape to hold dressing in place                     For hips.  You may clean the incision with alcohol prior to redressing.  Weight Bearing:  Weight bearing as tolerated as taught in physical therapy.  Use a                                walker or Crutches as instructed.  To prevent constipation: you may use a stool softener such as -               Colace ( over the counter) 100 mg by mouth twice a day                Drink plenty of fluids ( prune juice may be helpful) and high fiber foods                Miralax ( over the counter) for constipation as needed.    Precautions:  If you experience chest pain or shortness of breath - call 911 immediately               For transfer to the hospital emergency department!!               If you develop a fever greater that 101 F, purulent drainage from wound,                             increased redness or drainage from wound, or calf pain -- Call the office at                                                 [phone].  Follow- Up Appointment:  Please call for an appointment to be seen on 05/23/11  [place] - [phone]                   

## 2011-05-12 ENCOUNTER — Inpatient Hospital Stay (HOSPITAL_COMMUNITY): Payer: 59

## 2011-05-12 LAB — BASIC METABOLIC PANEL
BUN: 22 mg/dL (ref 6–23)
CO2: 33 mEq/L — ABNORMAL HIGH (ref 19–32)
Calcium: 9.2 mg/dL (ref 8.4–10.5)
Chloride: 104 mEq/L (ref 96–112)
Creatinine, Ser: 1.07 mg/dL (ref 0.50–1.10)
GFR calc Af Amer: 64 mL/min — ABNORMAL LOW (ref >90)
GFR calc non Af Amer: 55 mL/min — ABNORMAL LOW (ref >90)
Glucose, Bld: 113 mg/dL — ABNORMAL HIGH (ref 70–99)
Potassium: 3.7 mEq/L (ref 3.5–5.1)
Sodium: 144 mEq/L (ref 135–145)

## 2011-05-12 NOTE — Progress Notes (Signed)
ANTIBIOTIC CONSULT NOTE - FOLLOW UP  Pharmacy Consult for Vancomycin Indication: pneumonia  No Known Allergies  Vital Signs: Temp: 97.8 F (36.6 C) (03/29 0725) Temp src: Oral (03/29 0725) BP: 126/65 mmHg (03/29 0725) Pulse Rate: 70  (03/29 0725) Intake/Output from previous day: 03/28 0701 - 03/29 0700 In: 1138 [P.O.:480; IV Piggyback:658] Out: 3100 [Urine:3100] Intake/Output from this shift: Total I/O In: -  Out: 350 [Urine:350]  Labs:  [facility] 05/12/11 0540 05/11/11 0603 05/10/11 0540  WBC -- 8.4 10.8*  HGB -- 8.8* 9.9*  PLT -- 190 203  LABCREA -- -- --  CREATININE 1.07 1.13* 1.16*   Estimated Creatinine Clearance: 61 ml/min (by C-G formula based on Cr of 1.07). No results found for this basename: VANCOTROUGH:2,VANCOPEAK:2,VANCORANDOM:2,GENTTROUGH:2,GENTPEAK:2,GENTRANDOM:2,TOBRATROUGH:2,TOBRAPEAK:2,TOBRARND:2,AMIKACINPEAK:2,AMIKACINTROU:2,AMIKACIN:2, in the last 72 hours   Microbiology: Recent Results (from the past 720 hour(s))  URINE CULTURE     Status: Normal   Collection Time   04/27/11 11:36 AM      Component Value Range Status Comment   Specimen Description URINE, CLEAN CATCH   Final    Special Requests NONE   Final    Culture  Setup Time [id number]   Final    Colony Count NO GROWTH   Final    Culture NO GROWTH   Final    Report Status 04/28/2011 FINAL   Final   SURGICAL PCR SCREEN     Status: Normal   Collection Time   04/27/11 11:37 AM      Component Value Range Status Comment   MRSA, PCR NEGATIVE  NEGATIVE  Final    Staphylococcus aureus NEGATIVE  NEGATIVE  Final   CULTURE, BLOOD (ROUTINE X 2)     Status: Normal (Preliminary result)   Collection Time   05/10/11 11:02 AM      Component Value Range Status Comment   Specimen Description BLOOD RIGHT ARM   Final    Special Requests BOTTLES DRAWN AEROBIC AND ANAEROBIC 10CC   Final    Culture  Setup Time [id number]   Final    Culture     Final    Value:        BLOOD CULTURE RECEIVED NO GROWTH TO DATE  CULTURE WILL BE HELD FOR 5 DAYS BEFORE ISSUING A FINAL NEGATIVE REPORT   Report Status PENDING   Incomplete   CULTURE, BLOOD (ROUTINE X 2)     Status: Normal (Preliminary result)   Collection Time   05/10/11 11:06 AM      Component Value Range Status Comment   Specimen Description BLOOD RIGHT ARM   Final    Special Requests BOTTLES DRAWN AEROBIC AND ANAEROBIC 10CC   Final    Culture  Setup Time [id number]   Final    Culture     Final    Value:        BLOOD CULTURE RECEIVED NO GROWTH TO DATE CULTURE WILL BE HELD FOR 5 DAYS BEFORE ISSUING A FINAL NEGATIVE REPORT   Report Status PENDING   Incomplete     Anti-infectives     Start     Dose/Rate Route Frequency Ordered Stop   05/10/11 1400  piperacillin-tazobactam (ZOSYN) IVPB 3.375 g       3.375 g 12.5 mL/hr over 240 Minutes Intravenous 3 times per day 05/10/11 1101     05/10/11 1200   vancomycin (VANCOCIN) 1,250 mg in sodium chloride 0.9 % 250 mL IVPB        1,250 mg 166.7 mL/hr over 90 Minutes Intravenous Every  12 hours 05/10/11 1101     05/08/11 1600   ceFAZolin (ANCEF) IVPB 2 g/50 mL premix        2 g 100 mL/hr over 30 Minutes Intravenous Every 6 hours 05/08/11 1438 05/09/11 0453   05/07/11 1001   ceFAZolin (ANCEF) IVPB 2 g/50 mL premix        2 g 100 mL/hr over 30 Minutes Intravenous 60 min pre-op 05/07/11 1001 05/08/11 0858          Assessment: 61 year old female s/p L knee arthroplasty on 05/08/11, complicated by shortness of breath and CXR concern for pneumonia but also possibility of CHF. Wbc trending down and is now within normal limits. Scr is stable. Currently on day#3 vancomycin and zosyn.    Sputum-pending Blood cx x2 3/27-ngtd  Goal of Therapy:  Vancomycin trough level 15-20 mcg/ml  Plan:  Continue vancomycin 1250 q 12 hours - check trough 1-2 days if therapy is to continue >5 days. Continue zosyn 3.375g q8 hours Follow culture data  Sierra Banks 05/12/2011,10:43 AM

## 2011-05-12 NOTE — Progress Notes (Signed)
CARE MANAGEMENT NOTE 05/12/2011  Patient:  Sierra Banks, Sierra Banks   Account Number:  [id number]  Date Initiated:  05/12/2011  Documentation initiated by:  Vance Peper  Subjective/Objective Assessment:   61 yr old female s/p left total knee arthroplasty     Action/Plan:   Patient preoperatively setup with [facility], no changes. HAs DME   Anticipated DC Date:  05/12/2011   Anticipated DC Plan:  HOME W HOME HEALTH SERVICES      DC Planning Services  CM consult      [facility] Choice  HOME HEALTH   Choice offered to / List presented to:          [facility] arranged  HH-2 PT      [facility] agency  [facility]   Status of service:  Completed, signed off  Discharge Disposition:  HOME W HOME HEALTH SERVICES

## 2011-05-12 NOTE — Progress Notes (Signed)
[facility]  Interim history: Patient is a 61 year old female with history of hypertension, hyperlipidemia, on Lasix for peripheral edema (patient states, no history of CHF) currently admitted under orthopedic service, underwent left total knee arthroplasty on 05/08/2011. Postoperative period was uneventful up until the patient started having shortness of breath on 3/26. A CXR reveals b/l infiltrates which are related to either Pneumonia or pulmonary edema.    Subjective: Breathing is back to normal. No cough. Agrees with going home today.   Objective: Blood pressure 107/55, pulse 68, temperature 97.8 F (36.6 C), temperature source Oral, resp. rate 16, height 5\' 4"  (1.626 m), weight 92.987 kg (205 lb), SpO2 95.00%. Weight change:   Intake/Output Summary (Last 24 hours) at 05/12/11 1151 Last data filed at 05/12/11 1000  Gross per 24 hour  Intake    884 ml  Output   3450 ml  Net  -2566 ml    Physical Exam: General: Alert and awake, oriented x3, at the time of my examination, off NRB mask, eating lunch, talking in full sentences  HEENT: anicteric sclera, pupils reactive to light and accommodation, EOMI  CVS: S1-S2 clear, no murmur rubs or gallops  Chest: CTA Abdomen: soft nontender, nondistended, normal bowel sounds, no organomegaly  Extremities: no cyanosis, clubbing or edema noted bilaterally  Neuro: Cranial nerves II-XII intact, no focal neurological deficits    Lab Results:  Basename 05/12/11 0540 05/11/11 0603  NA 144 137  K 3.7 4.0  CL 104 100  CO2 33* 29  GLUCOSE 113* 180*  BUN 22 23  CREATININE 1.07 1.13*  CALCIUM 9.2 9.4  MG -- --  PHOS -- --   No results found for this basename: AST:2,ALT:2,ALKPHOS:2,BILITOT:2,PROT:2,ALBUMIN:2 in the last 72 hours No results found for this basename: LIPASE:2,AMYLASE:2 in the last 72 hours  Basename 05/11/11 0603 05/10/11 0540  WBC 8.4 10.8*  NEUTROABS -- --  HGB 8.8* 9.9*  HCT 27.0* 30.0*  MCV 88.5 88.5  PLT 190  203   No results found for this basename: CKTOTAL:3,CKMB:3,CKMBINDEX:3,TROPONINI:3 in the last 72 hours No components found with this basename: POCBNP:3 No results found for this basename: DDIMER:2 in the last 72 hours No results found for this basename: HGBA1C:2 in the last 72 hours No results found for this basename: CHOL:2,HDL:2,LDLCALC:2,TRIG:2,CHOLHDL:2,LDLDIRECT:2 in the last 72 hours No results found for this basename: TSH,T4TOTAL,FREET3,T3FREE,THYROIDAB in the last 72 hours No results found for this basename: VITAMINB12:2,FOLATE:2,FERRITIN:2,TIBC:2,IRON:2,RETICCTPCT:2 in the last 72 hours  Micro Results: Recent Results (from the past 240 hour(s))  CULTURE, BLOOD (ROUTINE X 2)     Status: Normal (Preliminary result)   Collection Time   05/10/11 11:02 AM      Component Value Range Status Comment   Specimen Description BLOOD RIGHT ARM   Final    Special Requests BOTTLES DRAWN AEROBIC AND ANAEROBIC 10CC   Final    Culture  Setup Time [id number]   Final    Culture     Final    Value:        BLOOD CULTURE RECEIVED NO GROWTH TO DATE CULTURE WILL BE HELD FOR 5 DAYS BEFORE ISSUING A FINAL NEGATIVE REPORT   Report Status PENDING   Incomplete   CULTURE, BLOOD (ROUTINE X 2)     Status: Normal (Preliminary result)   Collection Time   05/10/11 11:06 AM      Component Value Range Status Comment   Specimen Description BLOOD RIGHT ARM   Final    Special Requests BOTTLES DRAWN AEROBIC AND  ANAEROBIC 10CC   Final    Culture  Setup Time [id number]   Final    Culture     Final    Value:        BLOOD CULTURE RECEIVED NO GROWTH TO DATE CULTURE WILL BE HELD FOR 5 DAYS BEFORE ISSUING A FINAL NEGATIVE REPORT   Report Status PENDING   Incomplete     Studies/Results: Dg Chest 2 View  05/09/2011  *RADIOLOGY REPORT*  Clinical Data: SOB, wheezing  CHEST - 2 VIEW  Comparison: 04/27/2011  Findings: Low lung volumes.  Patchy right perihilar/mid lung opacity, suspicious for pneumonia.  Additional bibasilar  opacities, possibly atelectasis.  No pleural effusion or pneumothorax.  Cardiomediastinal silhouette is within normal limits.  Degenerative changes of the visualized thoracolumbar spine.  IMPRESSION: Patchy right perihilar/midlung opacity, suspicious for pneumonia.  Original Report Authenticated By: Charline Bills, M.D.   Dg Chest 2 View  04/27/2011  *RADIOLOGY REPORT*  Clinical Data: Preoperative evaluation for left total knee arthroplasty.  Hypertension, gastroesophageal reflux disease, dysrhythmia and dry cough.  Nonsmoker  CHEST - 2 VIEW  Comparison: 05/28/2006  Findings: Lung volumes are slightly diminished. Taking this into consideration, heart size is mildly enlarged and stable. A stable mediastinal contour is seen.  The lung fields appear clear with no signs of focal infiltrate or congestive failure.  No pleural fluid or significant peribronchial cuffing is seen.  No pleural fluid is identified.  Bony structures appear intact.  IMPRESSION: Stable mild cardiac enlargement.  No new worrisome focal or acute cardiopulmonary abnormality noted.  Original Report Authenticated By: Bertha Stakes, M.D.   Ct Angio Chest W/cm &/or Wo Cm  05/10/2011  *RADIOLOGY REPORT*  Clinical Data: New onset shortness of breath and fluid retention after recent knee surgery.  CT ANGIOGRAPHY CHEST  Technique:  Multidetector CT imaging of the chest using the standard protocol during bolus administration of intravenous contrast. Multiplanar reconstructed images including MIPs were obtained and reviewed to evaluate the vascular anatomy.  Contrast: OMNIPAQUE IOHEXOL 350 MG/ML IV SOLN  Comparison: Chest radiograph 05/08/1937.  Findings: Evaluation for segmental and subsegmental pulmonary emboli is limited in the lower lobes due to suboptimal opacification and severe respiratory motion.  Otherwise, no pulmonary embolus.  Mediastinal lymph nodes measure up to 1.5 cm in the subcarinal station.  Bihilar lymphoid tissue.  No  axillary adenopathy.  Heart is mildly enlarged.  No pericardial effusion.  Again, image quality is degraded by respiratory motion, limiting evaluation of the lungs.  Added parenchymal density bilaterally is indicative of expiratory phase imaging.  However, there is air space consolidation in both lower lobes, left greater than right. No pleural fluid.  Airway is otherwise unremarkable.  Incidental imaging of the upper abdomen shows low attenuation throughout the visualized portion of the liver, with sparing along the gallbladder fossa.  Upper abdominal lymph nodes are not enlarged by CT size criteria.  No worrisome lytic or sclerotic lesions.  IMPRESSION:  1.  Evaluation for segmental and subsegmental pulmonary emboli in the lower lobes is precluded by severe respiratory motion and suboptimal opacification.  Otherwise, no pulmonary embolus. 2.  Bilateral lower lobe air space consolidation, left greater than right, indicative of pneumonia. 3.  Hepatic steatosis.  Original Report Authenticated By: Reyes Ivan, M.D.    Medications: Scheduled Meds:    . antiseptic oral rinse  15 mL Mouth Rinse BID  . antiseptic oral rinse  15 mL Mouth Rinse q12n4p  . atenolol  100 mg Oral Daily  .  buPROPion  75 mg Oral Daily  . celecoxib  200 mg Oral BID  . chlorhexidine  15 mL Mouth Rinse BID  . docusate sodium  100 mg Oral BID  . enoxaparin  30 mg Subcutaneous Q12H  . furosemide  40 mg Intravenous Q12H  . lisinopril  80 mg Oral Daily  . simvastatin  40 mg Oral QHS  . venlafaxine  300 mg Oral QHS  . DISCONTD: amLODipine  10 mg Oral Daily  . DISCONTD: piperacillin-tazobactam (ZOSYN)  IV  3.375 g Intravenous Q8H  . DISCONTD: predniSONE  60 mg Oral Q breakfast  . DISCONTD: vancomycin  1,250 mg Intravenous Q12H   Continuous Infusions:    . bupivacaine ON-Q pain pump      ECHO: Study Conclusions  - Left ventricle: The cavity size was normal. Systolic function was normal. The estimated ejection  fraction was in the range of 55% to 60%. Wall motion was normal; there were no regional wall motion abnormalities. - Pulmonary arteries: Systolic pressure was mildly increased. PA peak pressure: 31mm Hg (S). - Pericardium, extracardiac: A small pericardial effusion was identified.   Assessment/Plan: Active Problems:  Acute respiratory failure with hypoxia B/l infiltrates. ECHO done but does not mention diastolic dysfunction. I am strongly suspicious that she has diastolic dysfunction>   repeat CXR has cleared quickley with diuretics. Will d/c antibiotics.  RECOMMENDATIONS: 1- obtain dry weight prior to discharge and tell patient 2. Cardiology consult as outpt with [place] 3. D/c Norvasc as it can cause pedal edema and per patient she noticed her pedal edema was worse after starting Norvasc.  4. Daily weights at home  5. Home health RN for education on Diastolic CHF 6. F/u with PCP in 1 week for BP and weight check.    Pedal edema See above- diastolic CHF   Depression Cont Wellbutrin   HTN (hypertension) BP low normal- likely due to narcotics. Will be holding Norvasc   Left knee surgery Per ortho   LOS: 4 days   [facility] [phone] 05/12/2011, 11:51 AM

## 2011-05-12 NOTE — Progress Notes (Addendum)
Physical Therapy Treatment Patient Details Name: Sierra Banks MRN: [id number] DOB: 1950/10/03 Today's Date: 05/12/2011  PT Assessment/Plan  PT - Assessment/Plan Comments on Treatment Session: ready for D/C from mobility standpoint PT Plan: Discharge plan remains appropriate Follow Up Recommendations: Home health PT Equipment Recommended: None  PT Goals  Acute Rehab PT Goals PT Goal Formulation: With patient PT Goal: Supine/Side to Sit - Progress: Met PT Transfer Goal: Bed to Chair/Chair to Bed - Progress: Met PT Goal: Ambulate - Progress: Met PT Goal: Perform Home Exercise Program - Progress: Met  PT Treatment Precautions/Restrictions  Precautions Precautions: Knee Required Braces or Orthoses: No Restrictions Weight Bearing Restrictions: Yes LLE Weight Bearing: Weight bearing as tolerated Mobility (including Balance) Bed Mobility Bed Mobility: Yes Supine to Sit: 7: Independent Sitting - Scoot to Edge of Bed: 7: Independent Transfers Transfers: Yes Sit to Stand: 6: Modified independent (Device/Increase time);From bed Sit to Stand Details (indicate cue type and reason): reinforced safety Stand to Sit: 7: Independent Ambulation/Gait Ambulation/Gait: Yes Ambulation/Gait Assistance: 6: Modified independent (Device/Increase time) Ambulation/Gait Assistance Details (indicate cue type and reason): steady step to gait; starting to progress to step through gait Ambulation Distance (Feet): 180 Feet Assistive device: Rolling walker Gait Pattern: Step-to pattern Stairs: No  Balance Balance Assessed: Yes Exercise  Total Joint Exercises Ankle Circles/Pumps: AROM;20 reps;Supine Quad Sets: AROM;15 reps;Supine Short Arc Quad: AROM;Left;10 reps;Supine Heel Slides: AROM;10 reps;Left;Supine Hip ABduction/ADduction: AROM;Left;10 reps;Supine Straight Leg Raises: AROM;Left;10 reps;Supine Knee Flexion: AROM;Left;5 reps;Supine (105 Degrees) End of Session PT - End of  Session Activity Tolerance: Patient tolerated treatment well Patient left: in chair;with call bell in reach Nurse Communication: Mobility status for transfers;Mobility status for ambulation General Behavior During Session: [facility] for tasks performed Cognition: [facility] for tasks performed  Rayford Williamsen, Eliseo Gum 05/12/2011, 10:40 AM  05/12/2011  Burleson Bing, PT [phone] (pager)

## 2011-05-12 NOTE — Progress Notes (Signed)
Pt given discharge instructions. Pt/husband verbalized understanding of all instructions and will comply. IVs removed. Pt taken via wheelchair to pt discharge area and assisted into vehicle.

## 2011-05-16 LAB — CULTURE, BLOOD (ROUTINE X 2)
Culture  Setup Time: 201303271704
Culture  Setup Time: 201303271704
Culture: NO GROWTH
Culture: NO GROWTH

## 2011-05-22 ENCOUNTER — Other Ambulatory Visit: Payer: Self-pay | Admitting: Family Medicine

## 2011-06-27 ENCOUNTER — Other Ambulatory Visit: Payer: Self-pay | Admitting: *Deleted

## 2011-06-27 NOTE — Telephone Encounter (Signed)
Insurance will not cover Effexor ER 150 3 tabs in the morning.  Prior Auth started and patient is aware.

## 2011-06-30 ENCOUNTER — Other Ambulatory Visit: Payer: Self-pay | Admitting: *Deleted

## 2011-06-30 ENCOUNTER — Telehealth: Payer: Self-pay | Admitting: Pulmonary Disease

## 2011-06-30 MED ORDER — VENLAFAXINE HCL ER 150 MG PO CP24: 150.0000 mg | ORAL_CAPSULE | Freq: Two times a day (BID) | ORAL | Status: DC

## 2011-06-30 NOTE — Telephone Encounter (Signed)
Patient is aware that insurance may not cover the cost of this refill.

## 2011-06-30 NOTE — Telephone Encounter (Signed)
Will speak to sn when he returns and call pt at that time

## 2011-07-03 NOTE — Telephone Encounter (Signed)
So sorry---[facility] is not taking new pts at this time---we can refer them to a primary care practice near them if they would like.  thanks

## 2011-07-03 NOTE — Telephone Encounter (Signed)
Called spoke with Mrs. Sierra Banks.  I advised of below per Dr. Kriste Basque.  States she will stay with Dr. Tawanna Cooler for now - no referral to another PCP needed at this time.

## 2011-08-26 ENCOUNTER — Other Ambulatory Visit: Payer: Self-pay | Admitting: Family Medicine

## 2011-09-12 ENCOUNTER — Encounter (HOSPITAL_COMMUNITY): Payer: Self-pay

## 2011-09-28 ENCOUNTER — Other Ambulatory Visit: Payer: Self-pay | Admitting: Family Medicine

## 2011-10-25 ENCOUNTER — Other Ambulatory Visit: Payer: Self-pay | Admitting: Family Medicine

## 2011-10-25 ENCOUNTER — Other Ambulatory Visit: Payer: Self-pay | Admitting: *Deleted

## 2011-10-25 MED ORDER — ATENOLOL 100 MG PO TABS: 100.0000 mg | ORAL_TABLET | Freq: Every day | ORAL | Status: DC

## 2011-11-22 ENCOUNTER — Ambulatory Visit (INDEPENDENT_AMBULATORY_CARE_PROVIDER_SITE_OTHER): Payer: 59 | Admitting: Internal Medicine

## 2011-11-22 ENCOUNTER — Telehealth: Payer: Self-pay | Admitting: Family Medicine

## 2011-11-22 ENCOUNTER — Encounter: Payer: Self-pay | Admitting: Internal Medicine

## 2011-11-22 VITALS — BP 186/102 | HR 60 | Temp 97.6°F | Wt 217.0 lb

## 2011-11-22 DIAGNOSIS — R7309 Other abnormal glucose: Secondary | ICD-10-CM

## 2011-11-22 DIAGNOSIS — I10 Essential (primary) hypertension: Secondary | ICD-10-CM

## 2011-11-22 NOTE — Assessment & Plan Note (Signed)
Chart review performed. Patient has history of hyperglycemia. Screen for diabetes. Obtain hemoglobin A1c.

## 2011-11-22 NOTE — Assessment & Plan Note (Signed)
61 year old white female with uncontrolled hypertension. This is likely secondary to medication noncompliance. She stopped amlodipine on her own 6 months ago. However patient has difficult to control hypertension. She's on 80 mg of lisinopril. Consider workup for secondary hypertension. She defers renal artery Doppler study for now. Patient instructed to restart amlodipine at 5 mg the next 2 or 3 days and then titrate to 10 mg within one week. Patient also advised to try to decrease her lisinopril to 40 mg. Continue to monitor blood pressure at home. Followup with her PCP in one month. Arrange basic metabolic panel before next office visit.

## 2011-11-22 NOTE — Progress Notes (Signed)
Subjective:    Patient ID: Sierra Banks, female    DOB: [birth year], 61 y.o.   MRN: [id number]  HPI  61 year old white female with history of hypertension, obesity and hyperlipidemia complains of elevated blood pressure readings. Medication reconciliation performed. Patient reports she has not taking amlodipine for the last 6 months. She was not aware of what the medication was for and stopped taking it on her own. Over last 1-2 weeks patient has started new diet program and she started checking her blood pressure at home. She has noticed elevated readings with systolic blood pressures in the 170s to 180s. She denies any chest pain but has mild headache.  Patient reports her blood pressure has been not easy to control. She is currently taking 80 mg of lisinopril, furosemide 40 mg and atenolol 100 mg daily.  Review of Systems No chest pain.   History of snoring  Past Medical History  Diagnosis Date  . 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 HYPERLIPIDEMIA 12/23/2007  . Overweight 01/12/2009  . HYPERTENSION 08/28/2006  . DEPRESSION 08/28/2006  . ABSCESS, BREAST, RIGHT 10/04/2009  . No pertinent past medical history     BRONCHITIS 3 WEEKS  COUGH   . GERD (gastroesophageal reflux disease)     OCC TUMS   . Dysrhythmia     HEART PALPITATIONS   . Hepatitis     JAUNDICE AGE 66    History   Social History  . Marital Status: Married    Spouse Name: N/A    Number of Children: N/A  . Years of Education: N/A   Occupational History  . Not on file.   Social History Main Topics  . Smoking status: Never Smoker   . Smokeless tobacco: Not on file  . Alcohol Use: No  . Drug Use: No  . Sexually Active:    Other Topics Concern  . Not on file   Social History Narrative  . No narrative on file    Past Surgical History  Procedure Date  . Abdominal hysterectomy   . Tonsillectomy     1969  . Tubal ligation     1980  . Carpal tunnel release     LEFT  2002  . Dilation and curettage of uterus     2011    . 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  No Known Allergies  Current Outpatient Prescriptions on File Prior to Visit  Medication Sig Dispense Refill  . atenolol (TENORMIN) 100 MG tablet Take 1 tablet (100 mg total) by mouth daily.  100 tablet  2  . buPROPion (WELLBUTRIN) 75 MG tablet TAKE 1 TABLET AT BEDTIME  90 tablet  3  . furosemide (LASIX) 40 MG tablet TAKE 1 TABLET EVERY MORNING  100 tablet  2  . Multiple Vitamin (MULITIVITAMIN WITH MINERALS) TABS Take 1 tablet by mouth daily.      . simvastatin (ZOCOR) 40 MG tablet Take 40 mg by mouth at bedtime.      Marland Kitchen venlafaxine XR (EFFEXOR-XR) 150 MG 24 hr capsule Take 1 capsule (150 mg total) by mouth 2 (two) times daily.  180 capsule  1  . amLODipine (NORVASC) 10 MG tablet Take 10 mg by mouth daily.      Marland Kitchen DISCONTD: atenolol (TENORMIN) 100 MG tablet TAKE 1 TABLET EVERY MORNING  100 tablet  1  .  DISCONTD: buPROPion (WELLBUTRIN) 75 MG tablet Take 75 mg by mouth 1 day or 1 dose.      Marland Kitchen DISCONTD: furosemide (LASIX) 40 MG tablet Take 40 mg by mouth daily.        BP 186/102  Pulse 60  Temp 97.6 F (36.4 C) (Oral)  Wt 217 lb (98.431 kg)       Objective:   Physical Exam  Constitutional: She is oriented to person, place, and time.       Pleasant, obese 61 y/o female  HENT:  Head: Normocephalic and atraumatic.  Cardiovascular: Normal rate, regular rhythm and normal heart sounds.   No murmur heard. Pulmonary/Chest: Effort normal and breath sounds normal. She has no wheezes.  Musculoskeletal: She exhibits no edema.  Neurological: She is alert and oriented to person, place, and time.  Psychiatric: She has a normal mood and affect. Her behavior is normal.          Assessment & Plan:

## 2011-11-22 NOTE — Telephone Encounter (Signed)
Caller: Amaira/Patient; Patient Name: Sierra Banks; PCP: Kelle Darting [facility]); Best Callback Phone Number: [phone]; Reason for call: Onset of concern was today was noted today when was monitoring blood pressure  yesterday morning 199/99; this morning 10/09--192/96 and repeated at 210/98 take at about 13:30.  Denies all symptoms except for dull headache- rates it as a 3/10 pain scale.  Patient thinks that it is accurate since the blood pressure device measured husband as the level it usually runs.  She usually has elevated readings and she has not be taking them.  Last documented reading in 2/13 was 120/80 which she states is rare.  Has taken medications today as directed by her provider and as previously directed by him- has in  taken an extra Atenolol about at 13:00.  LMP was five years ago. Triaged using  Hypertension, Diagnosed or Suspected with a disposition to be seen within 4 hours.  Appointment scheduled at 16:00 with Dr. Artist Pais 11/22/11.  Care advice given with patient demonstrating understanding.

## 2011-11-22 NOTE — Patient Instructions (Addendum)
Continue to monitor your blood pressure at home.  Call our office if you have persistent elevated readings. Please complete the following lab tests before your next follow up appointment: BMET - 401.9 A1c - 790.29

## 2011-12-24 ENCOUNTER — Other Ambulatory Visit: Payer: Self-pay | Admitting: Family Medicine

## 2012-01-15 ENCOUNTER — Other Ambulatory Visit: Payer: Self-pay | Admitting: Family Medicine

## 2012-04-11 ENCOUNTER — Other Ambulatory Visit: Payer: Self-pay | Admitting: Family Medicine

## 2012-04-13 ENCOUNTER — Other Ambulatory Visit: Payer: Self-pay | Admitting: Family Medicine

## 2012-04-20 ENCOUNTER — Other Ambulatory Visit: Payer: Self-pay | Admitting: Family Medicine

## 2012-06-18 ENCOUNTER — Other Ambulatory Visit: Payer: Self-pay | Admitting: Family Medicine

## 2012-09-20 ENCOUNTER — Other Ambulatory Visit: Payer: Self-pay | Admitting: Family Medicine

## 2012-10-13 ENCOUNTER — Other Ambulatory Visit: Payer: Self-pay | Admitting: Family Medicine

## 2012-10-17 ENCOUNTER — Other Ambulatory Visit: Payer: Self-pay | Admitting: Family Medicine

## 2013-01-10 ENCOUNTER — Other Ambulatory Visit: Payer: Self-pay | Admitting: Family Medicine

## 2013-01-11 ENCOUNTER — Other Ambulatory Visit: Payer: Self-pay | Admitting: Family Medicine

## 2013-01-17 ENCOUNTER — Other Ambulatory Visit: Payer: Self-pay | Admitting: Family Medicine

## 2013-03-08 ENCOUNTER — Other Ambulatory Visit: Payer: Self-pay | Admitting: Family Medicine

## 2013-03-21 ENCOUNTER — Other Ambulatory Visit: Payer: Self-pay | Admitting: Family Medicine

## 2013-03-25 ENCOUNTER — Other Ambulatory Visit: Payer: Self-pay | Admitting: Family Medicine

## 2013-04-07 ENCOUNTER — Other Ambulatory Visit: Payer: Self-pay | Admitting: Family Medicine

## 2013-04-14 ENCOUNTER — Other Ambulatory Visit: Payer: Self-pay | Admitting: Family Medicine

## 2013-08-24 ENCOUNTER — Encounter (HOSPITAL_COMMUNITY): Payer: Self-pay | Admitting: Emergency Medicine

## 2013-08-24 ENCOUNTER — Emergency Department (HOSPITAL_COMMUNITY)
Admission: EM | Admit: 2013-08-24 | Discharge: 2013-08-24 | Disposition: A | Discharge status: Home / Self Care | Payer: 59 | Source: Home / Self Care | Attending: Family Medicine | Admitting: Family Medicine

## 2013-08-24 DIAGNOSIS — N3289 Other specified disorders of bladder: Secondary | ICD-10-CM

## 2013-08-24 DIAGNOSIS — R350 Frequency of micturition: Secondary | ICD-10-CM

## 2013-08-24 LAB — POCT URINALYSIS DIP (DEVICE)
Bilirubin Urine: NEGATIVE
Glucose, UA: NEGATIVE mg/dL
Ketones, ur: NEGATIVE mg/dL
Leukocytes, UA: NEGATIVE
Nitrite: NEGATIVE
Protein, ur: NEGATIVE mg/dL
Specific Gravity, Urine: 1.015 (ref 1.005–1.030)
Urobilinogen, UA: 0.2 mg/dL (ref 0.0–1.0)
pH: 5.5 (ref 5.0–8.0)

## 2013-08-24 LAB — URINALYSIS, ROUTINE W REFLEX MICROSCOPIC
Bilirubin Urine: NEGATIVE
Glucose, UA: NEGATIVE mg/dL
Hgb urine dipstick: NEGATIVE
Ketones, ur: NEGATIVE mg/dL
Leukocytes, UA: NEGATIVE
Nitrite: NEGATIVE
Protein, ur: NEGATIVE mg/dL
Specific Gravity, Urine: 1.013 (ref 1.005–1.030)
Urobilinogen, UA: 1 mg/dL (ref 0.0–1.0)
pH: 6 (ref 5.0–8.0)

## 2013-08-24 LAB — BASIC METABOLIC PANEL
Anion gap: 16 — ABNORMAL HIGH (ref 5–15)
BUN: 12 mg/dL (ref 6–23)
CO2: 25 mEq/L (ref 19–32)
Calcium: 9.5 mg/dL (ref 8.4–10.5)
Chloride: 102 mEq/L (ref 96–112)
Creatinine, Ser: 0.98 mg/dL (ref 0.50–1.10)
GFR calc Af Amer: 70 mL/min — ABNORMAL LOW (ref >90)
GFR calc non Af Amer: 60 mL/min — ABNORMAL LOW (ref >90)
Glucose, Bld: 103 mg/dL — ABNORMAL HIGH (ref 70–99)
Potassium: 3.8 mEq/L (ref 3.7–5.3)
Sodium: 143 mEq/L (ref 137–147)

## 2013-08-24 LAB — GLUCOSE, CAPILLARY: Glucose-Capillary: 117 mg/dL — ABNORMAL HIGH (ref 70–99)

## 2013-08-24 MED ORDER — PHENAZOPYRIDINE HCL 200 MG PO TABS: 200.0000 mg | ORAL_TABLET | Freq: Three times a day (TID) | ORAL | Status: DC

## 2013-08-24 NOTE — Discharge Instructions (Signed)
Urinary Frequency The number of times a normal person urinates depends upon how much liquid they take in and how much liquid they are losing. If the temperature is hot and there is high humidity then the person will sweat more and usually breathe a little more frequently. These factors decrease the amount of frequency of urination that would be considered normal. The amount you drink is easily determined, but the amount of fluid lost is sometimes more difficult to calculate.  Fluid is lost in two ways:  Sensible fluid loss is usually measured by the amount of urine that you get rid of. Losses of fluid can also occur with diarrhea.  Insensible fluid loss is more difficult to measure. It is caused by evaporation. Insensible loss of fluid occurs through breathing and sweating. It usually ranges from a little less than a quart to a little more than a quart of fluid a day. In normal temperatures and activity levels the average person may urinate 4 to 7 times in a 24-hour period. Needing to urinate more often than that could indicate a problem. If one urinates 4 to 7 times in 24 hours and has large volumes each time, that could indicate a different problem from one who urinates 4 to 7 times a day and has small volumes. The time of urinating is also an important. Most urinating should be done during the waking hours. Getting up at night to urinate frequently can indicate some problems. CAUSES  The bladder is the organ in your lower abdomen that holds urine. Like a balloon, it swells some as it fills up. Your nerves sense this and tell you it is time to head for the bathroom. There are a number of reasons that you might feel the need to urinate more often than usual. They include:  Urinary tract infection. This is usually associated with other signs such as burning when you urinate.  In men, problems with the prostate (a walnut-size gland that is located near the tube that carries urine out of your body).  There are two reasons why the prostate can cause an increased frequency of urination:  An enlarged prostate that does not let the bladder empty well. If the bladder only half empties when you urinate then it only has half the capacity to fill before you have to urinate again.  The nerves in the bladder become more hypersensitive with an increased size of the prostate even if the bladder empties completely.  Pregnancy.  Obesity. Excess weight is more likely to cause a problem for women more than for men.  Bladder stones or other bladder problems.  Caffeine.  Alcohol.  Medications. For example, drugs that help the body get rid of extra fluid (diuretics) increase urine production. Some other medicines must be taken with lots of fluids.  Muscle or nerve weakness. This might be the result of a spinal cord injury, a stroke, multiple sclerosis or Parkinson's disease.  Long-standing diabetes can decrease the sensation of the bladder. This loss of sensation makes it harder to sense the bladder needs to be emptied. Over a period of years the bladder is stretched out by constant overfilling. This weakens the bladder muscles so that the bladder does not empty well and has less capacity to fill with new urine.  Interstitial cystitis (also called painful bladder syndrome). This condition develops because the tissues that line the insider of the bladder are inflamed (inflammation is the body's way of reacting to injury or infection). It causes pain  and frequent urination. It occurs in women more often than in men. DIAGNOSIS   To decide what might be causing your urinary frequency, your healthcare provider will probably:  Ask about symptoms you have noticed.  Ask about your overall health. This will include questions about any medications you are taking.  Do a physical examination.  Order some tests. These might include:  A blood test to check for diabetes or other health issues that could be  contributing to the problem.  Urine testing. This could measure the flow of urine and the pressure on the bladder.  A test of your neurological system (the brain, spinal cord and nerves). This is the system that senses the need to urinate.  A bladder test to check whether it is emptying completely when you urinate.  Cytoscopy. This test uses a thin tube with a tiny camera on it. It offers a look inside your urethra and bladder to see if there are problems.  Imaging tests. You might be given a contrast dye and then asked to urinate. X-rays are taken to see how your bladder is working. TREATMENT  It is important for you to be evaluated to determine if the amount or frequency that you have is unusual or abnormal. If it is found to be abnormal the cause should be determined and this can usually be found out easily. Depending upon the cause treatment could include medication, stimulation of the nerves, or surgery. There are not too many things that you can do as an individual to change your urinary frequency. It is important that you balance the amount of fluid intake needed to compensate for your activity and the temperature. Medical problems will be diagnosed and taken care of by your physician. There is no particular bladder training such as Kegel's exercises that you can do to help urinary frequency. This is an exercise this is usually done for people who have leaking of urine when they laugh cough or sneeze. HOME CARE INSTRUCTIONS   Take any medications your healthcare provider prescribed or suggested. Follow the directions carefully.  Practice any lifestyle changes that are recommended. These might include:  Drinking less fluid or drinking at different times of the day. If you need to urinate often during the night, for example, you may need to stop drinking fluids early in the evening.  Cutting down on caffeine or alcohol. They both can make you need to urinate more often than normal.  Caffeine is found in coffee, tea and sodas.  Losing weight, if that is recommended.  Keep a journal or a log. You might be asked to record how much you drink and when and when you feel the need to urinate. This will also help evaluate how well the treatment provided by your physician is working. SEEK MEDICAL CARE IF:   Your need to urinate often gets worse.  You feel increased pain or irritation when you urinate.  You notice blood in your urine.  You have questions about any medications that your healthcare provider recommended.  You notice blood, pus or swelling at the site of any test or treatment procedure.  You develop a fever of more than 100.5 F (38.1 C). SEEK IMMEDIATE MEDICAL CARE IF:  You develop a fever of more than 102.0 F (38.9 C). Document Released: 11/26/2008 Document Revised: 04/24/2011 Document Reviewed: 11/26/2008 [facility] Patient Information 2015 [place], [place]. This information is not intended to replace advice given to you by your health care provider. Make sure you discuss  any questions you have with your health care provider. ° °

## 2013-08-24 NOTE — ED Notes (Signed)
Patient c/o urinary frequency x 2 days. Also has lower back pain and feels pressure with urinating. Patient is alert and oriented and in no acute distress.

## 2013-08-24 NOTE — ED Provider Notes (Addendum)
CSN: [id number]     Arrival date & time 08/24/13  1002 History   First MD Initiated Contact with Patient 08/24/13 1057     Chief Complaint  Patient presents with  . Urinary Tract Infection   (Consider location/radiation/quality/duration/timing/severity/associated sxs/prior Treatment) HPI Comments: 63 year old female presents complaining of urinary frequency and very mild lower abdominal pain after urinating for 3 days. She also has experienced some very mild lower back pain. She thinks she may have a urinary tract infection. She denies fever, chills, NVD, flank pain, vaginal discharge, pelvic pain. No risk for STDs. No recent hospitalizations or catheterizations.  Patient is a 63 y.o. female presenting with urinary tract infection.  Urinary Tract Infection Associated symptoms include abdominal pain. Pertinent negatives include no chest pain and no shortness of breath.    Past Medical History  Diagnosis Date  . 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 HYPERLIPIDEMIA 12/23/2007  . Overweight(278.02) 01/12/2009  . HYPERTENSION 08/28/2006  . DEPRESSION 08/28/2006  . ABSCESS, BREAST, RIGHT 10/04/2009  . No pertinent past medical history     BRONCHITIS 3 WEEKS  COUGH   . GERD (gastroesophageal reflux disease)     OCC TUMS   . Dysrhythmia     HEART PALPITATIONS   . Hepatitis     JAUNDICE AGE 59   Past Surgical History  Procedure Laterality Date  . Abdominal hysterectomy    . Tonsillectomy      1969  . Tubal ligation      1980  . Carpal tunnel release      LEFT  2002  . Dilation and curettage of uterus      2011  . Total knee arthroplasty  05/08/2011    Procedure: TOTAL KNEE ARTHROPLASTY;  Surgeon: Rudean Haskell, MD;  Location: [facility];  Service: Orthopedics;  Laterality: Left;   No family history on file. History  Substance Use Topics  . Smoking status: Never Smoker   . Smokeless tobacco: Not on file  . Alcohol Use: No   OB History   Grav Para Term Preterm Abortions TAB SAB Ect Mult Living           Review of Systems  Constitutional: Negative for fever and chills.  Eyes: Negative for visual disturbance.  Respiratory: Negative for cough and shortness of breath.   Cardiovascular: Negative for chest pain, palpitations and leg swelling.  Gastrointestinal: Positive for abdominal pain. Negative for nausea and vomiting.  Endocrine: Negative for polydipsia and polyuria.  Genitourinary: Positive for urgency and frequency. Negative for dysuria.  Musculoskeletal: Negative for arthralgias and myalgias.  Skin: Negative for rash.  Neurological: Negative for dizziness, weakness and light-headedness.  All other systems reviewed and are negative.   Allergies  Review of patient's allergies indicates no known allergies.  Home Medications   Prior to Admission medications   Medication Sig Start Date End Date Taking? Authorizing Provider  amLODipine (NORVASC) 10 MG tablet Take 10 mg by mouth daily.    Historical Provider, MD  amLODipine (NORVASC) 10 MG tablet TAKE 1 TABLET BY MOUTH EVERY MORNING 04/11/12   Dorena Cookey, MD  atenolol (TENORMIN) 100 MG tablet TAKE 1 TABLET EVERY MORNING 03/08/13   Dorena Cookey, MD  buPROPion [facility]) 75 MG tablet TAKE 1 TABLET AT BEDTIME 09/20/12   Dorena Cookey, MD  furosemide (LASIX) 40 MG tablet TAKE 1 TABLET BY MOUTH EVERY MORNING 09/20/12   Dorena Cookey, MD  lisinopril (PRINIVIL,ZESTRIL) 40 MG tablet Take 1 tablet (40 mg total) by mouth  daily. 11/22/11   Doe-Hyun R Shawna Orleans, DO  lisinopril (PRINIVIL,ZESTRIL) 40 MG tablet TAKE 1 TABLET BY MOUTH TWICE A DAY 01/17/13   Dorena Cookey, MD  Multiple Vitamin (MULITIVITAMIN WITH MINERALS) TABS Take 1 tablet by mouth daily.    Historical Provider, MD  phenazopyridine (PYRIDIUM) 200 MG tablet Take 1 tablet (200 mg total) by mouth 3 (three) times daily. 08/24/13   Freeman Caldron Adiah Guereca, PA-C  simvastatin (ZOCOR) 40 MG tablet Take 40 mg by mouth at bedtime. 03/28/11   Dorena Cookey, MD  simvastatin (ZOCOR) 40 MG tablet TAKE  1 TABLET BY MOUTH DAILY AT BEDTIME 10/13/12   Dorena Cookey, MD  venlafaxine XR (EFFEXOR-XR) 150 MG 24 hr capsule TAKE ONE CAPSULE BY MOUTH TWICE A DAY 06/18/12   Dorena Cookey, MD   BP 155/71  Pulse 65  Temp(Src) 98.5 F (36.9 C) (Oral)  Resp 14  SpO2 100% Physical Exam  Nursing note and vitals reviewed. Constitutional: She is oriented to person, place, and time. Vital signs are normal. She appears well-developed and well-nourished. No distress.  HENT:  Head: Normocephalic and atraumatic.  Pulmonary/Chest: Effort normal. No respiratory distress.  Abdominal: Soft. Bowel sounds are normal. She exhibits no distension and no mass. There is no tenderness. There is no rebound, no guarding and no CVA tenderness.  Neurological: She is alert and oriented to person, place, and time. She has normal strength. Coordination normal.  Skin: Skin is warm and dry. No rash noted. She is not diaphoretic.  Psychiatric: She has a normal mood and affect. Judgment normal.    ED Course  Procedures (including critical care time) Labs Review Labs Reviewed  GLUCOSE, CAPILLARY - Abnormal; Notable for the following:    Glucose-Capillary 117 (*)    All other components within normal limits  POCT URINALYSIS DIP (DEVICE) - Abnormal; Notable for the following:    Hgb urine dipstick TRACE (*)    All other components within normal limits  URINE CULTURE  BASIC METABOLIC PANEL  URINALYSIS, ROUTINE W REFLEX MICROSCOPIC    Imaging Review No results found.   MDM   1. Urinary frequency   2. Bladder spasm    Urine shows only trace hemoglobin, and blood sugar is normal. Sending for urinalysis and BMP, and urine culture. For now, just treat with pyridium.  We'll call with any positive results.   Meds ordered this encounter  Medications  . phenazopyridine (PYRIDIUM) 200 MG tablet    Sig: Take 1 tablet (200 mg total) by mouth 3 (three) times daily.    Dispense:  12 tablet    Refill:  0    Order Specific  Question:  Supervising Provider    Answer:  Jake Michaelis, DAVID C [[id number]]       Liam Graham, PA-C 08/24/13 1120    Spoke to patient on 11:41 AM on 09/02/2013, she is still not getting better.  Urine culture was negative.  Will try ABx, if not getting better she will follow up with GYN   Meds ordered this encounter  Medications  . phenazopyridine (PYRIDIUM) 200 MG tablet    Sig: Take 1 tablet (200 mg total) by mouth 3 (three) times daily.    Dispense:  12 tablet    Refill:  0    Order Specific Question:  Supervising Provider    Answer:  Jake Michaelis, DAVID C [id number]  . cefUROXime (CEFTIN) 250 MG tablet    Sig: Take 1 tablet (250 mg total) by mouth 2 (  two) times daily with a meal.    Dispense:  10 tablet    Refill:  0    Order Specific Question:  Supervising Provider    Answer:  Ihor Gully D [place], PA-C 09/02/13 1143

## 2013-08-25 LAB — URINE CULTURE: Colony Count: 6000

## 2013-08-28 NOTE — ED Provider Notes (Signed)
Medical screening examination/treatment/procedure(s) were performed by a resident physician or non-physician practitioner and as the supervising physician I was immediately available for consultation/collaboration.  Lynne Leader, MD    Gregor Hams, MD 08/28/13 [phone]

## 2013-09-02 MED ORDER — CEFUROXIME AXETIL 250 MG PO TABS: 250.0000 mg | ORAL_TABLET | Freq: Two times a day (BID) | ORAL | Status: DC

## 2013-09-02 NOTE — ED Notes (Signed)
Zack   Spoke  With  pt

## 2013-09-03 NOTE — ED Provider Notes (Signed)
Medical screening examination/treatment/procedure(s) were performed by a resident physician or non-physician practitioner and as the supervising physician I was immediately available for consultation/collaboration.  Lynne Leader, MD    Gregor Hams, MD 09/03/13 [phone]

## 2013-09-07 NOTE — ED Notes (Signed)
patient called, requesting refill of antibiotics. Discussed w Dr Georgina Snell, who will need to evaluate this patient before new Rx is authorized. Pt advised of plan

## 2013-11-12 ENCOUNTER — Encounter: Payer: Self-pay | Admitting: Gastroenterology

## 2013-11-28 ENCOUNTER — Other Ambulatory Visit: Payer: Self-pay

## 2014-01-24 ENCOUNTER — Emergency Department (HOSPITAL_COMMUNITY)
Admission: EM | Admit: 2014-01-24 | Discharge: 2014-01-24 | Disposition: A | Discharge status: Home / Self Care | Payer: 59 | Source: Home / Self Care | Attending: Family Medicine | Admitting: Family Medicine

## 2014-01-24 ENCOUNTER — Encounter (HOSPITAL_COMMUNITY): Payer: Self-pay | Admitting: Emergency Medicine

## 2014-01-24 DIAGNOSIS — J40 Bronchitis, not specified as acute or chronic: Secondary | ICD-10-CM

## 2014-01-24 MED ORDER — IPRATROPIUM-ALBUTEROL 0.5-2.5 (3) MG/3ML IN SOLN
3.0000 mL | Freq: Once | RESPIRATORY_TRACT | Status: AC
  Administered 2014-01-24: 3 mL via RESPIRATORY_TRACT

## 2014-01-24 MED ORDER — GUAIFENESIN-CODEINE 100-10 MG/5ML PO SOLN: 5.0000 mL | Freq: Every evening | ORAL | Status: DC | PRN

## 2014-01-24 MED ORDER — IPRATROPIUM-ALBUTEROL 0.5-2.5 (3) MG/3ML IN SOLN
RESPIRATORY_TRACT | Status: AC
  Filled 2014-01-24: qty 3

## 2014-01-24 MED ORDER — IPRATROPIUM BROMIDE 0.06 % NA SOLN: 2.0000 | Freq: Four times a day (QID) | NASAL | Status: DC

## 2014-01-24 MED ORDER — PREDNISONE 10 MG PO TABS: 30.0000 mg | ORAL_TABLET | Freq: Every day | ORAL | Status: DC

## 2014-01-24 NOTE — ED Notes (Signed)
C/o cold sx onset Wednesday Sx include: facial swelling, HA, cough, wheezing Denies SOB, fevers, chills Taking ibup w/temp relief Alert, no signs of acute distress.

## 2014-01-24 NOTE — Discharge Instructions (Signed)
Thank you for coming in today. °Call or go to the emergency room if you get worse, have trouble breathing, have chest pains, or palpitations.  ° °Acute Bronchitis °Bronchitis is inflammation of the airways that extend from the windpipe into the lungs (bronchi). The inflammation often causes mucus to develop. This leads to a cough, which is the most common symptom of bronchitis.  °In acute bronchitis, the condition usually develops suddenly and goes away over time, usually in a couple weeks. Smoking, allergies, and asthma can make bronchitis worse. Repeated episodes of bronchitis may cause further lung problems.  °CAUSES °Acute bronchitis is most often caused by the same virus that causes a cold. The virus can spread from person to person (contagious) through coughing, sneezing, and touching contaminated objects. °SIGNS AND SYMPTOMS  °· Cough.   °· Fever.   °· Coughing up mucus.   °· Body aches.   °· Chest congestion.   °· Chills.   °· Shortness of breath.   °· Sore throat.   °DIAGNOSIS  °Acute bronchitis is usually diagnosed through a physical exam. Your health care provider will also ask you questions about your medical history. Tests, such as chest X-rays, are sometimes done to rule out other conditions.  °TREATMENT  °Acute bronchitis usually goes away in a couple weeks. Oftentimes, no medical treatment is necessary. Medicines are sometimes given for relief of fever or cough. Antibiotic medicines are usually not needed but may be prescribed in certain situations. In some cases, an inhaler may be recommended to help reduce shortness of breath and control the cough. A cool mist vaporizer may also be used to help thin bronchial secretions and make it easier to clear the chest.  °HOME CARE INSTRUCTIONS °· Get plenty of rest.   °· Drink enough fluids to keep your urine clear or pale yellow (unless you have a medical condition that requires fluid restriction). Increasing fluids may help thin your respiratory secretions  (sputum) and reduce chest congestion, and it will prevent dehydration.   °· Take medicines only as directed by your health care provider. °· If you were prescribed an antibiotic medicine, finish it all even if you start to feel better. °· Avoid smoking and secondhand smoke. Exposure to cigarette smoke or irritating chemicals will make bronchitis worse. If you are a smoker, consider using nicotine gum or skin patches to help control withdrawal symptoms. Quitting smoking will help your lungs heal faster.   °· Reduce the chances of another bout of acute bronchitis by washing your hands frequently, avoiding people with cold symptoms, and trying not to touch your hands to your mouth, nose, or eyes.   °· Keep all follow-up visits as directed by your health care provider.   °SEEK MEDICAL CARE IF: °Your symptoms do not improve after 1 week of treatment.  °SEEK IMMEDIATE MEDICAL CARE IF: °· You develop an increased fever or chills.   °· You have chest pain.   °· You have severe shortness of breath. °· You have bloody sputum.   °· You develop dehydration. °· You faint or repeatedly feel like you are going to pass out. °· You develop repeated vomiting. °· You develop a severe headache. °MAKE SURE YOU:  °· Understand these instructions. °· Will watch your condition. °· Will get help right away if you are not doing well or get worse. °Document Released: 03/09/2004 Document Revised: 06/16/2013 Document Reviewed: 07/23/2012 °[employer]® Patient Information ©2015 [employer]. This information is not intended to replace advice given to you by your health care provider. Make sure you discuss any questions you have with your   health care provider. ° °

## 2014-01-24 NOTE — ED Provider Notes (Signed)
Sierra Banks is a 63 y.o. female who presents to Urgent Care today for cough, nasal congestion, runny nose facial pain and pressure and ear pain. No shortness of breath. Patient has some wheezing at night. She has tried ibuprofen and Flonase which have helped only a little.   Past Medical History  Diagnosis Date  . 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 HYPERLIPIDEMIA 12/23/2007  . Overweight(278.02) 01/12/2009  . HYPERTENSION 08/28/2006  . DEPRESSION 08/28/2006  . ABSCESS, BREAST, RIGHT 10/04/2009  . No pertinent past medical history     BRONCHITIS 3 WEEKS  COUGH   . GERD (gastroesophageal reflux disease)     OCC TUMS   . Dysrhythmia     HEART PALPITATIONS   . Hepatitis     JAUNDICE AGE 37   Past Surgical History  Procedure Laterality Date  . Abdominal hysterectomy    . Tonsillectomy      1969  . Tubal ligation      1980  . Carpal tunnel release      LEFT  2002  . Dilation and curettage of uterus      2011  . Total knee arthroplasty  05/08/2011    Procedure: TOTAL KNEE ARTHROPLASTY;  Surgeon: Rudean Haskell, MD;  Location: [facility];  Service: Orthopedics;  Laterality: Left;   History  Substance Use Topics  . Smoking status: Never Smoker   . Smokeless tobacco: Not on file  . Alcohol Use: No   ROS as above Medications: No current facility-administered medications for this encounter.   Current Outpatient Prescriptions  Medication Sig Dispense Refill  . atenolol (TENORMIN) 100 MG tablet TAKE 1 TABLET EVERY MORNING 30 tablet 0  . buPROPion (WELLBUTRIN) 75 MG tablet TAKE 1 TABLET AT BEDTIME 90 tablet 1  . furosemide (LASIX) 40 MG tablet TAKE 1 TABLET BY MOUTH EVERY MORNING 100 tablet 0  . simvastatin (ZOCOR) 40 MG tablet Take 40 mg by mouth at bedtime.    Marland Kitchen venlafaxine XR (EFFEXOR-XR) 150 MG 24 hr capsule TAKE ONE CAPSULE BY MOUTH TWICE A DAY 180 capsule 1  . amLODipine (NORVASC) 10 MG tablet Take 10 mg by mouth daily.    Marland Kitchen amLODipine (NORVASC) 10 MG tablet TAKE 1 TABLET BY MOUTH EVERY MORNING  100 tablet 0  . cefUROXime (CEFTIN) 250 MG tablet Take 1 tablet (250 mg total) by mouth 2 (two) times daily with a meal. 10 tablet 0  . guaiFENesin-codeine 100-10 MG/5ML syrup Take 5 mLs by mouth at bedtime as needed for cough. 120 mL 0  . ipratropium (ATROVENT) 0.06 % nasal spray Place 2 sprays into both nostrils 4 (four) times daily. 15 mL 1  . lisinopril (PRINIVIL,ZESTRIL) 40 MG tablet Take 1 tablet (40 mg total) by mouth daily.    Marland Kitchen lisinopril (PRINIVIL,ZESTRIL) 40 MG tablet TAKE 1 TABLET BY MOUTH TWICE A DAY 30 tablet 0  . Multiple Vitamin (MULITIVITAMIN WITH MINERALS) TABS Take 1 tablet by mouth daily.    . phenazopyridine (PYRIDIUM) 200 MG tablet Take 1 tablet (200 mg total) by mouth 3 (three) times daily. 12 tablet 0  . predniSONE (DELTASONE) 10 MG tablet Take 3 tablets (30 mg total) by mouth daily. 15 tablet 0  . simvastatin (ZOCOR) 40 MG tablet TAKE 1 TABLET BY MOUTH DAILY AT BEDTIME 100 tablet 0   No Known Allergies   Exam:  BP 139/86 mmHg  Pulse 66  Temp(Src) 98.6 F (37 C) (Oral)  Resp 16  SpO2 95% Gen: Well NAD HEENT: EOMI,  MMM posterior first with cobblestoning. Normal tympanic membranes bilaterally. Clear nasal discharge. Sinuses are nontender. Lungs: Normal work of breathing. CTABL Heart: RRR no MRG Abd: NABS, Soft. Nondistended, Nontender Exts: Brisk capillary refill, warm and well perfused.   Patient was given a 2.5/0.5 mg DuoNeb nebulizer treatment, and did not feel much better  No results found for this or any previous visit (from the past 24 hour(s)). No results found.  Assessment and Plan: 63 y.o. female with bronchitis. Likely due to a virus. Treatment with Atrovent nasal spray codeine containing cough medication and prednisone.  Discussed warning signs or symptoms. Please see discharge instructions. Patient expresses understanding.     Gregor Hams, MD 01/24/14 [phone]

## 2014-02-13 HISTORY — PX: OTHER SURGICAL HISTORY: SHX169

## 2014-06-24 ENCOUNTER — Encounter: Payer: Self-pay | Admitting: Gastroenterology

## 2014-07-17 ENCOUNTER — Encounter: Payer: Self-pay | Admitting: Gastroenterology

## 2014-08-12 ENCOUNTER — Encounter (HOSPITAL_BASED_OUTPATIENT_CLINIC_OR_DEPARTMENT_OTHER): Payer: Self-pay | Admitting: *Deleted

## 2014-08-20 ENCOUNTER — Other Ambulatory Visit: Payer: Self-pay

## 2014-08-20 ENCOUNTER — Encounter (HOSPITAL_BASED_OUTPATIENT_CLINIC_OR_DEPARTMENT_OTHER)
Admission: RE | Admit: 2014-08-20 | Discharge: 2014-08-20 | Disposition: A | Discharge status: Home / Self Care | Payer: Commercial Managed Care - HMO | Source: Ambulatory Visit | Attending: Orthopedic Surgery | Admitting: Orthopedic Surgery

## 2014-08-20 ENCOUNTER — Encounter (HOSPITAL_BASED_OUTPATIENT_CLINIC_OR_DEPARTMENT_OTHER): Payer: Self-pay | Admitting: *Deleted

## 2014-08-20 DIAGNOSIS — Z96652 Presence of left artificial knee joint: Secondary | ICD-10-CM | POA: Diagnosis not present

## 2014-08-20 DIAGNOSIS — Z7982 Long term (current) use of aspirin: Secondary | ICD-10-CM | POA: Diagnosis not present

## 2014-08-20 DIAGNOSIS — Z6836 Body mass index (BMI) 36.0-36.9, adult: Secondary | ICD-10-CM | POA: Diagnosis not present

## 2014-08-20 DIAGNOSIS — I10 Essential (primary) hypertension: Secondary | ICD-10-CM | POA: Diagnosis not present

## 2014-08-20 DIAGNOSIS — G709 Myoneural disorder, unspecified: Secondary | ICD-10-CM | POA: Diagnosis not present

## 2014-08-20 DIAGNOSIS — R2231 Localized swelling, mass and lump, right upper limb: Secondary | ICD-10-CM | POA: Diagnosis present

## 2014-08-20 DIAGNOSIS — K219 Gastro-esophageal reflux disease without esophagitis: Secondary | ICD-10-CM | POA: Diagnosis not present

## 2014-08-20 DIAGNOSIS — F329 Major depressive disorder, single episode, unspecified: Secondary | ICD-10-CM | POA: Diagnosis not present

## 2014-08-20 DIAGNOSIS — D2111 Benign neoplasm of connective and other soft tissue of right upper limb, including shoulder: Secondary | ICD-10-CM | POA: Diagnosis not present

## 2014-08-20 DIAGNOSIS — R29898 Other symptoms and signs involving the musculoskeletal system: Secondary | ICD-10-CM | POA: Diagnosis not present

## 2014-08-20 DIAGNOSIS — E785 Hyperlipidemia, unspecified: Secondary | ICD-10-CM | POA: Diagnosis not present

## 2014-08-20 LAB — BASIC METABOLIC PANEL
Anion gap: 7 (ref 5–15)
BUN: 15 mg/dL (ref 6–20)
CO2: 29 mmol/L (ref 22–32)
Calcium: 9.7 mg/dL (ref 8.9–10.3)
Chloride: 106 mmol/L (ref 101–111)
Creatinine, Ser: 1.17 mg/dL — ABNORMAL HIGH (ref 0.44–1.00)
GFR calc Af Amer: 56 mL/min — ABNORMAL LOW (ref >60)
GFR calc non Af Amer: 48 mL/min — ABNORMAL LOW (ref >60)
Glucose, Bld: 130 mg/dL — ABNORMAL HIGH (ref 65–99)
Potassium: 3.5 mmol/L (ref 3.5–5.1)
Sodium: 142 mmol/L (ref 135–145)

## 2014-08-21 ENCOUNTER — Ambulatory Visit (HOSPITAL_BASED_OUTPATIENT_CLINIC_OR_DEPARTMENT_OTHER): Payer: Commercial Managed Care - HMO | Admitting: Certified Registered"

## 2014-08-21 ENCOUNTER — Encounter (HOSPITAL_BASED_OUTPATIENT_CLINIC_OR_DEPARTMENT_OTHER): Payer: Self-pay | Admitting: Certified Registered"

## 2014-08-21 ENCOUNTER — Encounter (HOSPITAL_BASED_OUTPATIENT_CLINIC_OR_DEPARTMENT_OTHER)
Admission: RE | Disposition: A | Discharge status: Home / Self Care | Payer: Self-pay | Source: Ambulatory Visit | Attending: Orthopedic Surgery

## 2014-08-21 ENCOUNTER — Ambulatory Visit (HOSPITAL_BASED_OUTPATIENT_CLINIC_OR_DEPARTMENT_OTHER)
Admission: RE | Admit: 2014-08-21 | Discharge: 2014-08-21 | Disposition: A | Discharge status: Home / Self Care | Payer: Commercial Managed Care - HMO | Source: Ambulatory Visit | Attending: Orthopedic Surgery | Admitting: Orthopedic Surgery

## 2014-08-21 DIAGNOSIS — F329 Major depressive disorder, single episode, unspecified: Secondary | ICD-10-CM | POA: Insufficient documentation

## 2014-08-21 DIAGNOSIS — D2111 Benign neoplasm of connective and other soft tissue of right upper limb, including shoulder: Secondary | ICD-10-CM | POA: Insufficient documentation

## 2014-08-21 DIAGNOSIS — Z6836 Body mass index (BMI) 36.0-36.9, adult: Secondary | ICD-10-CM | POA: Insufficient documentation

## 2014-08-21 DIAGNOSIS — Z7982 Long term (current) use of aspirin: Secondary | ICD-10-CM | POA: Insufficient documentation

## 2014-08-21 DIAGNOSIS — R29898 Other symptoms and signs involving the musculoskeletal system: Secondary | ICD-10-CM | POA: Insufficient documentation

## 2014-08-21 DIAGNOSIS — K219 Gastro-esophageal reflux disease without esophagitis: Secondary | ICD-10-CM | POA: Insufficient documentation

## 2014-08-21 DIAGNOSIS — Z96652 Presence of left artificial knee joint: Secondary | ICD-10-CM | POA: Insufficient documentation

## 2014-08-21 DIAGNOSIS — I10 Essential (primary) hypertension: Secondary | ICD-10-CM | POA: Insufficient documentation

## 2014-08-21 DIAGNOSIS — G709 Myoneural disorder, unspecified: Secondary | ICD-10-CM | POA: Insufficient documentation

## 2014-08-21 DIAGNOSIS — E785 Hyperlipidemia, unspecified: Secondary | ICD-10-CM | POA: Insufficient documentation

## 2014-08-21 HISTORY — PX: MASS EXCISION: SHX2000

## 2014-08-21 LAB — POCT HEMOGLOBIN-HEMACUE: Hemoglobin: 14.3 g/dL (ref 12.0–15.0)

## 2014-08-21 SURGERY — EXCISION MASS
Anesthesia: General | Site: Arm Lower | Laterality: Right

## 2014-08-21 MED ORDER — LIDOCAINE HCL (CARDIAC) 20 MG/ML IV SOLN
INTRAVENOUS | Status: DC | PRN
  Administered 2014-08-21: 60 mg via INTRAVENOUS

## 2014-08-21 MED ORDER — OXYCODONE HCL 5 MG PO TABS
ORAL_TABLET | ORAL | Status: AC
  Filled 2014-08-21: qty 1

## 2014-08-21 MED ORDER — HYDROMORPHONE HCL 1 MG/ML IJ SOLN: 0.2500 mg | INTRAMUSCULAR | Status: DC | PRN

## 2014-08-21 MED ORDER — PROMETHAZINE HCL 25 MG/ML IJ SOLN
6.2500 mg | INTRAMUSCULAR | Status: DC | PRN
Start: 2014-08-21 — End: 2014-08-21

## 2014-08-21 MED ORDER — SCOPOLAMINE 1 MG/3DAYS TD PT72: 1.0000 | MEDICATED_PATCH | Freq: Once | TRANSDERMAL | Status: DC | PRN

## 2014-08-21 MED ORDER — KETOROLAC TROMETHAMINE 30 MG/ML IJ SOLN: 30.0000 mg | Freq: Once | INTRAMUSCULAR | Status: DC | PRN

## 2014-08-21 MED ORDER — 0.9 % SODIUM CHLORIDE (POUR BTL) OPTIME
TOPICAL | Status: DC | PRN
  Administered 2014-08-21: 400 mL

## 2014-08-21 MED ORDER — PROPOFOL 10 MG/ML IV BOLUS
INTRAVENOUS | Status: DC | PRN
  Administered 2014-08-21: 200 mg via INTRAVENOUS

## 2014-08-21 MED ORDER — OXYCODONE HCL 5 MG PO TABS
5.0000 mg | ORAL_TABLET | Freq: Once | ORAL | Status: AC | PRN
  Administered 2014-08-21: 5 mg via ORAL

## 2014-08-21 MED ORDER — GLYCOPYRROLATE 0.2 MG/ML IJ SOLN: 0.2000 mg | Freq: Once | INTRAMUSCULAR | Status: DC | PRN

## 2014-08-21 MED ORDER — CEFAZOLIN SODIUM-DEXTROSE 2-3 GM-% IV SOLR
INTRAVENOUS | Status: AC
  Filled 2014-08-21: qty 50

## 2014-08-21 MED ORDER — BUPIVACAINE HCL (PF) 0.25 % IJ SOLN
INTRAMUSCULAR | Status: DC | PRN
  Administered 2014-08-21: 20 mL

## 2014-08-21 MED ORDER — MIDAZOLAM HCL 2 MG/2ML IJ SOLN
INTRAMUSCULAR | Status: AC
  Filled 2014-08-21: qty 2

## 2014-08-21 MED ORDER — FENTANYL CITRATE (PF) 100 MCG/2ML IJ SOLN
INTRAMUSCULAR | Status: AC
  Filled 2014-08-21: qty 6

## 2014-08-21 MED ORDER — ONDANSETRON HCL 4 MG/2ML IJ SOLN
INTRAMUSCULAR | Status: DC | PRN
Start: 2014-08-21 — End: 2014-08-21
  Administered 2014-08-21: 4 mg via INTRAVENOUS

## 2014-08-21 MED ORDER — MIDAZOLAM HCL 2 MG/2ML IJ SOLN
1.0000 mg | INTRAMUSCULAR | Status: DC | PRN
  Administered 2014-08-21: 2 mg via INTRAVENOUS

## 2014-08-21 MED ORDER — OXYCODONE HCL 5 MG/5ML PO SOLN: 5.0000 mg | Freq: Once | ORAL | Status: AC | PRN

## 2014-08-21 MED ORDER — BUPIVACAINE HCL (PF) 0.25 % IJ SOLN
INTRAMUSCULAR | Status: AC
  Filled 2014-08-21: qty 30

## 2014-08-21 MED ORDER — FENTANYL CITRATE (PF) 100 MCG/2ML IJ SOLN
50.0000 ug | INTRAMUSCULAR | Status: AC | PRN
  Administered 2014-08-21: 50 ug via INTRAVENOUS
  Administered 2014-08-21 (×2): 25 ug via INTRAVENOUS

## 2014-08-21 MED ORDER — DEXAMETHASONE SODIUM PHOSPHATE 10 MG/ML IJ SOLN
INTRAMUSCULAR | Status: DC | PRN
  Administered 2014-08-21: 10 mg via INTRAVENOUS

## 2014-08-21 MED ORDER — LACTATED RINGERS IV SOLN
INTRAVENOUS | Status: DC
Start: 2014-08-21 — End: 2014-08-21
  Administered 2014-08-21 (×2): via INTRAVENOUS

## 2014-08-21 MED ORDER — OXYCODONE HCL 5 MG PO TABS: 10.0000 mg | ORAL_TABLET | ORAL | Status: DC | PRN

## 2014-08-21 MED ORDER — CEFAZOLIN SODIUM-DEXTROSE 2-3 GM-% IV SOLR
INTRAVENOUS | Status: DC | PRN
  Administered 2014-08-21: 2 g via INTRAVENOUS

## 2014-08-21 SURGICAL SUPPLY — 70 items
BANDAGE COBAN STERILE 2 (GAUZE/BANDAGES/DRESSINGS) IMPLANT
BANDAGE ELASTIC 3 VELCRO ST LF (GAUZE/BANDAGES/DRESSINGS) IMPLANT
BANDAGE ELASTIC 4 VELCRO ST LF (GAUZE/BANDAGES/DRESSINGS) ×1 IMPLANT
BLADE SURG 15 STRL LF DISP TIS (BLADE) ×2 IMPLANT
BLADE SURG 15 STRL SS (BLADE) ×4
BNDG COHESIVE 1X5 TAN STRL LF (GAUZE/BANDAGES/DRESSINGS) IMPLANT
BNDG COHESIVE 3X5 TAN STRL LF (GAUZE/BANDAGES/DRESSINGS) IMPLANT
BNDG CONFORM 3 STRL LF (GAUZE/BANDAGES/DRESSINGS) ×2 IMPLANT
BNDG GAUZE ELAST 4 BULKY (GAUZE/BANDAGES/DRESSINGS) ×1 IMPLANT
BRUSH SCRUB EZ PLAIN DRY (MISCELLANEOUS) ×2 IMPLANT
CORDS BIPOLAR (ELECTRODE) ×2 IMPLANT
COVER BACK TABLE 60X90IN (DRAPES) ×2 IMPLANT
CUFF TOURNIQUET SINGLE 18IN (TOURNIQUET CUFF) IMPLANT
CUFF TOURNIQUET SINGLE 24IN (TOURNIQUET CUFF) ×1 IMPLANT
DECANTER SPIKE VIAL GLASS SM (MISCELLANEOUS) IMPLANT
DRAPE EXTREMITY T 121X128X90 (DRAPE) ×2 IMPLANT
DRAPE SURG 17X23 STRL (DRAPES) ×2 IMPLANT
DRSG EMULSION OIL 3X3 NADH (GAUZE/BANDAGES/DRESSINGS) ×2 IMPLANT
GAUZE SPONGE 4X4 12PLY STRL (GAUZE/BANDAGES/DRESSINGS) IMPLANT
GAUZE XEROFORM 1X8 LF (GAUZE/BANDAGES/DRESSINGS) ×1 IMPLANT
GLOVE BIOGEL M STRL SZ7.5 (GLOVE) IMPLANT
GLOVE BIOGEL PI IND STRL 7.0 (GLOVE) IMPLANT
GLOVE BIOGEL PI IND STRL 7.5 (GLOVE) IMPLANT
GLOVE BIOGEL PI INDICATOR 7.0 (GLOVE) ×3
GLOVE BIOGEL PI INDICATOR 7.5 (GLOVE) ×1
GLOVE ECLIPSE 6.5 STRL STRAW (GLOVE) ×1 IMPLANT
GLOVE SS BIOGEL STRL SZ 8 (GLOVE) ×1 IMPLANT
GLOVE SUPERSENSE BIOGEL SZ 8 (GLOVE) ×1
GLOVE SURG SS PI 7.5 STRL IVOR (GLOVE) ×1 IMPLANT
GOWN STRL REUS W/ TWL LRG LVL3 (GOWN DISPOSABLE) ×1 IMPLANT
GOWN STRL REUS W/ TWL XL LVL3 (GOWN DISPOSABLE) ×1 IMPLANT
GOWN STRL REUS W/TWL LRG LVL3 (GOWN DISPOSABLE) ×2
GOWN STRL REUS W/TWL XL LVL3 (GOWN DISPOSABLE) ×4
LOOP VESSEL MAXI BLUE (MISCELLANEOUS) IMPLANT
NDL HYPO 25X1 1.5 SAFETY (NEEDLE) ×1 IMPLANT
NEEDLE HYPO 22GX1.5 SAFETY (NEEDLE) IMPLANT
NEEDLE HYPO 25X1 1.5 SAFETY (NEEDLE) ×2 IMPLANT
NS IRRIG 1000ML POUR BTL (IV SOLUTION) ×2 IMPLANT
PACK BASIN DAY SURGERY FS (CUSTOM PROCEDURE TRAY) ×2 IMPLANT
PAD ALCOHOL SWAB (MISCELLANEOUS) IMPLANT
PAD CAST 3X4 CTTN HI CHSV (CAST SUPPLIES) IMPLANT
PAD CAST 4YDX4 CTTN HI CHSV (CAST SUPPLIES) IMPLANT
PADDING CAST ABS 3INX4YD NS (CAST SUPPLIES)
PADDING CAST ABS 4INX4YD NS (CAST SUPPLIES)
PADDING CAST ABS COTTON 3X4 (CAST SUPPLIES) IMPLANT
PADDING CAST ABS COTTON 4X4 ST (CAST SUPPLIES) IMPLANT
PADDING CAST COTTON 3X4 STRL (CAST SUPPLIES) ×2
PADDING CAST COTTON 4X4 STRL (CAST SUPPLIES) ×2
SHEET MEDIUM DRAPE 40X70 STRL (DRAPES) ×2 IMPLANT
SPLINT FIBERGLASS 3X35 (CAST SUPPLIES) ×1 IMPLANT
SPLINT PLASTER CAST XFAST 3X15 (CAST SUPPLIES) IMPLANT
SPLINT PLASTER CAST XFAST 4X15 (CAST SUPPLIES) IMPLANT
SPLINT PLASTER XTRA FAST SET 4 (CAST SUPPLIES)
SPLINT PLASTER XTRA FASTSET 3X (CAST SUPPLIES)
STOCKINETTE 4X48 STRL (DRAPES) ×2 IMPLANT
STOCKINETTE SYNTHETIC 3 UNSTER (CAST SUPPLIES) IMPLANT
STOCKINETTE SYNTHETIC 4 NONSTR (MISCELLANEOUS) ×1 IMPLANT
STRIP CLOSURE SKIN 1/2X4 (GAUZE/BANDAGES/DRESSINGS) ×1 IMPLANT
SUT PROLENE 4 0 PS 2 18 (SUTURE) ×2 IMPLANT
SUT PROLENE 5 0 P 3 (SUTURE) IMPLANT
SUT VIC AB 3-0 PS1 18 (SUTURE) ×2
SUT VIC AB 3-0 PS1 18XBRD (SUTURE) IMPLANT
SUT VIC AB 3-0 SH 27 (SUTURE) ×2
SUT VIC AB 3-0 SH 27X BRD (SUTURE) IMPLANT
SUT VICRYL 4-0 PS2 18IN ABS (SUTURE) ×1 IMPLANT
SYR BULB 3OZ (MISCELLANEOUS) ×2 IMPLANT
SYR CONTROL 10ML LL (SYRINGE) ×2 IMPLANT
TOWEL OR 17X24 6PK STRL BLUE (TOWEL DISPOSABLE) ×2 IMPLANT
TOWEL OR NON WOVEN STRL DISP B (DISPOSABLE) ×2 IMPLANT
UNDERPAD 30X30 (UNDERPADS AND DIAPERS) ×2 IMPLANT

## 2014-08-21 NOTE — Anesthesia Preprocedure Evaluation (Addendum)
Anesthesia Evaluation  Patient identified by MRN, date of birth, ID band Patient awake    Reviewed: Allergy & Precautions, NPO status , Patient's Chart, lab work & pertinent test results, reviewed documented beta blocker date and time   Airway Mallampati: III  TM Distance: >3 FB Neck ROM: Full    Dental   Pulmonary neg pulmonary ROS,  breath sounds clear to auscultation        Cardiovascular hypertension, Pt. on home beta blockers and Pt. on medications Rhythm:Regular Rate:Normal     Neuro/Psych PSYCHIATRIC DISORDERS Depression negative neurological ROS     GI/Hepatic Neg liver ROS, GERD-  Medicated,  Endo/Other  Morbid obesity  Renal/GU negative Renal ROS     Musculoskeletal   Abdominal   Peds  Hematology   Anesthesia Other Findings   Reproductive/Obstetrics                            Anesthesia Physical Anesthesia Plan  ASA: II  Anesthesia Plan: General   Post-op Pain Management:    Induction: Intravenous  Airway Management Planned: LMA  Additional Equipment:   Intra-op Plan:   Post-operative Plan:   Informed Consent: I have reviewed the patients History and Physical, chart, labs and discussed the procedure including the risks, benefits and alternatives for the proposed anesthesia with the patient or authorized representative who has indicated his/her understanding and acceptance.   Dental advisory given  Plan Discussed with: CRNA  Anesthesia Plan Comments:         Anesthesia Quick Evaluation

## 2014-08-21 NOTE — Anesthesia Postprocedure Evaluation (Signed)
  Anesthesia Post-op Note  Patient: Sierra Banks  Procedure(s) Performed: Procedure(s): RIGHT FOREARM MASS EXCISION WITH REPAIR AND RECONSTRUCTION (Right)  Patient Location: PACU  Anesthesia Type:General  Level of Consciousness: awake, alert  and oriented  Airway and Oxygen Therapy: Patient Spontanous Breathing  Post-op Pain: mild  Post-op Assessment: Post-op Vital signs reviewed              Post-op Vital Signs: Reviewed  Last Vitals:  Filed Vitals:   08/21/14 1054  BP: 111/84  Pulse: 62  Temp: 36.4 C  Resp: 14    Complications: No apparent anesthesia complications

## 2014-08-21 NOTE — H&P (Signed)
Sierra Banks is an 64 y.o. female.   Chief Complaint: right forearm mass HPI: patient presents for removal of a right forearm mass.  She denies other complaints. Patient presents for evaluation and treatment of the of their upper extremity predicament. The patient denies neck, back, chest or  abdominal pain. The patient notes that they have no lower extremity problems. The patients primary complaint is noted. We are planning surgical care pathway for the upper extremity.   Past Medical History  Diagnosis Date  . 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 HYPERLIPIDEMIA 12/23/2007  . Overweight(278.02) 01/12/2009  . HYPERTENSION 08/28/2006  . DEPRESSION 08/28/2006  . ABSCESS, BREAST, RIGHT 10/04/2009  . GERD (gastroesophageal reflux disease)     OCC TUMS   . Dysrhythmia     HEART PALPITATIONS   . Hepatitis     JAUNDICE AGE 16    Past Surgical History  Procedure Laterality Date  . Tonsillectomy      1969  . Tubal ligation      1980  . Carpal tunnel release      LEFT  2002  . Dilation and curettage of uterus      2011  . Total knee arthroplasty  05/08/2011    Procedure: TOTAL KNEE ARTHROPLASTY;  Surgeon: Rudean Haskell, MD;  Location: [facility];  Service: Orthopedics;  Laterality: Left;  . Joint replacement Left     History reviewed. No pertinent family history. Social History:  reports that she has never smoked. She does not have any smokeless tobacco history on file. She reports that she does not drink alcohol or use illicit drugs.  Allergies: No Known Allergies  Medications Prior to Admission  Medication Sig Dispense Refill  . amLODipine (NORVASC) 10 MG tablet TAKE 1 TABLET BY MOUTH EVERY MORNING 100 tablet 0  . aspirin 81 MG tablet Take 81 mg by mouth daily.    Marland Kitchen atenolol (TENORMIN) 100 MG tablet TAKE 1 TABLET EVERY MORNING 30 tablet 0  . buPROPion (WELLBUTRIN) 75 MG tablet TAKE 1 TABLET AT BEDTIME 90 tablet 1  . cetirizine (ZYRTEC) 10 MG tablet Take 10 mg by mouth daily.    . cloNIDine  (CATAPRES) 0.1 MG tablet Take 0.1 mg by mouth 2 (two) times daily.    . furosemide (LASIX) 40 MG tablet TAKE 1 TABLET BY MOUTH EVERY MORNING 100 tablet 0  . losartan (COZAAR) 100 MG tablet Take 100 mg by mouth daily.    . Multiple Vitamin (MULITIVITAMIN WITH MINERALS) TABS Take 1 tablet by mouth daily.    . potassium chloride SA (K-DUR,KLOR-CON) 20 MEQ tablet Take 20 mEq by mouth 2 (two) times daily.    . simvastatin (ZOCOR) 40 MG tablet Take 40 mg by mouth at bedtime.    . topiramate (TOPAMAX) 25 MG tablet Take 25 mg by mouth 2 (two) times daily.    Marland Kitchen venlafaxine XR (EFFEXOR-XR) 150 MG 24 hr capsule TAKE ONE CAPSULE BY MOUTH TWICE A DAY 180 capsule 1    Results for orders placed or performed during the hospital encounter of 08/21/14 (from the past 48 hour(s))  Basic metabolic panel     Status: Abnormal   Collection Time: 08/20/14  2:04 PM  Result Value Ref Range   Sodium 142 135 - 145 mmol/L   Potassium 3.5 3.5 - 5.1 mmol/L   Chloride 106 101 - 111 mmol/L   CO2 29 22 - 32 mmol/L   Glucose, Bld 130 (H) 65 - 99 mg/dL   BUN 15 6 -  20 mg/dL   Creatinine, Ser 1.17 (H) 0.44 - 1.00 mg/dL   Calcium 9.7 8.9 - 10.3 mg/dL   GFR calc non Af Amer 48 (L) >60 mL/min   GFR calc Af Amer 56 (L) >60 mL/min    Comment: (NOTE) The eGFR has been calculated using the CKD EPI equation. This calculation has not been validated in all clinical situations. eGFR's persistently <60 mL/min signify possible Chronic Kidney Disease.    Anion gap 7 5 - 15  Hemoglobin-hemacue, POC     Status: None   Collection Time: 08/21/14  7:59 AM  Result Value Ref Range   Hemoglobin 14.3 12.0 - 15.0 g/dL   No results found.  ROS  Blood pressure 147/68, pulse 68, temperature 97.6 F (36.4 C), temperature source Oral, resp. rate 18, height 5' 4.5" (1.638 m), weight 98.884 kg (218 lb), SpO2 97 %. Physical Exam right forearm mass 6 x 5 cm.  This is intermittently painful. She is neurovascularly intact. The patient is alert  and oriented in no acute distress. The patient complains of pain in the affected upper extremity.  The patient is noted to have a normal HEENT exam. Lung fields show equal chest expansion and no shortness of breath. Abdomen exam is nontender without distention. Lower extremity examination does not show any fracture dislocation or blood clot symptoms. Pelvis is stable and the neck and back are stable and nontender. Assessment/Plan We will plan for removal of her right forearm mass is necessary  We are planning surgery for your upper extremity. The risk and benefits of surgery to include risk of bleeding, infection, anesthesia,  damage to normal structures and failure of the surgery to accomplish its intended goals of relieving symptoms and restoring function have been discussed in detail. With this in mind we plan to proceed. I have specifically discussed with the patient the pre-and postoperative regime and the dos and don'ts and risk and benefits in great detail. Risk and benefits of surgery also include risk of dystrophy(CRPS), chronic nerve pain, failure of the healing process to go onto completion and other inherent risks of surgery The relavent the pathophysiology of the disease/injury process, as well as the alternatives for treatment and postoperative course of action has been discussed in great detail with the patient who desires to proceed.  We will do everything in our power to help you (the patient) restore function to the upper extremity. It is a pleasure to see this patient today.   Randilyn Foisy III,Mele Sylvester M 08/21/2014, 8:40 AM

## 2014-08-21 NOTE — Transfer of Care (Signed)
Immediate Anesthesia Transfer of Care Note  Patient: Sierra Banks  Procedure(s) Performed: Procedure(s): RIGHT FOREARM MASS EXCISION WITH REPAIR AND RECONSTRUCTION (Right)  Patient Location: PACU  Anesthesia Type:General  Level of Consciousness: awake and patient cooperative  Airway & Oxygen Therapy: Patient Spontanous Breathing and Patient connected to face mask oxygen  Post-op Assessment: Report given to RN and Post -op Vital signs reviewed and stable  Post vital signs: Reviewed and stable  Last Vitals:  Filed Vitals:   08/21/14 0726  BP: 147/68  Pulse: 68  Temp: 36.4 C  Resp: 18    Complications: No apparent anesthesia complications

## 2014-08-21 NOTE — Op Note (Signed)
See op note dictation [phone] Grant Swager MD

## 2014-08-21 NOTE — Anesthesia Procedure Notes (Signed)
Procedure Name: LMA Insertion Date/Time: 08/21/2014 8:57 AM Performed by: Tobey Schmelzle D Pre-anesthesia Checklist: Patient identified, Emergency Drugs available, Suction available and Patient being monitored Patient Re-evaluated:Patient Re-evaluated prior to inductionOxygen Delivery Method: Circle System Utilized Preoxygenation: Pre-oxygenation with 100% oxygen Intubation Type: IV induction Ventilation: Mask ventilation without difficulty LMA: LMA inserted LMA Size: 4.0 Number of attempts: 1 Airway Equipment and Method: Bite block Placement Confirmation: positive ETCO2 Tube secured with: Tape Dental Injury: Teeth and Oropharynx as per pre-operative assessment

## 2014-08-21 NOTE — Discharge Instructions (Signed)
We recommend that you to take vitamin C 1000 mg a day to promote healing. We also recommend that if you require  pain medicine that you take a stool softener to prevent constipation as most pain medicines will have constipation side effects. We recommend either Peri-Colace or Senokot and recommend that you also consider adding MiraLAX to prevent the constipation affects from pain medicine if you are required to use them. These medicines are over the counter and maybe purchased at a local pharmacy. A cup of yogurt and a probiotic can also be helpful during the recovery process as the medicines can disrupt your intestinal environment. Keep bandage clean and dry.  Call for any problems.  No smoking.  Criteria for driving a car: you should be off your pain medicine for 7-8 hours, able to drive one handed(confident), thinking clearly and feeling able in your judgement to drive. Continue elevation as it will decrease swelling.  If instructed by MD move your fingers within the confines of the bandage/splint.  Use ice if instructed by your MD. Call immediately for any sudden loss of feeling in your hand/arm or change in functional abilities of the extremity.   Post Anesthesia Home Care Instructions  Activity: Get plenty of rest for the remainder of the day. A responsible adult should stay with you for 24 hours following the procedure.  For the next 24 hours, DO NOT: -Drive a car -Paediatric nurse -Drink alcoholic beverages -Take any medication unless instructed by your physician -Make any legal decisions or sign important papers.  Meals: Start with liquid foods such as gelatin or soup. Progress to regular foods as tolerated. Avoid greasy, spicy, heavy foods. If nausea and/or vomiting occur, drink only clear liquids until the nausea and/or vomiting subsides. Call your physician if vomiting continues.  Special Instructions/Symptoms: Your throat may feel dry or sore from the anesthesia or the breathing  tube placed in your throat during surgery. If this causes discomfort, gargle with warm salt water. The discomfort should disappear within 24 hours.  If you had a scopolamine patch placed behind your ear for the management of post- operative nausea and/or vomiting:  1. The medication in the patch is effective for 72 hours, after which it should be removed.  Wrap patch in a tissue and discard in the trash. Wash hands thoroughly with soap and water. 2. You may remove the patch earlier than 72 hours if you experience unpleasant side effects which may include dry mouth, dizziness or visual disturbances. 3. Avoid touching the patch. Wash your hands with soap and water after contact with the patch.

## 2014-08-24 ENCOUNTER — Encounter (HOSPITAL_BASED_OUTPATIENT_CLINIC_OR_DEPARTMENT_OTHER): Payer: Self-pay | Admitting: Orthopedic Surgery

## 2014-08-24 NOTE — Op Note (Signed)
Sierra Banks, Sierra Banks              ACCOUNT NO.:  [id number]  MEDICAL RECORD NO.:  [id number]  LOCATION:                               FACILITY:  [facility]  PHYSICIAN:  Satira Anis. Karmine Kauer, M.D.DATE OF BIRTH:  1950/12/18  DATE OF PROCEDURE:  08/21/2014 DATE OF DISCHARGE:  08/21/2014                              OPERATIVE REPORT   PREOPERATIVE DIAGNOSIS:  Large forearm mass, right forearm.  POSTOPERATIVE DIAGNOSIS:  Large forearm mass, right forearm.  PROCEDURE: 1. Removal of large forearm mass, right forearm, approximately 7 x 6     cm deep in location. 2. Superficial radial nerve neurolysis, right forearm.  SURGEON:  Satira Anis. Amedeo Plenty, M.D.  ASSISTANT:  None.  COMPLICATIONS:  None.  ANESTHESIA:  General.  TOURNIQUET TIME:  Less than hour.  SPECIMENS:  Wound.  INDICATIONS FOR PROCEDURE:  A 64 year old female who presents with large soft tissue mass in the forearm.  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  I have counseled her in regard to risks and benefits of surgery and she desires to proceed as this is bothersome to her.  OPERATIVE PROCEDURE:  The patient was seen by myself and Anesthesia, taken to operative suite, underwent smooth induction of general anesthesia, laid supine, appropriately padded, prepped and draped in usual sterile fashion with Betadine scrub and paint.  Preoperative antibiotics were given in the form of Ancef. Once this was complete, final time-out was called and incision was made dorsal radially and dissection was carried down.  I circumferentially identified the mass. The mass was below the subcutaneous tissue and a plane was developed here.  Deeply the plane had investments of the venous architecture going into it and these were tied off sequentially with 3-0 Vicryl ties and the mass was then mobilized.  It was somewhat adherent to the deep fashion, so I very carefully and cautiously removed portions of the  deep fascia with the mass exposing the ECRB and ECRL tendons.  More radially, the superficial radial nerve was identified and mobilized.  I had to perform a distinct and separate superficial radial nerve and lateral antebrachial cutaneous nerve neurolysis.  This was extensive in nature and there were no complicating features.  The patient tolerated this well.  Once this neurolysis was complete, I then set forth dissecting deeply in a proximal distal direction.  I closed off venous feeders with Vicryl sutures and ultimately removed the mass.  Following this, I took intraoperative photos of the mass, which were given to the patient and the family.  The mass was then sent for specimen in formalin.  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  The patient tolerated this well.  The mass was approximately 7 x 6 cm. There were no complicating features.  The patient will be monitored in the recovery room.  We irrigated copiously, closed the wound in layers of 4-0 Vicryl followed by subcuticular Prolene.  Hemostasis was excellent and there were no complicating features.  The patient will be monitored in the recovery room, discharged home, Percocet p.r.n. pain.  See me back in the office in 12 days.  Notify us should any problems occur.  Satira Anis. Amedeo Plenty, M.D.   ______________________________ Satira Anis. Amedeo Plenty, M.D.    [facility]  D:  08/21/2014  T:  08/22/2014  Job:  [id number]

## 2014-10-01 ENCOUNTER — Ambulatory Visit (AMBULATORY_SURGERY_CENTER): Payer: Self-pay

## 2014-10-01 VITALS — Ht 64.5 in | Wt 215.6 lb

## 2014-10-01 DIAGNOSIS — Z800 Family history of malignant neoplasm of digestive organs: Secondary | ICD-10-CM

## 2014-10-01 MED ORDER — SUPREP BOWEL PREP KIT 17.5-3.13-1.6 GM/177ML PO SOLN: 1.0000 | Freq: Once | ORAL | Status: DC

## 2014-10-01 NOTE — Progress Notes (Signed)
No allergies to eggs or soy Only takes OTC diet aides No home oxygen No past problems with anesthesia  Refused emmi

## 2014-10-12 ENCOUNTER — Encounter: Payer: Self-pay | Admitting: Gastroenterology

## 2014-10-12 ENCOUNTER — Ambulatory Visit (AMBULATORY_SURGERY_CENTER): Payer: Commercial Managed Care - HMO | Admitting: Gastroenterology

## 2014-10-12 VITALS — BP 110/70 | HR 57 | Temp 96.6°F | Resp 17 | Ht 64.5 in | Wt 215.0 lb

## 2014-10-12 DIAGNOSIS — Z1211 Encounter for screening for malignant neoplasm of colon: Secondary | ICD-10-CM

## 2014-10-12 DIAGNOSIS — Z800 Family history of malignant neoplasm of digestive organs: Secondary | ICD-10-CM

## 2014-10-12 MED ORDER — SODIUM CHLORIDE 0.9 % IV SOLN: 500.0000 mL | INTRAVENOUS | Status: DC

## 2014-10-12 NOTE — Patient Instructions (Signed)
YOU HAD AN ENDOSCOPIC PROCEDURE TODAY AT THE [facility]:   Refer to the procedure report that was given to you for any specific questions about what was found during the examination.  If the procedure report does not answer your questions, please call your gastroenterologist to clarify.  If you requested that your care partner not be given the details of your procedure findings, then the procedure report has been included in a sealed envelope for you to review at your convenience later.  YOU SHOULD EXPECT: Some feelings of bloating in the abdomen. Passage of more gas than usual.  Walking can help get rid of the air that was put into your GI tract during the procedure and reduce the bloating. If you had a lower endoscopy (such as a colonoscopy or flexible sigmoidoscopy) you may notice spotting of blood in your stool or on the toilet paper. If you underwent a bowel prep for your procedure, you may not have a normal bowel movement for a few days.  Please Note:  You might notice some irritation and congestion in your nose or some drainage.  This is from the oxygen used during your procedure.  There is no need for concern and it should clear up in a day or so.  SYMPTOMS TO REPORT IMMEDIATELY:   Following lower endoscopy (colonoscopy or flexible sigmoidoscopy):  Excessive amounts of blood in the stool  Significant tenderness or worsening of abdominal pains  Swelling of the abdomen that is new, acute  Fever of 100F or higher   For urgent or emergent issues, a gastroenterologist can be reached at any hour by calling [phone].   DIET: Your first meal following the procedure should be a small meal and then it is ok to progress to your normal diet. Heavy or fried foods are harder to digest and may make you feel nauseous or bloated.  Likewise, meals heavy in dairy and vegetables can increase bloating.  Drink plenty of fluids but you should avoid alcoholic beverages for 24  hours.  ACTIVITY:  You should plan to take it easy for the rest of today and you should NOT DRIVE or use heavy machinery until tomorrow (because of the sedation medicines used during the test).    FOLLOW UP: Our staff will call the number listed on your records the next business day following your procedure to check on you and address any questions or concerns that you may have regarding the information given to you following your procedure. If we do not reach you, we will leave a message.  However, if you are feeling well and you are not experiencing any problems, there is no need to return our call.  We will assume that you have returned to your regular daily activities without incident.  If any biopsies were taken you will be contacted by phone or by letter within the next 1-3 weeks.  Please call us at [phone] if you have not heard about the biopsies in 3 weeks.    SIGNATURES/CONFIDENTIALITY: You and/or your care partner have signed paperwork which will be entered into your electronic medical record.  These signatures attest to the fact that that the information above on your After Visit Summary has been reviewed and is understood.  Full responsibility of the confidentiality of this discharge information lies with you and/or your care-partner. 

## 2014-10-12 NOTE — Progress Notes (Signed)
To recovery, report to Tyrell, RN, VSS. 

## 2014-10-12 NOTE — Op Note (Signed)
[facility]  [address] [place], [id number]   COLONOSCOPY PROCEDURE REPORT  PATIENT: Banks, Sierra  MR#: [id number] BIRTHDATE: March 10, 1950 , 63  yrs. old GENDER: female ENDOSCOPIST: Ladene Artist, MD, [facility] PROCEDURE DATE:  10/12/2014 PROCEDURE:   Colonoscopy, screening First Screening Colonoscopy - Avg.  risk and is 50 yrs.  old or older - No.  Prior Negative Screening - Now for repeat screening. Less than 10 yrs Prior Negative Screening - Now for repeat screening.  Above average risk  History of Adenoma - Now for follow-up colonoscopy & has been > or = to 3 yrs.  N/A  Polyps removed today? No Recommend repeat exam, <10 yrs? Yes high risk ASA CLASS:   Class II INDICATIONS:Screening for colonic neoplasia and FH Colon or Rectal Adenocarcinoma. MEDICATIONS: Monitored anesthesia care and Propofol 400 mg IV DESCRIPTION OF PROCEDURE:   After the risks benefits and alternatives of the procedure were thoroughly explained, informed consent was obtained.  The digital rectal exam revealed no abnormalities of the rectum.   The [id number]  endoscope was introduced through the anus and advanced to the cecum, which was identified by both the appendix and ileocecal valve. No adverse events experienced.   Limited by a tortuous and redundant colon. The quality of the prep was adequate (Suprep was used) after extensive rinsing and suctioning. The instrument was then slowly withdrawn as the colon was fully examined. Estimated blood loss is zero unless otherwise noted in this procedure report.    COLON FINDINGS: There was mild diverticulosis noted in the sigmoid colon.   The examination was otherwise normal.  Retroflexed views revealed no abnormalities. The time to cecum = 6.9 Withdrawal time = 9.5   The scope was withdrawn and the procedure completed. COMPLICATIONS: There were no immediate complications.  ENDOSCOPIC IMPRESSION: 1.   Mild diverticulosis in the sigmoid  colon 2.   The examination was otherwise normal  RECOMMENDATIONS: 1.  High fiber diet with liberal fluid intake. 2.  Repeat Colonoscopy in 5 years with a more extensive bowel prep.   eSigned:  Ladene Artist, MD, [facility] 10/12/2014 9:14 AM   cc: R.  Marcellus Scott, MD

## 2014-10-13 ENCOUNTER — Telehealth: Payer: Self-pay | Admitting: *Deleted

## 2014-10-13 NOTE — Telephone Encounter (Signed)
  Follow up Call-  Call back number 10/12/2014  Post procedure Call Back phone  # [phone]  Permission to leave phone message Yes     Patient questions:  This number has been disconnected.

## 2015-04-10 DIAGNOSIS — J111 Influenza due to unidentified influenza virus with other respiratory manifestations: Secondary | ICD-10-CM | POA: Diagnosis not present

## 2015-04-13 DIAGNOSIS — J111 Influenza due to unidentified influenza virus with other respiratory manifestations: Secondary | ICD-10-CM | POA: Diagnosis not present

## 2015-04-13 DIAGNOSIS — R05 Cough: Secondary | ICD-10-CM | POA: Diagnosis not present

## 2015-06-21 DIAGNOSIS — I119 Hypertensive heart disease without heart failure: Secondary | ICD-10-CM | POA: Diagnosis not present

## 2015-06-21 DIAGNOSIS — F329 Major depressive disorder, single episode, unspecified: Secondary | ICD-10-CM | POA: Diagnosis not present

## 2015-06-21 DIAGNOSIS — Z0001 Encounter for general adult medical examination with abnormal findings: Secondary | ICD-10-CM | POA: Diagnosis not present

## 2015-06-21 DIAGNOSIS — M722 Plantar fascial fibromatosis: Secondary | ICD-10-CM | POA: Diagnosis not present

## 2015-06-21 DIAGNOSIS — E669 Obesity, unspecified: Secondary | ICD-10-CM | POA: Diagnosis not present

## 2015-06-21 DIAGNOSIS — I10 Essential (primary) hypertension: Secondary | ICD-10-CM | POA: Diagnosis not present

## 2015-06-21 DIAGNOSIS — Z6838 Body mass index (BMI) 38.0-38.9, adult: Secondary | ICD-10-CM | POA: Diagnosis not present

## 2015-06-21 DIAGNOSIS — Z79899 Other long term (current) drug therapy: Secondary | ICD-10-CM | POA: Diagnosis not present

## 2015-06-21 DIAGNOSIS — D1721 Benign lipomatous neoplasm of skin and subcutaneous tissue of right arm: Secondary | ICD-10-CM | POA: Diagnosis not present

## 2015-06-21 DIAGNOSIS — Z23 Encounter for immunization: Secondary | ICD-10-CM | POA: Diagnosis not present

## 2015-06-21 DIAGNOSIS — J309 Allergic rhinitis, unspecified: Secondary | ICD-10-CM | POA: Diagnosis not present

## 2015-06-21 DIAGNOSIS — E7800 Pure hypercholesterolemia, unspecified: Secondary | ICD-10-CM | POA: Diagnosis not present

## 2015-07-28 DIAGNOSIS — E669 Obesity, unspecified: Secondary | ICD-10-CM | POA: Diagnosis not present

## 2015-07-28 DIAGNOSIS — F329 Major depressive disorder, single episode, unspecified: Secondary | ICD-10-CM | POA: Diagnosis not present

## 2015-07-28 DIAGNOSIS — Z6837 Body mass index (BMI) 37.0-37.9, adult: Secondary | ICD-10-CM | POA: Diagnosis not present

## 2015-07-28 DIAGNOSIS — I10 Essential (primary) hypertension: Secondary | ICD-10-CM | POA: Diagnosis not present

## 2015-08-06 DIAGNOSIS — Z1231 Encounter for screening mammogram for malignant neoplasm of breast: Secondary | ICD-10-CM | POA: Diagnosis not present

## 2015-08-06 DIAGNOSIS — Z803 Family history of malignant neoplasm of breast: Secondary | ICD-10-CM | POA: Diagnosis not present

## 2016-03-20 ENCOUNTER — Encounter (HOSPITAL_COMMUNITY): Payer: Self-pay

## 2016-03-20 ENCOUNTER — Emergency Department (HOSPITAL_COMMUNITY)
Admission: EM | Admit: 2016-03-20 | Discharge: 2016-03-20 | Disposition: A | Discharge status: Home / Self Care | Payer: Medicare Other | Attending: Emergency Medicine | Admitting: Emergency Medicine

## 2016-03-20 DIAGNOSIS — R109 Unspecified abdominal pain: Secondary | ICD-10-CM | POA: Diagnosis not present

## 2016-03-20 DIAGNOSIS — Z5321 Procedure and treatment not carried out due to patient leaving prior to being seen by health care provider: Secondary | ICD-10-CM | POA: Insufficient documentation

## 2016-03-20 LAB — CBC
HCT: 41.2 % (ref 36.0–46.0)
Hemoglobin: 13.6 g/dL (ref 12.0–15.0)
MCH: 28.3 pg (ref 26.0–34.0)
MCHC: 33 g/dL (ref 30.0–36.0)
MCV: 85.8 fL (ref 78.0–100.0)
Platelets: 312 10*3/uL (ref 150–400)
RBC: 4.8 MIL/uL (ref 3.87–5.11)
RDW: 14.3 % (ref 11.5–15.5)
WBC: 13 10*3/uL — ABNORMAL HIGH (ref 4.0–10.5)

## 2016-03-20 LAB — COMPREHENSIVE METABOLIC PANEL
ALT: 19 U/L (ref 14–54)
AST: 23 U/L (ref 15–41)
Albumin: 4.4 g/dL (ref 3.5–5.0)
Alkaline Phosphatase: 86 U/L (ref 38–126)
Anion gap: 10 (ref 5–15)
BUN: 21 mg/dL — ABNORMAL HIGH (ref 6–20)
CO2: 26 mmol/L (ref 22–32)
Calcium: 9.5 mg/dL (ref 8.9–10.3)
Chloride: 104 mmol/L (ref 101–111)
Creatinine, Ser: 1.47 mg/dL — ABNORMAL HIGH (ref 0.44–1.00)
GFR calc Af Amer: 42 mL/min — ABNORMAL LOW (ref >60)
GFR calc non Af Amer: 36 mL/min — ABNORMAL LOW (ref >60)
Glucose, Bld: 138 mg/dL — ABNORMAL HIGH (ref 65–99)
Potassium: 3.4 mmol/L — ABNORMAL LOW (ref 3.5–5.1)
Sodium: 140 mmol/L (ref 135–145)
Total Bilirubin: 0.4 mg/dL (ref 0.3–1.2)
Total Protein: 7.5 g/dL (ref 6.5–8.1)

## 2016-03-20 LAB — LIPASE, BLOOD: Lipase: 27 U/L (ref 11–51)

## 2016-03-20 NOTE — ED Triage Notes (Signed)
Pt presents with c/o abdominal pain and flank pain. Pt reports she is only have pain at the left flank area. Pt reports nausea but no vomiting or diarrhea. Pt denies any hx of kidney stones.

## 2016-03-20 NOTE — ED Triage Notes (Signed)
Pt told registration she was scared she was going to get the flu by waiting in the lobby and handed them her stickers.

## 2016-03-21 ENCOUNTER — Other Ambulatory Visit: Payer: Self-pay | Admitting: Internal Medicine

## 2016-03-21 ENCOUNTER — Ambulatory Visit
Admission: RE | Admit: 2016-03-21 | Discharge: 2016-03-21 | Disposition: A | Discharge status: Home / Self Care | Payer: Commercial Managed Care - HMO | Source: Ambulatory Visit | Attending: Internal Medicine | Admitting: Internal Medicine

## 2016-03-21 DIAGNOSIS — R1010 Upper abdominal pain, unspecified: Secondary | ICD-10-CM | POA: Diagnosis not present

## 2016-03-21 DIAGNOSIS — N289 Disorder of kidney and ureter, unspecified: Secondary | ICD-10-CM | POA: Diagnosis not present

## 2016-03-21 DIAGNOSIS — N1330 Unspecified hydronephrosis: Secondary | ICD-10-CM | POA: Diagnosis not present

## 2016-03-21 DIAGNOSIS — N132 Hydronephrosis with renal and ureteral calculous obstruction: Secondary | ICD-10-CM | POA: Diagnosis not present

## 2016-03-21 DIAGNOSIS — R109 Unspecified abdominal pain: Secondary | ICD-10-CM

## 2016-03-21 DIAGNOSIS — N200 Calculus of kidney: Secondary | ICD-10-CM | POA: Diagnosis not present

## 2016-03-21 DIAGNOSIS — R10A0 Flank pain, unspecified side: Secondary | ICD-10-CM

## 2016-03-21 DIAGNOSIS — R350 Frequency of micturition: Secondary | ICD-10-CM | POA: Diagnosis not present

## 2016-03-22 DIAGNOSIS — R109 Unspecified abdominal pain: Secondary | ICD-10-CM | POA: Diagnosis not present

## 2016-03-22 DIAGNOSIS — R350 Frequency of micturition: Secondary | ICD-10-CM | POA: Diagnosis not present

## 2016-03-22 DIAGNOSIS — N201 Calculus of ureter: Secondary | ICD-10-CM | POA: Diagnosis not present

## 2016-03-27 DIAGNOSIS — D3001 Benign neoplasm of right kidney: Secondary | ICD-10-CM | POA: Diagnosis not present

## 2016-03-27 DIAGNOSIS — N201 Calculus of ureter: Secondary | ICD-10-CM | POA: Diagnosis not present

## 2016-03-28 ENCOUNTER — Other Ambulatory Visit: Payer: Self-pay | Admitting: Urology

## 2016-04-05 ENCOUNTER — Encounter (HOSPITAL_BASED_OUTPATIENT_CLINIC_OR_DEPARTMENT_OTHER): Payer: Self-pay | Admitting: *Deleted

## 2016-04-05 DIAGNOSIS — Z87442 Personal history of urinary calculi: Secondary | ICD-10-CM

## 2016-04-05 HISTORY — DX: Personal history of urinary calculi: Z87.442

## 2016-04-05 NOTE — Progress Notes (Signed)
To [facility] at [place], Ekg on arrival.Instructed Npo after Mn-will take topamax,amlodipine,cozaar,atenolol with small water in am.

## 2016-04-11 DIAGNOSIS — N201 Calculus of ureter: Secondary | ICD-10-CM | POA: Diagnosis not present

## 2016-04-13 ENCOUNTER — Ambulatory Visit (HOSPITAL_BASED_OUTPATIENT_CLINIC_OR_DEPARTMENT_OTHER): Admit: 2016-04-13 | Payer: Self-pay | Admitting: Urology

## 2016-04-13 HISTORY — DX: Personal history of urinary calculi: Z87.442

## 2016-04-13 SURGERY — URETEROSCOPY
Anesthesia: Choice | Laterality: Left

## 2016-04-20 DIAGNOSIS — Z124 Encounter for screening for malignant neoplasm of cervix: Secondary | ICD-10-CM | POA: Diagnosis not present

## 2016-06-23 ENCOUNTER — Other Ambulatory Visit: Payer: Self-pay | Admitting: Urology

## 2016-06-23 DIAGNOSIS — D3001 Benign neoplasm of right kidney: Secondary | ICD-10-CM

## 2016-08-28 ENCOUNTER — Ambulatory Visit (HOSPITAL_COMMUNITY)
Admission: RE | Admit: 2016-08-28 | Discharge: 2016-08-28 | Disposition: A | Discharge status: Home / Self Care | Payer: Medicare Other | Source: Ambulatory Visit | Attending: Urology | Admitting: Urology

## 2016-08-28 DIAGNOSIS — K8020 Calculus of gallbladder without cholecystitis without obstruction: Secondary | ICD-10-CM | POA: Diagnosis not present

## 2016-08-28 DIAGNOSIS — N281 Cyst of kidney, acquired: Secondary | ICD-10-CM | POA: Diagnosis not present

## 2016-08-28 DIAGNOSIS — D3001 Benign neoplasm of right kidney: Secondary | ICD-10-CM

## 2016-08-28 DIAGNOSIS — D1771 Benign lipomatous neoplasm of kidney: Secondary | ICD-10-CM | POA: Insufficient documentation

## 2016-08-28 DIAGNOSIS — K760 Fatty (change of) liver, not elsewhere classified: Secondary | ICD-10-CM | POA: Insufficient documentation

## 2016-08-28 LAB — POCT I-STAT CREATININE: Creatinine, Ser: 1 mg/dL (ref 0.44–1.00)

## 2016-08-28 MED ORDER — GADOBENATE DIMEGLUMINE 529 MG/ML IV SOLN
20.0000 mL | Freq: Once | INTRAVENOUS | Status: AC | PRN
  Administered 2016-08-28: 19 mL via INTRAVENOUS

## 2016-08-29 DIAGNOSIS — Z803 Family history of malignant neoplasm of breast: Secondary | ICD-10-CM | POA: Diagnosis not present

## 2016-08-29 DIAGNOSIS — Z1231 Encounter for screening mammogram for malignant neoplasm of breast: Secondary | ICD-10-CM | POA: Diagnosis not present

## 2016-09-04 DIAGNOSIS — D3001 Benign neoplasm of right kidney: Secondary | ICD-10-CM | POA: Diagnosis not present

## 2016-09-19 DIAGNOSIS — D3001 Benign neoplasm of right kidney: Secondary | ICD-10-CM | POA: Diagnosis not present

## 2016-09-28 ENCOUNTER — Other Ambulatory Visit: Payer: Self-pay | Admitting: Urology

## 2016-09-28 DIAGNOSIS — D1771 Benign lipomatous neoplasm of kidney: Secondary | ICD-10-CM

## 2016-10-12 ENCOUNTER — Ambulatory Visit
Admission: RE | Admit: 2016-10-12 | Discharge: 2016-10-12 | Disposition: A | Discharge status: Home / Self Care | Payer: Medicare Other | Source: Ambulatory Visit | Attending: Urology | Admitting: Urology

## 2016-10-12 ENCOUNTER — Other Ambulatory Visit: Payer: Self-pay | Admitting: Radiology

## 2016-10-12 ENCOUNTER — Other Ambulatory Visit (HOSPITAL_COMMUNITY): Payer: Self-pay | Admitting: Interventional Radiology

## 2016-10-12 DIAGNOSIS — D3001 Benign neoplasm of right kidney: Secondary | ICD-10-CM | POA: Diagnosis not present

## 2016-10-12 DIAGNOSIS — D1771 Benign lipomatous neoplasm of kidney: Secondary | ICD-10-CM

## 2016-10-12 HISTORY — PX: IR RADIOLOGIST EVAL & MGMT: IMG5224

## 2016-10-12 NOTE — Consult Note (Signed)
Chief Complaint: Renal AML  Referring Physician(s): Borden,Lester  History of Present Illness: Sierra Banks is a 66 y.o. 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    Patient was discussed recently at Urology conference and has been referred by Dr. Alinda Money to the IR clinic for discussion of potential percutaneous treatment options.    Patient is accompanied by her husband though serves as her own historian.  The patient is asymptomatic in regards to the right-sided renal lesion. Specifically, no flank pain. No hematuria. No unintentional weight loss. No change in appetite or energy level.  Past Medical History:  Diagnosis Date  . ABSCESS, BREAST, RIGHT 10/04/2009  . DEPRESSION 08/28/2006  . Dysrhythmia    HEART PALPITATIONS   . GERD (gastroesophageal reflux disease)    OCC TUMS   . Hepatitis    JAUNDICE AGE 51  . 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 History of kidney stones 04/05/2016  . HYPERLIPIDEMIA 12/23/2007  . HYPERTENSION 08/28/2006  . Overweight(278.02) 01/12/2009    Past Surgical History:  Procedure Laterality Date  . CARPAL TUNNEL RELEASE     LEFT  2002  . DILATION AND CURETTAGE OF UTERUS     2011  . JOINT REPLACEMENT Left   . MASS EXCISION Right 08/21/2014   Procedure: RIGHT FOREARM MASS EXCISION WITH REPAIR AND RECONSTRUCTION;  Surgeon: Roseanne Kaufman, MD;  Location: [facility];  Service: Orthopedics;  Laterality: Right;  . TONSILLECTOMY     1969  . TOTAL KNEE ARTHROPLASTY  05/08/2011   Procedure: TOTAL KNEE  ARTHROPLASTY;  Surgeon: Rudean Haskell, MD;  Location: [facility];  Service: Orthopedics;  Laterality: Left;  . TUBAL LIGATION     1980    Allergies: Patient has no known allergies.  Medications: Prior to Admission medications   Medication Sig Start Date End Date Taking? Authorizing Provider  amLODipine (NORVASC) 10 MG tablet TAKE 1 TABLET BY MOUTH EVERY MORNING Patient taking differently: take 1/2 tab every morning 04/11/12  Yes Dorena Cookey, MD  atenolol (TENORMIN) 100 MG tablet TAKE 1 TABLET EVERY MORNING 03/08/13  Yes Dorena Cookey, MD  buPROPion [facility]) 75 MG tablet TAKE 1 TABLET AT BEDTIME 09/20/12  Yes Dorena Cookey, MD  cloNIDine (CATAPRES) 0.1 MG tablet Take 0.1 mg by mouth at bedtime.    Yes [provider]  furosemide (LASIX) 40 MG tablet TAKE 1 TABLET BY MOUTH EVERY MORNING 09/20/12  Yes Dorena Cookey, MD  lisinopril (PRINIVIL,ZESTRIL) 40 MG tablet lisinopril 40 mg tablet   Yes [provider]  losartan (COZAAR) 100 MG tablet Take 100 mg by mouth daily.   Yes [provider]  Multiple Vitamin (MULITIVITAMIN WITH MINERALS) TABS Take 1 tablet by mouth daily.   Yes [provider]  simvastatin (ZOCOR) 40 MG tablet Take 40 mg by mouth at bedtime. 03/28/11  Yes Dorena Cookey, MD  topiramate (TOPAMAX) 25 MG tablet Take 25 mg by mouth 2 (two) times daily.   Yes [provider]  venlafaxine XR (EFFEXOR-XR) 150 MG 24 hr capsule TAKE ONE CAPSULE BY MOUTH TWICE A  DAY 06/18/12  Yes Dorena Cookey, MD     Family History  Problem Relation Age of Onset  . Colon polyps Sister   . Colon cancer Maternal Uncle   . Colon cancer Maternal Grandfather     Social History   Social History  . Marital status: Married    Spouse name: N/A  . Number of children: N/A  . Years of education: N/A   Social History Main Topics  . Smoking status: Never Smoker  . Smokeless tobacco: Never Used  . Alcohol use No  . Drug use: No  . Sexual activity: Not  on file   Other Topics Concern  . Not on file   Social History Narrative  . No narrative on file    ECOG Status: 0 - Asymptomatic  Review of Systems: A 12 point ROS discussed and pertinent positives are indicated in the HPI above.  All other systems are negative.  Review of Systems  Vital Signs: BP (!) 145/81 (BP Location: Left Arm, Patient Position: Sitting, Cuff Size: Normal)   Pulse 62   Temp (!) 97.1 F (36.2 C)   Resp 14   Ht 5\' 2"  (1.575 m)   Wt 200 lb (90.7 kg)   SpO2 96%   BMI 36.58 kg/m   Physical Exam  Mallampati Score:     Imaging: No results found.  Labs:  CBC:  Recent Labs  03/20/16 2223  WBC 13.0*  HGB 13.6  HCT 41.2  PLT 312    COAGS: No results for input(s): INR, APTT in the last 8760 hours.  BMP:  Recent Labs  03/20/16 2223 08/28/16 0919  NA 140  --   K 3.4*  --   CL 104  --   CO2 26  --   GLUCOSE 138*  --   BUN 21*  --   CALCIUM 9.5  --   CREATININE 1.47* 1.00  GFRNONAA 36*  --   GFRAA 42*  --     LIVER FUNCTION TESTS:  Recent Labs  03/20/16 2223  BILITOT 0.4  AST 23  ALT 19  ALKPHOS 86  PROT 7.5  ALBUMIN 4.4    TUMOR MARKERS: No results for input(s): AFPTM, CEA, CA199, CHROMGRNA in the last 8760 hours.  Assessment and Plan:  Sierra Banks is a 66 y.o. 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    Selected images from abdominal MRI performed 08/28/2016, abdominal CT performed 03/21/2016 and remote lumbar spine MRI performed 03/30/2011 were reviewed in detail with the patient  and the patient's husband.  Most recent abdominal MRI demonstrates an approximately 7.5 cm predominantly fat containing lesion involving the right kidney. This lesion appears similar in hindsight to remote lumbar spine MRI performed 01/2012 though was incompletely imaged and suboptimally evaluated on that examination.  I explained that while this lesion is benign (supported by relative stability since December 2013), given the size of the mass, there is a risk factor for spontaneous or posttraumatic hemorrhage however the majority of this lesion is fat with a very small angiogenic component about the mid perirenal aspect of the  mass.  As such, potential treatment options could include the following:  - Continued observation with follow-up MRI in 3-6 months - I explained that this lesion has been present since (at least) 2013 and could quite possibly never cause her any symptoms, with the major concern again being a spontaneous hemorrhage though this is risk is lessened by the proportionally small angiogenic component.  - Definitive surgical resection - considered the gold standard for lesions of this size.  - Diagnostic angiogram with potential selective embolization - Prolonged conversations were held with the patient regarding the benefits and risks (including but not limited to postprocedural pain, hematuria, worsening renal function, bleeding/vessel injury, infection and nontarget embolization) of renal angiogram and embolization.  I explained that embolization would be performed with the hope of further reducing the angiogenic component of the lesion to further reduce the potential risk for spontaneous hemorrhage. I explained that following the embolization, she would be performed with surveillance scans to ensure an adequate treatment result.  Following the above detailed conversation, the patient wishes to pursue a diagnostic renal arteriogram with potential percutaneous embolization.  As  such, I will first obtain a CTA of the abdomen and pelvis to better delineate the vascular anatomy to the large AML.  Ultimately, the embolization will be performed at [facility]. The procedure will entail an overnight admission for PCA usage and continued observation.  The patient was encouraged to call the interventional radiology clinic with any interval questions or concerns.  Thank you for this interesting consult.  I greatly enjoyed meeting Sierra Banks and look forward to participating in their care.  A copy of this report was sent to the requesting provider on this date.  Electronically Signed: Sandi Mariscal 10/12/2016, 5:13 PM   I spent a total of 40 Minutes in face to face in clinical consultation, greater than 50% of which was counseling/coordinating care for renal AML.

## 2016-10-13 ENCOUNTER — Other Ambulatory Visit: Payer: Self-pay | Admitting: Radiology

## 2016-10-13 DIAGNOSIS — Z79899 Other long term (current) drug therapy: Secondary | ICD-10-CM | POA: Diagnosis not present

## 2016-10-13 DIAGNOSIS — D1721 Benign lipomatous neoplasm of skin and subcutaneous tissue of right arm: Secondary | ICD-10-CM | POA: Diagnosis not present

## 2016-10-13 DIAGNOSIS — E7800 Pure hypercholesterolemia, unspecified: Secondary | ICD-10-CM | POA: Diagnosis not present

## 2016-10-13 DIAGNOSIS — I119 Hypertensive heart disease without heart failure: Secondary | ICD-10-CM | POA: Diagnosis not present

## 2016-10-13 DIAGNOSIS — N2889 Other specified disorders of kidney and ureter: Secondary | ICD-10-CM | POA: Diagnosis not present

## 2016-10-13 DIAGNOSIS — F322 Major depressive disorder, single episode, severe without psychotic features: Secondary | ICD-10-CM | POA: Diagnosis not present

## 2016-10-13 DIAGNOSIS — Z0001 Encounter for general adult medical examination with abnormal findings: Secondary | ICD-10-CM | POA: Diagnosis not present

## 2016-10-13 DIAGNOSIS — N1330 Unspecified hydronephrosis: Secondary | ICD-10-CM | POA: Diagnosis not present

## 2016-10-13 DIAGNOSIS — D3001 Benign neoplasm of right kidney: Secondary | ICD-10-CM

## 2016-10-13 DIAGNOSIS — D1771 Benign lipomatous neoplasm of kidney: Secondary | ICD-10-CM

## 2016-10-13 DIAGNOSIS — J309 Allergic rhinitis, unspecified: Secondary | ICD-10-CM | POA: Diagnosis not present

## 2016-10-13 DIAGNOSIS — K8020 Calculus of gallbladder without cholecystitis without obstruction: Secondary | ICD-10-CM | POA: Diagnosis not present

## 2016-10-17 ENCOUNTER — Ambulatory Visit (HOSPITAL_COMMUNITY)
Admission: RE | Admit: 2016-10-17 | Discharge: 2016-10-17 | Disposition: A | Discharge status: Home / Self Care | Payer: Medicare Other | Source: Ambulatory Visit | Attending: Interventional Radiology | Admitting: Interventional Radiology

## 2016-10-17 DIAGNOSIS — D1771 Benign lipomatous neoplasm of kidney: Secondary | ICD-10-CM

## 2016-10-17 DIAGNOSIS — Z86018 Personal history of other benign neoplasm: Secondary | ICD-10-CM | POA: Diagnosis not present

## 2016-10-17 DIAGNOSIS — D3001 Benign neoplasm of right kidney: Secondary | ICD-10-CM | POA: Insufficient documentation

## 2016-10-17 DIAGNOSIS — C9200 Acute myeloblastic leukemia, not having achieved remission: Secondary | ICD-10-CM | POA: Insufficient documentation

## 2016-10-17 DIAGNOSIS — I700 Atherosclerosis of aorta: Secondary | ICD-10-CM | POA: Diagnosis not present

## 2016-10-17 LAB — POCT I-STAT CREATININE: Creatinine, Ser: 1.2 mg/dL — ABNORMAL HIGH (ref 0.44–1.00)

## 2016-10-17 MED ORDER — IOPAMIDOL (ISOVUE-370) INJECTION 76%
INTRAVENOUS | Status: AC
  Administered 2016-10-17: 100 mL via INTRAVENOUS
  Filled 2016-10-17: qty 100

## 2016-10-17 MED ORDER — IOPAMIDOL (ISOVUE-370) INJECTION 76%
100.0000 mL | Freq: Once | INTRAVENOUS | Status: AC | PRN
  Administered 2016-10-17: 100 mL via INTRAVENOUS

## 2016-10-18 ENCOUNTER — Other Ambulatory Visit (HOSPITAL_COMMUNITY): Payer: Self-pay | Admitting: Interventional Radiology

## 2016-10-18 DIAGNOSIS — D3001 Benign neoplasm of right kidney: Secondary | ICD-10-CM

## 2016-10-18 DIAGNOSIS — D1771 Benign lipomatous neoplasm of kidney: Secondary | ICD-10-CM

## 2016-10-19 ENCOUNTER — Other Ambulatory Visit (HOSPITAL_COMMUNITY): Payer: Medicare Other

## 2016-10-19 ENCOUNTER — Other Ambulatory Visit: Payer: Self-pay | Admitting: Radiology

## 2016-10-20 ENCOUNTER — Other Ambulatory Visit: Payer: Self-pay | Admitting: General Surgery

## 2016-10-22 ENCOUNTER — Other Ambulatory Visit: Payer: Self-pay | Admitting: Radiology

## 2016-10-23 ENCOUNTER — Other Ambulatory Visit: Payer: Self-pay | Admitting: Radiology

## 2016-10-24 ENCOUNTER — Ambulatory Visit (HOSPITAL_COMMUNITY)
Admission: RE | Admit: 2016-10-24 | Discharge: 2016-10-24 | Disposition: A | Discharge status: Home / Self Care | Payer: Medicare Other | Source: Ambulatory Visit | Attending: Interventional Radiology | Admitting: Interventional Radiology

## 2016-10-24 ENCOUNTER — Observation Stay (HOSPITAL_COMMUNITY)
Admission: RE | Admit: 2016-10-24 | Discharge: 2016-10-25 | Disposition: A | Discharge status: Home / Self Care | Payer: Medicare Other | Source: Ambulatory Visit | Attending: Internal Medicine | Admitting: Internal Medicine

## 2016-10-24 ENCOUNTER — Encounter (HOSPITAL_COMMUNITY): Payer: Self-pay

## 2016-10-24 DIAGNOSIS — D1771 Benign lipomatous neoplasm of kidney: Secondary | ICD-10-CM | POA: Diagnosis not present

## 2016-10-24 DIAGNOSIS — C9200 Acute myeloblastic leukemia, not having achieved remission: Secondary | ICD-10-CM | POA: Diagnosis not present

## 2016-10-24 DIAGNOSIS — E785 Hyperlipidemia, unspecified: Secondary | ICD-10-CM | POA: Insufficient documentation

## 2016-10-24 DIAGNOSIS — I517 Cardiomegaly: Secondary | ICD-10-CM | POA: Diagnosis not present

## 2016-10-24 DIAGNOSIS — Z79899 Other long term (current) drug therapy: Secondary | ICD-10-CM | POA: Diagnosis not present

## 2016-10-24 DIAGNOSIS — D3001 Benign neoplasm of right kidney: Secondary | ICD-10-CM | POA: Diagnosis not present

## 2016-10-24 DIAGNOSIS — I119 Hypertensive heart disease without heart failure: Secondary | ICD-10-CM | POA: Insufficient documentation

## 2016-10-24 DIAGNOSIS — R001 Bradycardia, unspecified: Secondary | ICD-10-CM | POA: Diagnosis present

## 2016-10-24 DIAGNOSIS — Z96652 Presence of left artificial knee joint: Secondary | ICD-10-CM | POA: Diagnosis not present

## 2016-10-24 HISTORY — PX: IR EMBO TUMOR ORGAN ISCHEMIA INFARCT INC GUIDE ROADMAPPING: IMG5449

## 2016-10-24 HISTORY — PX: IR US GUIDE VASC ACCESS RIGHT: IMG2390

## 2016-10-24 HISTORY — PX: IR RENAL SUPRASEL UNI S&I MOD SED: IMG655

## 2016-10-24 LAB — CBC
HCT: 40.1 % (ref 36.0–46.0)
Hemoglobin: 13.5 g/dL (ref 12.0–15.0)
MCH: 28.2 pg (ref 26.0–34.0)
MCHC: 33.7 g/dL (ref 30.0–36.0)
MCV: 83.7 fL (ref 78.0–100.0)
Platelets: 321 10*3/uL (ref 150–400)
RBC: 4.79 MIL/uL (ref 3.87–5.11)
RDW: 14.3 % (ref 11.5–15.5)
WBC: 9 10*3/uL (ref 4.0–10.5)

## 2016-10-24 LAB — BASIC METABOLIC PANEL
Anion gap: 11 (ref 5–15)
BUN: 14 mg/dL (ref 6–20)
CO2: 25 mmol/L (ref 22–32)
Calcium: 9.5 mg/dL (ref 8.9–10.3)
Chloride: 104 mmol/L (ref 101–111)
Creatinine, Ser: 0.98 mg/dL (ref 0.44–1.00)
GFR calc Af Amer: >60 mL/min (ref >60)
GFR calc non Af Amer: 59 mL/min — ABNORMAL LOW (ref >60)
Glucose, Bld: 95 mg/dL (ref 65–99)
Potassium: 3.4 mmol/L — ABNORMAL LOW (ref 3.5–5.1)
Sodium: 140 mmol/L (ref 135–145)

## 2016-10-24 LAB — PROTIME-INR
INR: 0.91
Prothrombin Time: 12.1 seconds (ref 11.4–15.2)

## 2016-10-24 LAB — APTT: aPTT: 30 seconds (ref 24–36)

## 2016-10-24 LAB — TROPONIN I: Troponin I: <0.03 ng/mL (ref <0.03)

## 2016-10-24 LAB — TSH: TSH: 11.999 u[IU]/mL — ABNORMAL HIGH (ref 0.350–4.500)

## 2016-10-24 MED ORDER — CIPROFLOXACIN IN D5W 400 MG/200ML IV SOLN
400.0000 mg | Freq: Once | INTRAVENOUS | Status: AC
  Administered 2016-10-24: 400 mg via INTRAVENOUS

## 2016-10-24 MED ORDER — IOPAMIDOL (ISOVUE-300) INJECTION 61%
INTRAVENOUS | Status: AC
  Administered 2016-10-24: 30 mL via INTRAVENOUS
  Filled 2016-10-24: qty 100

## 2016-10-24 MED ORDER — SIMVASTATIN 40 MG PO TABS
40.0000 mg | ORAL_TABLET | Freq: Every day | ORAL | Status: DC
  Administered 2016-10-24: 40 mg via ORAL
  Filled 2016-10-24: qty 1

## 2016-10-24 MED ORDER — CLONIDINE HCL 0.1 MG PO TABS
0.1000 mg | ORAL_TABLET | Freq: Every day | ORAL | Status: DC
  Administered 2016-10-24: 0.1 mg via ORAL
  Filled 2016-10-24: qty 1

## 2016-10-24 MED ORDER — AMLODIPINE BESYLATE 10 MG PO TABS
10.0000 mg | ORAL_TABLET | Freq: Every morning | ORAL | Status: DC
  Administered 2016-10-25: 10 mg via ORAL
  Filled 2016-10-24: qty 1

## 2016-10-24 MED ORDER — ONDANSETRON HCL 4 MG/2ML IJ SOLN
4.0000 mg | Freq: Four times a day (QID) | INTRAMUSCULAR | Status: DC | PRN
  Administered 2016-10-24: 4 mg via INTRAVENOUS
  Filled 2016-10-24: qty 2

## 2016-10-24 MED ORDER — FENTANYL CITRATE (PF) 100 MCG/2ML IJ SOLN
INTRAMUSCULAR | Status: AC | PRN
  Administered 2016-10-24 (×4): 50 ug via INTRAVENOUS

## 2016-10-24 MED ORDER — LIDOCAINE-EPINEPHRINE (PF) 2 %-1:200000 IJ SOLN
INTRAMUSCULAR | Status: AC
  Filled 2016-10-24: qty 20

## 2016-10-24 MED ORDER — CIPROFLOXACIN IN D5W 400 MG/200ML IV SOLN
INTRAVENOUS | Status: AC
  Filled 2016-10-24: qty 200

## 2016-10-24 MED ORDER — HYDROMORPHONE 1 MG/ML IV SOLN
INTRAVENOUS | Status: DC
  Administered 2016-10-24: 16:00:00 via INTRAVENOUS
  Administered 2016-10-24: 0.9 mg via INTRAVENOUS
  Administered 2016-10-24 (×2): 0.6 mg via INTRAVENOUS
  Administered 2016-10-25: 1.2 mg via INTRAVENOUS
  Filled 2016-10-24: qty 25

## 2016-10-24 MED ORDER — LIDOCAINE-EPINEPHRINE (PF) 2 %-1:200000 IJ SOLN
INTRAMUSCULAR | Status: AC | PRN
  Administered 2016-10-24: 10 mL

## 2016-10-24 MED ORDER — LOSARTAN POTASSIUM 50 MG PO TABS
100.0000 mg | ORAL_TABLET | Freq: Every day | ORAL | Status: DC
  Administered 2016-10-25: 100 mg via ORAL
  Filled 2016-10-24: qty 2

## 2016-10-24 MED ORDER — FUROSEMIDE 40 MG PO TABS
40.0000 mg | ORAL_TABLET | Freq: Every morning | ORAL | Status: DC
  Administered 2016-10-25: 40 mg via ORAL
  Filled 2016-10-24: qty 1

## 2016-10-24 MED ORDER — BUPROPION HCL 75 MG PO TABS
75.0000 mg | ORAL_TABLET | Freq: Every day | ORAL | Status: DC
  Administered 2016-10-24: 75 mg via ORAL
  Filled 2016-10-24: qty 1

## 2016-10-24 MED ORDER — MIDAZOLAM HCL 2 MG/2ML IJ SOLN
INTRAMUSCULAR | Status: AC | PRN
  Administered 2016-10-24 (×2): 1 mg via INTRAVENOUS

## 2016-10-24 MED ORDER — MIDAZOLAM HCL 2 MG/2ML IJ SOLN
INTRAMUSCULAR | Status: AC
  Filled 2016-10-24: qty 6

## 2016-10-24 MED ORDER — FENTANYL CITRATE (PF) 100 MCG/2ML IJ SOLN
INTRAMUSCULAR | Status: AC
  Filled 2016-10-24: qty 6

## 2016-10-24 MED ORDER — ATENOLOL 50 MG PO TABS: 100.0000 mg | ORAL_TABLET | Freq: Every day | ORAL | Status: DC

## 2016-10-24 MED ORDER — ADULT MULTIVITAMIN W/MINERALS CH: 1.0000 | ORAL_TABLET | Freq: Every day | ORAL | Status: DC

## 2016-10-24 MED ORDER — DIPHENHYDRAMINE HCL 50 MG/ML IJ SOLN: 12.5000 mg | Freq: Four times a day (QID) | INTRAMUSCULAR | Status: DC | PRN

## 2016-10-24 MED ORDER — IOPAMIDOL (ISOVUE-300) INJECTION 61%
100.0000 mL | Freq: Once | INTRAVENOUS | Status: AC | PRN
  Administered 2016-10-24: 30 mL via INTRAVENOUS

## 2016-10-24 MED ORDER — CIPROFLOXACIN IN D5W 400 MG/200ML IV SOLN: 400.0000 mg | Freq: Once | INTRAVENOUS | Status: DC

## 2016-10-24 MED ORDER — VENLAFAXINE HCL ER 150 MG PO CP24
150.0000 mg | ORAL_CAPSULE | Freq: Two times a day (BID) | ORAL | Status: DC
  Administered 2016-10-24: 150 mg via ORAL
  Filled 2016-10-24 (×2): qty 1

## 2016-10-24 MED ORDER — DIPHENHYDRAMINE HCL 12.5 MG/5ML PO ELIX: 12.5000 mg | ORAL_SOLUTION | Freq: Four times a day (QID) | ORAL | Status: DC | PRN

## 2016-10-24 MED ORDER — SODIUM CHLORIDE 0.9 % IV SOLN
INTRAVENOUS | Status: AC
  Administered 2016-10-24: 13:00:00 via INTRAVENOUS

## 2016-10-24 MED ORDER — NALOXONE HCL 0.4 MG/ML IJ SOLN: 0.4000 mg | INTRAMUSCULAR | Status: DC | PRN

## 2016-10-24 MED ORDER — ATENOLOL 100 MG PO TABS: 100.0000 mg | ORAL_TABLET | Freq: Every morning | ORAL | Status: DC

## 2016-10-24 MED ORDER — TOPIRAMATE 25 MG PO TABS
25.0000 mg | ORAL_TABLET | Freq: Two times a day (BID) | ORAL | Status: DC
  Administered 2016-10-24 – 2016-10-25 (×2): 25 mg via ORAL
  Filled 2016-10-24 (×2): qty 1

## 2016-10-24 MED ORDER — HYDRALAZINE HCL 20 MG/ML IJ SOLN
10.0000 mg | INTRAMUSCULAR | Status: DC | PRN
  Administered 2016-10-24: 10 mg via INTRAVENOUS
  Filled 2016-10-24: qty 1

## 2016-10-24 MED ORDER — LISINOPRIL 20 MG PO TABS
40.0000 mg | ORAL_TABLET | Freq: Every day | ORAL | Status: DC
  Administered 2016-10-25: 40 mg via ORAL
  Filled 2016-10-24: qty 2

## 2016-10-24 MED ORDER — SODIUM CHLORIDE 0.9% FLUSH: 9.0000 mL | INTRAVENOUS | Status: DC | PRN

## 2016-10-24 NOTE — Sedation Documentation (Signed)
Patient denies pain and is resting comfortably.  

## 2016-10-24 NOTE — Discharge Instructions (Signed)
Moderate Conscious Sedation, Adult, Care After  These instructions provide you with information about caring for yourself after your procedure. Your health care provider may also give you more specific instructions. Your treatment has been planned according to current medical practices, but problems sometimes occur. Call your health care provider if you have any problems or questions after your procedure.  What can I expect after the procedure?  After your procedure, it is common:   To feel sleepy for several hours.   To feel clumsy and have poor balance for several hours.   To have poor judgment for several hours.   To vomit if you eat too soon.    Follow these instructions at home:  For at least 24 hours after the procedure:     Do not:  ? Participate in activities where you could fall or become injured.  ? Drive.  ? Use heavy machinery.  ? Drink alcohol.  ? Take sleeping pills or medicines that cause drowsiness.  ? Make important decisions or sign legal documents.  ? Take care of children on your own.   Rest.  Eating and drinking   Follow the diet recommended by your health care provider.   If you vomit:  ? Drink water, juice, or soup when you can drink without vomiting.  ? Make sure you have little or no nausea before eating solid foods.  General instructions   Have a responsible adult stay with you until you are awake and alert.   Take over-the-counter and prescription medicines only as told by your health care provider.   If you smoke, do not smoke without supervision.   Keep all follow-up visits as told by your health care provider. This is important.  Contact a health care provider if:   You keep feeling nauseous or you keep vomiting.   You feel light-headed.   You develop a rash.   You have a fever.  Get help right away if:   You have trouble breathing.  This information is not intended to replace advice given to you by your health care provider. Make sure you discuss any questions you have  with your health care provider.  Document Released: 11/20/2012 Document Revised: 07/05/2015 Document Reviewed: 05/22/2015  [employer] Interactive Patient Education  2018 [employer]

## 2016-10-24 NOTE — Consult Note (Signed)
Medical Consultation   SHARNISE BLOUGH  XNT:[id number]  DOB: 12/02/1950  DOA: 10/24/2016  PCP: Josetta Huddle, MD   Outpatient Specialists: Dr. Alinda Money, urology  Requesting physician: Dr. Pascal Lux, IR   Reason for consultation: Bradycardia   History of Present Illness: Sierra Banks is an 66 y.o. 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 The procedure went very well with no immediate complication, but nursing staff raised concern for bradycardia. Patient was noted to have heart rate in the 30s to 40s with slightly elevated blood pressure and otherwise stable vitals.  She denied any chest pain, palpitations, headache, change in vision or hearing, or focal numbness or weakness. She denies any confusion. She has not been told that she has bradycardia before. She denies any history of thyroid disease or heart disease. She takes atenolol 100 mg every morning. Denies nausea or vomiting.  Review of Systems:  ROS As per HPI otherwise 10 point review of systems negative.   Past Medical History: Past Medical History:  Diagnosis Date  . ABSCESS, BREAST, RIGHT 10/04/2009  . DEPRESSION 08/28/2006  . Dysrhythmia    HEART PALPITATIONS   . GERD (gastroesophageal reflux disease)    OCC TUMS   . Hepatitis    JAUNDICE AGE 70  . 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 History of kidney stones 04/05/2016  . HYPERLIPIDEMIA 12/23/2007  . HYPERTENSION 08/28/2006  . Overweight(278.02) 01/12/2009    Past Surgical History: Past Surgical History:  Procedure Laterality Date  . CARPAL TUNNEL RELEASE     LEFT  2002  . DILATION AND CURETTAGE OF UTERUS     2011  . fatty tumor removed from right arm  2016  . IR EMBO TUMOR ORGAN ISCHEMIA INFARCT INC GUIDE ROADMAPPING  10/24/2016  . IR RENAL SUPRASEL UNI S&I MOD SED  10/24/2016  . IR RENAL  SUPRASEL UNI S&I MOD SED  10/24/2016  . IR US GUIDE VASC ACCESS RIGHT  10/24/2016  . JOINT REPLACEMENT Left   . MASS EXCISION Right 08/21/2014   Procedure: RIGHT FOREARM MASS EXCISION WITH REPAIR AND RECONSTRUCTION;  Surgeon: Roseanne Kaufman, MD;  Location: [place];  Service: Orthopedics;  Laterality: Right;  . TONSILLECTOMY     1969  . TOTAL KNEE ARTHROPLASTY  05/08/2011   Procedure: TOTAL KNEE ARTHROPLASTY;  Surgeon: Rudean Haskell, MD;  Location: [place];  Service: Orthopedics;  Laterality: Left;  . TUBAL LIGATION     1980     Allergies:  No Known Allergies   Social History:  reports that she has never smoked. She has never used smokeless tobacco. She reports that she does not drink alcohol or use drugs.   Family History: Family History  Problem Relation Age of Onset  . Colon polyps Sister   . Colon cancer Maternal Uncle   . Colon cancer Maternal Grandfather     Physical Exam: Vitals:   10/24/16 1625 10/24/16 1710 10/24/16 1816 10/24/16 1904  BP: (!) 161/98 (!) 165/80 (!) 173/80 (!) 171/82  Pulse: (!) 57 (!) 52 (!) 45 (!) 46  Resp: _0 Temp:  (!) 97.5 F (36.4 C)  (!) 97.5 F (36.4 C)  TempSrc:  Oral  Oral  SpO2: 98% 100% 95% 94%  Weight:  Height:  _0  (1.575 m)      Constitutional: Alert and awake, oriented x3, not in any acute distress. Eyes: PERLA, EOMI, irises appear normal, anicteric sclera,  ENMT: external ears and nose appear normal, lips appears normal, oropharynx mucosa, tongue, posterior pharynx appear normal  Neck: neck appears normal, no masses, normal ROM, no JVD  CVS: Rate ~45 and regular, no murmur rubs or gallops, no LE edema, normal pedal pulses  Respiratory:  clear to auscultation bilaterally, no wheezing, rales or rhonchi. Respiratory effort normal. No accessory muscle use.  Abdomen: soft nontender, nondistended, normal bowel sounds, no hepatosplenomegaly, no hernias  Musculoskeletal: : no cyanosis, clubbing or edema  noted bilaterally Neuro: Cranial nerves II-XII intact, strength, sensation, reflexes Psych: judgement and insight appear normal, stable mood and affect, mental status Skin: no rashes or lesions or ulcers, no induration or nodules    Data reviewed:  I have personally reviewed following labs and imaging studies Labs:  CBC:  Recent Labs Lab 10/24/16 1252  WBC 9.0  HGB 13.5  HCT 40.1  MCV 83.7  PLT 119    Basic Metabolic Panel:  Recent Labs Lab 10/24/16 1252  NA 140  K 3.4*  CL 104  CO2 25  GLUCOSE 95  BUN 14  CREATININE 0.98  CALCIUM 9.5   GFR Estimated Creatinine Clearance: 59.1 mL/min (by C-G formula based on SCr of 0.98 mg/dL). Liver Function Tests: No results for input(s): AST, ALT, ALKPHOS, BILITOT, PROT, ALBUMIN in the last 168 hours. No results for input(s): LIPASE, AMYLASE in the last 168 hours. No results for input(s): AMMONIA in the last 168 hours. Coagulation profile  Recent Labs Lab 10/24/16 1252  INR 0.91    Cardiac Enzymes: No results for input(s): CKTOTAL, CKMB, CKMBINDEX, TROPONINI in the last 168 hours. BNP: Invalid input(s): POCBNP CBG: No results for input(s): GLUCAP in the last 168 hours. D-Dimer No results for input(s): DDIMER in the last 72 hours. Hgb A1c No results for input(s): HGBA1C in the last 72 hours. Lipid Profile No results for input(s): CHOL, HDL, LDLCALC, TRIG, CHOLHDL, LDLDIRECT in the last 72 hours. Thyroid function studies No results for input(s): TSH, T4TOTAL, T3FREE, THYROIDAB in the last 72 hours.  Invalid input(s): FREET3 Anemia work up No results for input(s): VITAMINB12, FOLATE, FERRITIN, TIBC, IRON, RETICCTPCT in the last 72 hours. Urinalysis    Component Value Date/Time   COLORURINE YELLOW 08/24/2013 1121   APPEARANCEUR CLEAR 08/24/2013 1121   LABSPEC 1.013 08/24/2013 1121   PHURINE 6.0 08/24/2013 1121   GLUCOSEU NEGATIVE 08/24/2013 1121   HGBUR NEGATIVE 08/24/2013 1121   HGBUR trace-intact 01/10/2010  0851   BILIRUBINUR NEGATIVE 08/24/2013 1121   BILIRUBINUR n 03/20/2011 1122   KETONESUR NEGATIVE 08/24/2013 1121   PROTEINUR NEGATIVE 08/24/2013 1121   UROBILINOGEN 1.0 08/24/2013 1121   NITRITE NEGATIVE 08/24/2013 1121   LEUKOCYTESUR NEGATIVE 08/24/2013 1121     Microbiology No results found for this or any previous visit (from the past 240 hour(s)).     Inpatient Medications:   Scheduled Meds: . [START ON 10/25/2016] amLODipine  10 mg Oral q morning - 10a  . [START ON 10/25/2016] atenolol  100 mg Oral q morning - 10a  . buPROPion  75 mg Oral QHS  . cloNIDine  0.1 mg Oral QHS  . fentaNYL      . [START ON 10/25/2016] furosemide  40 mg Oral q morning - 10a  . HYDROmorphone   Intravenous Q4H  . lidocaine-EPINEPHrine      . [  START ON 10/25/2016] lisinopril  40 mg Oral Daily  . [START ON 10/25/2016] losartan  100 mg Oral Daily  . midazolam      . multivitamin with minerals  1 tablet Oral Daily  . simvastatin  40 mg Oral QHS  . topiramate  25 mg Oral BID  . venlafaxine XR  150 mg Oral BID   Continuous Infusions:   Radiological Exams on Admission: Ir Angiogram Renal Right Selective  Result Date: 10/24/2016 INDICATION: Large right-sided AML.  Please perform percutaneous embolization. Please refer to formal consultation in the epic EMR on 10/12/2016 for additional details. EXAM: 1. ULTRASOUND GUIDANCE FOR ARTERIAL ACCESS 2. SELECTIVE ACCESSORY RIGHT RENAL ARTERIOGRAM 3. SELECTIVE DOMINANT RIGHT RENAL ARTERIOGRAM 4. SELECTIVE SEGMENTAL RIGHT RENAL ARTERIOGRAM AND PERCUTANEOUS COIL AND PARTICLE EMBOLIZATION 5. ADDITIONAL SELECTIVE SEGMENTAL RIGHT RENAL ARTERIOGRAM COMPARISON:  CTA of the abdomen pelvis - 10/17/2016 MEDICATIONS: Ciprofloxacin 400 mg IV. The antibiotic was administered within one hour of the procedure ANESTHESIA/SEDATION: Moderate (conscious) sedation was employed during this procedure. A total of Versed 2 mg and Fentanyl 200 mcg was administered intravenously. Moderate  Sedation Time: 32 minutes. The patient's level of consciousness and vital signs were monitored continuously by radiology nursing throughout the procedure under my direct supervision. CONTRAST:  30 cc Isovue-300 FLUOROSCOPY TIME:  8 minutes 24 seconds (1,884 mGy) COMPLICATIONS: None immediate. PROCEDURE: Informed consent was obtained from the patient following explanation of the procedure, risks, benefits and alternatives. The patient understands, agrees and consents for the procedure. All questions were addressed. A time out was performed prior to the initiation of the procedure. Maximal barrier sterile technique utilized including caps, mask, sterile gowns, sterile gloves, large sterile drape, hand hygiene, and Betadine prep. The right femoral head was marked fluoroscopically. Under sterile conditions and local anesthesia, the right common femoral artery access was performed with a micropuncture needle. Under direct ultrasound guidance, the right common femoral was accessed with a micropuncture kit. An ultrasound image was saved for documentation purposes. This allowed for placement of a 5-French vascular sheath. A limited arteriogram was performed through the side arm of the sheath confirming appropriate access within the right common femoral artery. The 5-French Mickelson catheter was advanced into the caudal aspect of the thoracic aorta, formed back bled and flushed. The Mickelson catheter was utilized select the known accessory right renal artery and a selective accessory right renal arteriogram was performed. The Mickelson catheter was then slowly withdrawn caudally to select the dominant right renal artery and several selective dominant right renal arteriograms were performed in various obliquities. Next, with the use of an 0.014 Fathom micro wire, a regular Renegade microcatheter was advanced into the most inferior/caudal segmental branch of the inferior division of the right renal artery and a selective  arteriogram was performed. From this location, approximately 2/3 vial of 100-300 micron Embospheres was injected under continuous fluoroscopic guidance. Once vessel stasis was achieved, the microcatheter was withdrawn to more proximal location and a post particle embolization arteriogram was performed. Next, the microcatheter was advanced regional to the location of the particle embolization and 2 overlapping 3 mm diameter interlock coils were deployed. Again, the microcatheter was withdrawn into a more proximal location and a post embolization arteriogram was performed. Next, an adjacent cranial subsegmental branch of the inferior division of the right renal artery was selected and a sub selective arteriogram was performed. The microcatheter was removed and a completion arteriogram was performed through the [facility] catheter. Images were reviewed and the procedure was terminated. At  this point, all wires, catheters and sheaths were removed from the patient. Hemostasis was achieved at the right groin access site with The patient tolerated the procedure well without immediate post procedural complication. 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 Selective dominant right renal arteriogram demonstrates abnormal vascularity correlating with the angiogenic component of the AML supplied via the most caudal aspect of the inferior division of the right renal artery. Sub selective injection at this location confirmed this finding. The distal aspect of the inferior division of the right renal artery was successfully percutaneous particle and coil embolized as detailed above. Selective injection of an adjacent segmental branch arising from the inferior division of the right renal artery was negative for definitive abnormal vascularity to suggest contribution to the  angiogenic component of the AML. Completion arteriogram performed via the Mickelson catheter demonstrates a technically excellent result with complete stasis of the caudal most aspect of the inferior division of the right renal artery and no definitive abnormal vascularity to suggest a residual angiogenic component. IMPRESSION: Technically successful particle and coil embolization of the angiogenic component of the right sided AML as detailed above. PLAN: The patient will be seen in follow-up consultation at the interventional radiology clinic in approximately 3-4 weeks following the acquisition of a postprocedural BMP. Electronically Signed   By: Simonne Come M.D.   On: 10/24/2016 17:45   Ir Angiogram Renal Right Selective  Result Date: 10/24/2016 INDICATION: Large right-sided AML.  Please perform percutaneous embolization. Please refer to formal consultation in the epic EMR on 10/12/2016 for additional details. EXAM: 1. ULTRASOUND GUIDANCE FOR ARTERIAL ACCESS 2. SELECTIVE ACCESSORY RIGHT RENAL ARTERIOGRAM 3. SELECTIVE DOMINANT RIGHT RENAL ARTERIOGRAM 4. SELECTIVE SEGMENTAL RIGHT RENAL ARTERIOGRAM AND PERCUTANEOUS COIL AND PARTICLE EMBOLIZATION 5. ADDITIONAL SELECTIVE SEGMENTAL RIGHT RENAL ARTERIOGRAM COMPARISON:  CTA of the abdomen pelvis - 10/17/2016 MEDICATIONS: Ciprofloxacin 400 mg IV. The antibiotic was administered within one hour of the procedure ANESTHESIA/SEDATION: Moderate (conscious) sedation was employed during this procedure. A total of Versed 2 mg and Fentanyl 200 mcg was administered intravenously. Moderate Sedation Time: 32 minutes. The patient's level of consciousness and vital signs were monitored continuously by radiology nursing throughout the procedure under my direct supervision. CONTRAST:  30 cc Isovue-300 FLUOROSCOPY TIME:  8 minutes 24 seconds (1,210 mGy) COMPLICATIONS: None immediate. PROCEDURE: Informed consent was obtained from the patient following explanation of the procedure,  risks, benefits and alternatives. The patient understands, agrees and consents for the procedure. All questions were addressed. A time out was performed prior to the initiation of the procedure. Maximal barrier sterile technique utilized including caps, mask, sterile gowns, sterile gloves, large sterile drape, hand hygiene, and Betadine prep. The right femoral head was marked fluoroscopically. Under sterile conditions and local anesthesia, the right common femoral artery access was performed with a micropuncture needle. Under direct ultrasound guidance, the right common femoral was accessed with a micropuncture kit. An ultrasound image was saved for documentation purposes. This allowed for placement of a 5-French vascular sheath. A limited arteriogram was performed through the side arm of the sheath confirming appropriate access within the right common femoral artery. The 5-French Mickelson catheter was advanced into the caudal aspect of the thoracic aorta, formed back bled and flushed. The Mickelson catheter was utilized select the known accessory right renal artery and a selective accessory right renal arteriogram was performed. The Mickelson catheter was  then slowly withdrawn caudally to select the dominant right renal artery and several selective dominant right renal arteriograms were performed in various obliquities. Next, with the use of an 0.014 Fathom micro wire, a regular Renegade microcatheter was advanced into the most inferior/caudal segmental branch of the inferior division of the right renal artery and a selective arteriogram was performed. From this location, approximately 2/3 vial of 100-300 micron Embospheres was injected under continuous fluoroscopic guidance. Once vessel stasis was achieved, the microcatheter was withdrawn to more proximal location and a post particle embolization arteriogram was performed. Next, the microcatheter was advanced regional to the location of the particle embolization  and 2 overlapping 3 mm diameter interlock coils were deployed. Again, the microcatheter was withdrawn into a more proximal location and a post embolization arteriogram was performed. Next, an adjacent cranial subsegmental branch of the inferior division of the right renal artery was selected and a sub selective arteriogram was performed. The microcatheter was removed and a completion arteriogram was performed through the [facility] catheter. Images were reviewed and the procedure was terminated. At this point, all wires, catheters and sheaths were removed from the patient. Hemostasis was achieved at the right groin access site with The patient tolerated the procedure well without immediate post procedural complication. 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 Selective dominant right renal arteriogram demonstrates abnormal vascularity correlating with the angiogenic component of the AML supplied via the most caudal aspect of the inferior division of the right renal artery. Sub selective injection at this location confirmed this finding. The distal aspect of the inferior division of the right renal artery was successfully percutaneous particle and coil embolized as detailed above. Selective injection of an adjacent segmental branch arising from the inferior division of the right renal artery was negative for definitive abnormal vascularity to suggest contribution to the angiogenic component of the AML. Completion arteriogram performed via the Mickelson catheter demonstrates a technically excellent result with complete stasis of the caudal most aspect of the inferior division of the right renal artery and no definitive abnormal vascularity to suggest a residual angiogenic component. IMPRESSION: Technically successful particle and coil embolization of the  angiogenic component of the right sided AML as detailed above. PLAN: The patient will be seen in follow-up consultation at the interventional radiology clinic in approximately 3-4 weeks following the acquisition of a postprocedural BMP. Electronically Signed   By: Sandi Mariscal M.D.   On: 10/24/2016 17:45   Ir US Guide Vasc Access Right  Result Date: 10/24/2016 INDICATION: Large right-sided AML.  Please perform percutaneous embolization. Please refer to formal consultation in the epic EMR on 10/12/2016 for additional details. EXAM: 1. ULTRASOUND GUIDANCE FOR ARTERIAL ACCESS 2. SELECTIVE ACCESSORY RIGHT RENAL ARTERIOGRAM 3. SELECTIVE DOMINANT RIGHT RENAL ARTERIOGRAM 4. SELECTIVE SEGMENTAL RIGHT RENAL ARTERIOGRAM AND PERCUTANEOUS COIL AND PARTICLE EMBOLIZATION 5. ADDITIONAL SELECTIVE SEGMENTAL RIGHT RENAL ARTERIOGRAM COMPARISON:  CTA of the abdomen pelvis - 10/17/2016 MEDICATIONS: Ciprofloxacin 400 mg IV. The antibiotic was administered within one hour of the procedure ANESTHESIA/SEDATION: Moderate (conscious) sedation was employed during this procedure. A total of Versed 2 mg and Fentanyl 200 mcg was administered intravenously. Moderate Sedation Time: 32 minutes. The patient's level of consciousness and vital signs were monitored continuously by radiology nursing throughout the procedure under my direct supervision. CONTRAST:  30 cc Isovue-300 FLUOROSCOPY TIME:  8 minutes 24 seconds (7,867 mGy) COMPLICATIONS:  None immediate. PROCEDURE: Informed consent was obtained from the patient following explanation of the procedure, risks, benefits and alternatives. The patient understands, agrees and consents for the procedure. All questions were addressed. A time out was performed prior to the initiation of the procedure. Maximal barrier sterile technique utilized including caps, mask, sterile gowns, sterile gloves, large sterile drape, hand hygiene, and Betadine prep. The right femoral head was marked fluoroscopically.  Under sterile conditions and local anesthesia, the right common femoral artery access was performed with a micropuncture needle. Under direct ultrasound guidance, the right common femoral was accessed with a micropuncture kit. An ultrasound image was saved for documentation purposes. This allowed for placement of a 5-French vascular sheath. A limited arteriogram was performed through the side arm of the sheath confirming appropriate access within the right common femoral artery. The 5-French Mickelson catheter was advanced into the caudal aspect of the thoracic aorta, formed back bled and flushed. The Mickelson catheter was utilized select the known accessory right renal artery and a selective accessory right renal arteriogram was performed. The Mickelson catheter was then slowly withdrawn caudally to select the dominant right renal artery and several selective dominant right renal arteriograms were performed in various obliquities. Next, with the use of an 0.014 Fathom micro wire, a regular Renegade microcatheter was advanced into the most inferior/caudal segmental branch of the inferior division of the right renal artery and a selective arteriogram was performed. From this location, approximately 2/3 vial of 100-300 micron Embospheres was injected under continuous fluoroscopic guidance. Once vessel stasis was achieved, the microcatheter was withdrawn to more proximal location and a post particle embolization arteriogram was performed. Next, the microcatheter was advanced regional to the location of the particle embolization and 2 overlapping 3 mm diameter interlock coils were deployed. Again, the microcatheter was withdrawn into a more proximal location and a post embolization arteriogram was performed. Next, an adjacent cranial subsegmental branch of the inferior division of the right renal artery was selected and a sub selective arteriogram was performed. The microcatheter was removed and a completion  arteriogram was performed through the [facility] catheter. Images were reviewed and the procedure was terminated. At this point, all wires, catheters and sheaths were removed from the patient. Hemostasis was achieved at the right groin access site with The patient tolerated the procedure well without immediate post procedural complication. 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 Selective dominant right renal arteriogram demonstrates abnormal vascularity correlating with the angiogenic component of the AML supplied via the most caudal aspect of the inferior division of the right renal artery. Sub selective injection at this location confirmed this finding. The distal aspect of the inferior division of the right renal artery was successfully percutaneous particle and coil embolized as detailed above. Selective injection of an adjacent segmental branch arising from the inferior division of the right renal artery was negative for definitive abnormal vascularity to suggest contribution to the angiogenic component of the AML. Completion arteriogram performed via the Mickelson catheter demonstrates a technically excellent result with complete stasis of the caudal most aspect of the inferior division of the right renal artery and no definitive abnormal vascularity to suggest a residual angiogenic component. IMPRESSION: Technically successful particle and coil embolization of the angiogenic component of the right sided AML as detailed above. PLAN: The patient will be seen in follow-up consultation at the interventional radiology clinic  in approximately 3-4 weeks following the acquisition of a postprocedural BMP. Electronically Signed   By: Sandi Mariscal M.D.   On: 10/24/2016 17:45   Ir Embo Tumor Organ Ischemia Infarct Inc Guide Roadmapping  Result Date:  10/24/2016 INDICATION: Large right-sided AML.  Please perform percutaneous embolization. Please refer to formal consultation in the epic EMR on 10/12/2016 for additional details. EXAM: 1. ULTRASOUND GUIDANCE FOR ARTERIAL ACCESS 2. SELECTIVE ACCESSORY RIGHT RENAL ARTERIOGRAM 3. SELECTIVE DOMINANT RIGHT RENAL ARTERIOGRAM 4. SELECTIVE SEGMENTAL RIGHT RENAL ARTERIOGRAM AND PERCUTANEOUS COIL AND PARTICLE EMBOLIZATION 5. ADDITIONAL SELECTIVE SEGMENTAL RIGHT RENAL ARTERIOGRAM COMPARISON:  CTA of the abdomen pelvis - 10/17/2016 MEDICATIONS: Ciprofloxacin 400 mg IV. The antibiotic was administered within one hour of the procedure ANESTHESIA/SEDATION: Moderate (conscious) sedation was employed during this procedure. A total of Versed 2 mg and Fentanyl 200 mcg was administered intravenously. Moderate Sedation Time: 32 minutes. The patient's level of consciousness and vital signs were monitored continuously by radiology nursing throughout the procedure under my direct supervision. CONTRAST:  30 cc Isovue-300 FLUOROSCOPY TIME:  8 minutes 24 seconds (4,982 mGy) COMPLICATIONS: None immediate. PROCEDURE: Informed consent was obtained from the patient following explanation of the procedure, risks, benefits and alternatives. The patient understands, agrees and consents for the procedure. All questions were addressed. A time out was performed prior to the initiation of the procedure. Maximal barrier sterile technique utilized including caps, mask, sterile gowns, sterile gloves, large sterile drape, hand hygiene, and Betadine prep. The right femoral head was marked fluoroscopically. Under sterile conditions and local anesthesia, the right common femoral artery access was performed with a micropuncture needle. Under direct ultrasound guidance, the right common femoral was accessed with a micropuncture kit. An ultrasound image was saved for documentation purposes. This allowed for placement of a 5-French vascular sheath. A limited  arteriogram was performed through the side arm of the sheath confirming appropriate access within the right common femoral artery. The 5-French Mickelson catheter was advanced into the caudal aspect of the thoracic aorta, formed back bled and flushed. The Mickelson catheter was utilized select the known accessory right renal artery and a selective accessory right renal arteriogram was performed. The Mickelson catheter was then slowly withdrawn caudally to select the dominant right renal artery and several selective dominant right renal arteriograms were performed in various obliquities. Next, with the use of an 0.014 Fathom micro wire, a regular Renegade microcatheter was advanced into the most inferior/caudal segmental branch of the inferior division of the right renal artery and a selective arteriogram was performed. From this location, approximately 2/3 vial of 100-300 micron Embospheres was injected under continuous fluoroscopic guidance. Once vessel stasis was achieved, the microcatheter was withdrawn to more proximal location and a post particle embolization arteriogram was performed. Next, the microcatheter was advanced regional to the location of the particle embolization and 2 overlapping 3 mm diameter interlock coils were deployed. Again, the microcatheter was withdrawn into a more proximal location and a post embolization arteriogram was performed. Next, an adjacent cranial subsegmental branch of the inferior division of the right renal artery was selected and a sub selective arteriogram was performed. The microcatheter was removed and a completion arteriogram was performed through the [facility] catheter. Images were reviewed and the procedure was terminated. At this point, all wires, catheters and sheaths were removed from the patient. Hemostasis was achieved at the right groin access site with The patient tolerated the procedure well without immediate post procedural complication. FINDINGS: Selective  accessory right renal arteriogram fails  to definitively demonstrate abnormal vascularity or contribution to the known large AML arising from the inferior pole of the right kidney. Selective dominant right renal arteriogram demonstrates abnormal vascularity correlating with the angiogenic component of the AML supplied via the most caudal aspect of the inferior division of the right renal artery. Sub selective injection at this location confirmed this finding. The distal aspect of the inferior division of the right renal artery was successfully percutaneous particle and coil embolized as detailed above. Selective injection of an adjacent segmental branch arising from the inferior division of the right renal artery was negative for definitive abnormal vascularity to suggest contribution to the angiogenic component of the AML. Completion arteriogram performed via the Mickelson catheter demonstrates a technically excellent result with complete stasis of the caudal most aspect of the inferior division of the right renal artery and no definitive abnormal vascularity to suggest a residual angiogenic component. IMPRESSION: Technically successful particle and coil embolization of the angiogenic component of the right sided AML as detailed above. PLAN: The patient will be seen in follow-up consultation at the interventional radiology clinic in approximately 3-4 weeks following the acquisition of a postprocedural BMP. Electronically Signed   By: Sandi Mariscal M.D.   On: 10/24/2016 17:45    Impression/Recommendations  1. 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 Hypertension  - Continue Norvasc, clonidine, and Lasix - Hold atenolol while evaluating bradycardia - Use hydralazine IVP's prn   Thank you for this consultation.  Our [facility] hospitalist team will follow the patient with you.   Time Spent: 56 minutes.   Ilene Qua Serena Petterson M.D. [facility] 10/24/2016, 8:13 PM

## 2016-10-24 NOTE — Procedures (Signed)
Pre-procedure Diagnosis: Right sided AML Post-procedure Diagnosis: Same  Post technically successful particle and coil embolization of right sided AML.    Complications: None Immediate  EBL: None  Keep right leg straight for 4 hrs.    SignedSandi Mariscal Pager: [phone] 10/24/2016, 4:55 PM

## 2016-10-24 NOTE — Significant Event (Signed)
Rapid Response Event Note  Overview: RRT RN called by bedside RN to evaluate HR in 30-40 range. MD at bedside.    Initial Focused Assessment: Cardiac   Interventions:  Plan of Care (if not transferred): Dr Pascal Lux updated and [facility] to be called.  Event Summary: Upon arrival patient supine in bed. Offers no c/o. No chest pain or pain in jaw, neck or back area. Dozing off/on. SBP 170, HR Brady rhythm at 35-40's. RR 18. Patient has taken Norvasc, Lopressor, Catapres, and Linsinopril this am. Elink MD called and consulted suggested [facility] be called for overnight care. Dr Pascal Lux called and updated and gave admitting number for [facility]. Informed bedside RN to call RRT RN if further needed.        Sierra Banks F

## 2016-10-24 NOTE — H&P (Signed)
Referring Physician(s): Borden,L  Supervising Physician: Sandi Mariscal  Patient Status:  [facility] OP TBA  Chief Complaint:  Right renal angiomyolipoma  Subjective: Patient familiar to IR service from recent consultation with Dr. Pascal Lux on 10/12/16 to discuss treatment options for known 7.5 cm predominantly fat-containing right renal angiomyolipoma. She was deemed an appropriate candidate for embolization of the angiomyolipoma and presents today for the procedure. She is currently asymptomatic. Additional history as below.  Past Medical History:  Diagnosis Date  . ABSCESS, BREAST, RIGHT 10/04/2009  . DEPRESSION 08/28/2006  . Dysrhythmia    HEART PALPITATIONS   . GERD (gastroesophageal reflux disease)    OCC TUMS   . Hepatitis    JAUNDICE AGE 66  . 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 History of kidney stones 04/05/2016  . HYPERLIPIDEMIA 12/23/2007  . HYPERTENSION 08/28/2006  . Overweight(278.02) 01/12/2009   Past Surgical History:  Procedure Laterality Date  . CARPAL TUNNEL RELEASE     LEFT  2002  . DILATION AND CURETTAGE OF UTERUS     2011  . fatty tumor removed from right arm  2016  . JOINT REPLACEMENT Left   . MASS EXCISION Right 08/21/2014   Procedure: RIGHT FOREARM MASS EXCISION WITH REPAIR AND RECONSTRUCTION;  Surgeon: Roseanne Kaufman, MD;  Location: [facility];  Service: Orthopedics;  Laterality: Right;  . TONSILLECTOMY     1969  . TOTAL KNEE ARTHROPLASTY  05/08/2011   Procedure: TOTAL KNEE ARTHROPLASTY;  Surgeon: Rudean Haskell, MD;  Location: [place];  Service: Orthopedics;  Laterality: Left;  . TUBAL LIGATION     1980     Allergies: Patient has no known allergies.  Medications: Prior to Admission medications   Medication Sig Start Date End Date Taking? Authorizing Provider  amLODipine (NORVASC) 10 MG tablet TAKE 1 TABLET BY MOUTH EVERY MORNING Patient taking differently: take 1/2 tab every morning 04/11/12  Yes Dorena Cookey, MD  atenolol (TENORMIN)  100 MG tablet TAKE 1 TABLET EVERY MORNING 03/08/13  Yes Dorena Cookey, MD  buPROPion [facility]) 75 MG tablet TAKE 1 TABLET AT BEDTIME 09/20/12  Yes Dorena Cookey, MD  cloNIDine (CATAPRES) 0.1 MG tablet Take 0.1 mg by mouth at bedtime.    Yes [provider]  furosemide (LASIX) 40 MG tablet TAKE 1 TABLET BY MOUTH EVERY MORNING 09/20/12  Yes Dorena Cookey, MD  lisinopril (PRINIVIL,ZESTRIL) 40 MG tablet lisinopril 40 mg tablet   Yes [provider]  losartan (COZAAR) 100 MG tablet Take 100 mg by mouth daily.   Yes [provider]  Multiple Vitamin (MULITIVITAMIN WITH MINERALS) TABS Take 1 tablet by mouth daily.   Yes [provider]  simvastatin (ZOCOR) 40 MG tablet Take 40 mg by mouth at bedtime. 03/28/11  Yes Dorena Cookey, MD  topiramate (TOPAMAX) 25 MG tablet Take 25 mg by mouth 2 (two) times daily.   Yes [provider]  venlafaxine XR (EFFEXOR-XR) 150 MG 24 hr capsule TAKE ONE CAPSULE BY MOUTH TWICE A DAY 06/18/12  Yes Dorena Cookey, MD     Vital Signs: BP (!) 157/96   Pulse (!) 59   Temp 97.7 F (36.5 C) (Oral)   Resp 16   Wt 200 lb (90.7 kg)   SpO2 99%   BMI 36.58 kg/m   Physical Exam awake, alert. Chest clear to auscultation bilaterally. Heart with sl bradycardic but reg rhythm; abdomen obese, soft, positive bowel sounds, nontender. Ext with FROM, intact distal  pulses.  Imaging: No results found.  Labs:  CBC:  Recent Labs  03/20/16 2223 10/24/16 1252  WBC 13.0* 9.0  HGB 13.6 13.5  HCT 41.2 40.1  PLT 312 321    COAGS:  Recent Labs  10/24/16 1252  INR 0.91  APTT 30    BMP:  Recent Labs  03/20/16 2223 08/28/16 0919 10/17/16 0808 10/24/16 1252  NA 140  --   --  140  K 3.4*  --   --  3.4*  CL 104  --   --  104  CO2 26  --   --  25  GLUCOSE 138*  --   --  95  BUN 21*  --   --  14  CALCIUM 9.5  --   --  9.5  CREATININE 1.47* 1.00 1.20* 0.98  GFRNONAA 36*  --   --  59*  GFRAA 42*  --   --  >60     LIVER FUNCTION TESTS:  Recent Labs  03/20/16 2223  BILITOT 0.4  AST 23  ALT 19  ALKPHOS 86  PROT 7.5  ALBUMIN 4.4    Assessment and Plan: Pt with known 7.5 cm predominantly fat-containing right renal angiomyolipoma; seen in consultation on 10/12/16 by Dr. Pascal Lux and deemed an appropriate candidate for embolization of the angiomyolipoma ; she presents today for the procedure. Details/risks of procedure, including but not limited to postprocedural pain, hematuria, worsening renal function, bleeding/vessel injury, infection and nontarget embolization were discussed with patient and family with their understanding and consent. Postprocedure she would be admitted to the hospital for overnight observation.   Electronically Signed: D. Rowe Robert, PA-C 10/24/2016, 1:54 PM   I spent a total of 30 minutes at the the patient's bedside AND on the patient's hospital floor or unit, greater than 50% of which was counseling/coordinating care for renal arteriogram with right renal angiomyolipoma embolization

## 2016-10-24 NOTE — Progress Notes (Signed)
Pt HR sustaining in the 30-40 range.  Dr. Pascal Lux and rapid response made aware.  MD and rapid response at bedside.  Plan for [facility] to be called.  Pt asymptomatic and resting comfortably in bed.  Will continue to monitor closely.

## 2016-10-25 DIAGNOSIS — C9200 Acute myeloblastic leukemia, not having achieved remission: Secondary | ICD-10-CM | POA: Diagnosis not present

## 2016-10-25 DIAGNOSIS — D3001 Benign neoplasm of right kidney: Secondary | ICD-10-CM | POA: Diagnosis not present

## 2016-10-25 DIAGNOSIS — D1771 Benign lipomatous neoplasm of kidney: Secondary | ICD-10-CM | POA: Diagnosis not present

## 2016-10-25 DIAGNOSIS — Z79899 Other long term (current) drug therapy: Secondary | ICD-10-CM | POA: Diagnosis not present

## 2016-10-25 DIAGNOSIS — I517 Cardiomegaly: Secondary | ICD-10-CM | POA: Diagnosis not present

## 2016-10-25 DIAGNOSIS — R001 Bradycardia, unspecified: Secondary | ICD-10-CM | POA: Diagnosis not present

## 2016-10-25 DIAGNOSIS — I119 Hypertensive heart disease without heart failure: Secondary | ICD-10-CM | POA: Diagnosis not present

## 2016-10-25 LAB — BASIC METABOLIC PANEL
Anion gap: 7 (ref 5–15)
BUN: 20 mg/dL (ref 6–20)
CO2: 26 mmol/L (ref 22–32)
Calcium: 9.8 mg/dL (ref 8.9–10.3)
Chloride: 105 mmol/L (ref 101–111)
Creatinine, Ser: 1.24 mg/dL — ABNORMAL HIGH (ref 0.44–1.00)
GFR calc Af Amer: 51 mL/min — ABNORMAL LOW (ref >60)
GFR calc non Af Amer: 44 mL/min — ABNORMAL LOW (ref >60)
Glucose, Bld: 113 mg/dL — ABNORMAL HIGH (ref 65–99)
Potassium: 3.8 mmol/L (ref 3.5–5.1)
Sodium: 138 mmol/L (ref 135–145)

## 2016-10-25 LAB — T4, FREE: Free T4: 0.6 ng/dL — ABNORMAL LOW (ref 0.61–1.12)

## 2016-10-25 MED ORDER — HYDROCODONE-ACETAMINOPHEN 5-325 MG PO TABS
1.0000 | ORAL_TABLET | ORAL | Status: DC | PRN
  Administered 2016-10-25: 2 via ORAL
  Filled 2016-10-25: qty 2

## 2016-10-25 MED ORDER — MAGNESIUM HYDROXIDE 400 MG/5ML PO SUSP
15.0000 mL | Freq: Once | ORAL | Status: DC
  Filled 2016-10-25: qty 30

## 2016-10-25 MED ORDER — HYDROCODONE-ACETAMINOPHEN 5-325 MG PO TABS: 1.0000 | ORAL_TABLET | ORAL | 0 refills | Status: DC | PRN

## 2016-10-25 NOTE — Discharge Summary (Signed)
Physician Discharge Summary  Sierra Banks GNO:[id number] DOB: Apr 06, 1950 DOA: 10/24/2016  PCP: Josetta Huddle, MD  Admit date: 10/24/2016 Discharge date: 10/25/2016  Admitted From: Home  Disposition: Home   Recommendations for Outpatient Follow-up:  1. Follow up with PCP in 1-2 weeks 2. Please obtain BMP/CBC in one week 3. Please follow up result of Free T 3.   Home Health: no  Discharge Condition: Stable.  CODE STATUS: full code.  Diet recommendation: Heart Healthy  Brief/Interim Summary: 1. Sinus bradycardia  - Pt noted to have HR in mid-30's-40's after her procedure  - Confirmed with EKG, personally reviewed  - She has been asymptomatic with this; BP has remained stable  - No anginal complaints to suggest an ischemic etiology, troponin undetectable   -asymptomatic, HR better this am. Will continue to hold atenolol at discharge.  -Please follow Free T 3 results, to consider start synthroid.   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 Per IR.   3. Hypertension  - Continue Norvasc, clonidine, and Lasix - Hold atenolol due to bradycardia - Use hydralazine IVP's prn She is on both lisinopril and cozaar. Will hold lisinopril at discharge   4-Abnormal TSH level, elevated; await Free T 3 results. Explain to patient to follow results with PCP>   Discharge Diagnoses:  Active Problems:   Angiomyolipoma of right kidney   Bradycardia    Discharge Instructions  Discharge Instructions    Diet - low sodium heart healthy    Complete by:  As directed    Increase activity slowly    Complete by:  As directed      Allergies as of 10/25/2016   No Known Allergies     Medication List    STOP taking these medications   atenolol 50 MG tablet Commonly known as:  TENORMIN   lisinopril 40 MG tablet Commonly known as:  PRINIVIL,ZESTRIL     TAKE these  medications   amLODipine 10 MG tablet Commonly known as:  NORVASC TAKE 1 TABLET BY MOUTH EVERY MORNING What changed:  See the new instructions.   buPROPion 75 MG tablet Commonly known as:  WELLBUTRIN TAKE 1 TABLET AT BEDTIME   cloNIDine 0.1 MG tablet Commonly known as:  CATAPRES Take 0.1 mg by mouth at bedtime.   furosemide 40 MG tablet Commonly known as:  LASIX TAKE 1 TABLET BY MOUTH EVERY MORNING   HYDROcodone-acetaminophen 5-325 MG tablet Commonly known as:  NORCO/VICODIN Take 1-2 tablets by mouth every 4 (four) hours as needed for moderate pain.   losartan 100 MG tablet Commonly known as:  COZAAR Take 100 mg by mouth daily.   multivitamin with minerals Tabs tablet Take 1 tablet by mouth daily.   simvastatin 40 MG tablet Commonly known as:  ZOCOR Take 40 mg by mouth daily.   topiramate 25 MG tablet Commonly known as:  TOPAMAX Take 25 mg by mouth 2 (two) times daily.   venlafaxine XR 150 MG 24 hr capsule Commonly known as:  EFFEXOR-XR TAKE ONE CAPSULE BY MOUTH TWICE A DAY What changed:  See the new instructions.            Discharge Care Instructions        Start     Ordered   10/25/16 0000  HYDROcodone-acetaminophen (NORCO/VICODIN) 5-325 MG tablet  Every 4 hours PRN     10/25/16 1309   10/25/16  0000  Increase activity slowly     10/25/16 1309   10/25/16 0000  Diet - low sodium heart healthy     10/25/16 1309      No Known Allergies  Consultations:  IR   Procedures/Studies: Ir Angiogram Renal Right Selective  Result Date: 10/24/2016 INDICATION: Large right-sided AML.  Please perform percutaneous embolization. Please refer to formal consultation in the epic EMR on 10/12/2016 for additional details. EXAM: 1. ULTRASOUND GUIDANCE FOR ARTERIAL ACCESS 2. SELECTIVE ACCESSORY RIGHT RENAL ARTERIOGRAM 3. SELECTIVE DOMINANT RIGHT RENAL ARTERIOGRAM 4. SELECTIVE SEGMENTAL RIGHT RENAL ARTERIOGRAM AND PERCUTANEOUS COIL AND PARTICLE EMBOLIZATION 5. ADDITIONAL  SELECTIVE SEGMENTAL RIGHT RENAL ARTERIOGRAM COMPARISON:  CTA of the abdomen pelvis - 10/17/2016 MEDICATIONS: Ciprofloxacin 400 mg IV. The antibiotic was administered within one hour of the procedure ANESTHESIA/SEDATION: Moderate (conscious) sedation was employed during this procedure. A total of Versed 2 mg and Fentanyl 200 mcg was administered intravenously. Moderate Sedation Time: 32 minutes. The patient's level of consciousness and vital signs were monitored continuously by radiology nursing throughout the procedure under my direct supervision. CONTRAST:  30 cc Isovue-300 FLUOROSCOPY TIME:  8 minutes 24 seconds (9,924 mGy) COMPLICATIONS: None immediate. PROCEDURE: Informed consent was obtained from the patient following explanation of the procedure, risks, benefits and alternatives. The patient understands, agrees and consents for the procedure. All questions were addressed. A time out was performed prior to the initiation of the procedure. Maximal barrier sterile technique utilized including caps, mask, sterile gowns, sterile gloves, large sterile drape, hand hygiene, and Betadine prep. The right femoral head was marked fluoroscopically. Under sterile conditions and local anesthesia, the right common femoral artery access was performed with a micropuncture needle. Under direct ultrasound guidance, the right common femoral was accessed with a micropuncture kit. An ultrasound image was saved for documentation purposes. This allowed for placement of a 5-French vascular sheath. A limited arteriogram was performed through the side arm of the sheath confirming appropriate access within the right common femoral artery. The 5-French Mickelson catheter was advanced into the caudal aspect of the thoracic aorta, formed back bled and flushed. The Mickelson catheter was utilized select the known accessory right renal artery and a selective accessory right renal arteriogram was performed. The Mickelson catheter was then  slowly withdrawn caudally to select the dominant right renal artery and several selective dominant right renal arteriograms were performed in various obliquities. Next, with the use of an 0.014 Fathom micro wire, a regular Renegade microcatheter was advanced into the most inferior/caudal segmental branch of the inferior division of the right renal artery and a selective arteriogram was performed. From this location, approximately 2/3 vial of 100-300 micron Embospheres was injected under continuous fluoroscopic guidance. Once vessel stasis was achieved, the microcatheter was withdrawn to more proximal location and a post particle embolization arteriogram was performed. Next, the microcatheter was advanced regional to the location of the particle embolization and 2 overlapping 3 mm diameter interlock coils were deployed. Again, the microcatheter was withdrawn into a more proximal location and a post embolization arteriogram was performed. Next, an adjacent cranial subsegmental branch of the inferior division of the right renal artery was selected and a sub selective arteriogram was performed. The microcatheter was removed and a completion arteriogram was performed through the [facility] catheter. Images were reviewed and the procedure was terminated. At this point, all wires, catheters and sheaths were removed from the patient. Hemostasis was achieved at the right groin access site with The patient tolerated the procedure well without  immediate post procedural complication. 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 Selective dominant right renal arteriogram demonstrates abnormal vascularity correlating with the angiogenic component of the AML supplied via the most caudal aspect of the inferior division of the right renal artery. Sub selective injection at this  location confirmed this finding. The distal aspect of the inferior division of the right renal artery was successfully percutaneous particle and coil embolized as detailed above. Selective injection of an adjacent segmental branch arising from the inferior division of the right renal artery was negative for definitive abnormal vascularity to suggest contribution to the angiogenic component of the AML. Completion arteriogram performed via the Mickelson catheter demonstrates a technically excellent result with complete stasis of the caudal most aspect of the inferior division of the right renal artery and no definitive abnormal vascularity to suggest a residual angiogenic component. IMPRESSION: Technically successful particle and coil embolization of the angiogenic component of the right sided AML as detailed above. PLAN: The patient will be seen in follow-up consultation at the interventional radiology clinic in approximately 3-4 weeks following the acquisition of a postprocedural BMP. Electronically Signed   By: Sandi Mariscal M.D.   On: 10/24/2016 17:45   Ir Angiogram Renal Right Selective  Result Date: 10/24/2016 INDICATION: Large right-sided AML.  Please perform percutaneous embolization. Please refer to formal consultation in the epic EMR on 10/12/2016 for additional details. EXAM: 1. ULTRASOUND GUIDANCE FOR ARTERIAL ACCESS 2. SELECTIVE ACCESSORY RIGHT RENAL ARTERIOGRAM 3. SELECTIVE DOMINANT RIGHT RENAL ARTERIOGRAM 4. SELECTIVE SEGMENTAL RIGHT RENAL ARTERIOGRAM AND PERCUTANEOUS COIL AND PARTICLE EMBOLIZATION 5. ADDITIONAL SELECTIVE SEGMENTAL RIGHT RENAL ARTERIOGRAM COMPARISON:  CTA of the abdomen pelvis - 10/17/2016 MEDICATIONS: Ciprofloxacin 400 mg IV. The antibiotic was administered within one hour of the procedure ANESTHESIA/SEDATION: Moderate (conscious) sedation was employed during this procedure. A total of Versed 2 mg and Fentanyl 200 mcg was administered intravenously. Moderate Sedation Time: 32  minutes. The patient's level of consciousness and vital signs were monitored continuously by radiology nursing throughout the procedure under my direct supervision. CONTRAST:  30 cc Isovue-300 FLUOROSCOPY TIME:  8 minutes 24 seconds (9,326 mGy) COMPLICATIONS: None immediate. PROCEDURE: Informed consent was obtained from the patient following explanation of the procedure, risks, benefits and alternatives. The patient understands, agrees and consents for the procedure. All questions were addressed. A time out was performed prior to the initiation of the procedure. Maximal barrier sterile technique utilized including caps, mask, sterile gowns, sterile gloves, large sterile drape, hand hygiene, and Betadine prep. The right femoral head was marked fluoroscopically. Under sterile conditions and local anesthesia, the right common femoral artery access was performed with a micropuncture needle. Under direct ultrasound guidance, the right common femoral was accessed with a micropuncture kit. An ultrasound image was saved for documentation purposes. This allowed for placement of a 5-French vascular sheath. A limited arteriogram was performed through the side arm of the sheath confirming appropriate access within the right common femoral artery. The 5-French Mickelson catheter was advanced into the caudal aspect of the thoracic aorta, formed back bled and flushed. The Mickelson catheter was utilized select the known accessory right renal artery and a selective accessory right renal arteriogram was performed. The Mickelson catheter was then slowly withdrawn caudally to select the dominant right renal artery and several selective dominant right renal arteriograms were performed in various obliquities. Next, with the use of an  0.014 Fathom micro wire, a regular Renegade microcatheter was advanced into the most inferior/caudal segmental branch of the inferior division of the right renal artery and a selective arteriogram was  performed. From this location, approximately 2/3 vial of 100-300 micron Embospheres was injected under continuous fluoroscopic guidance. Once vessel stasis was achieved, the microcatheter was withdrawn to more proximal location and a post particle embolization arteriogram was performed. Next, the microcatheter was advanced regional to the location of the particle embolization and 2 overlapping 3 mm diameter interlock coils were deployed. Again, the microcatheter was withdrawn into a more proximal location and a post embolization arteriogram was performed. Next, an adjacent cranial subsegmental branch of the inferior division of the right renal artery was selected and a sub selective arteriogram was performed. The microcatheter was removed and a completion arteriogram was performed through the [facility] catheter. Images were reviewed and the procedure was terminated. At this point, all wires, catheters and sheaths were removed from the patient. Hemostasis was achieved at the right groin access site with The patient tolerated the procedure well without immediate post procedural complication. 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 Selective dominant right renal arteriogram demonstrates abnormal vascularity correlating with the angiogenic component of the AML supplied via the most caudal aspect of the inferior division of the right renal artery. Sub selective injection at this location confirmed this finding. The distal aspect of the inferior division of the right renal artery was successfully percutaneous particle and coil embolized as detailed above. Selective injection of an adjacent segmental branch arising from the inferior division of the right renal artery was negative for definitive abnormal vascularity to suggest contribution to the angiogenic component  of the AML. Completion arteriogram performed via the Mickelson catheter demonstrates a technically excellent result with complete stasis of the caudal most aspect of the inferior division of the right renal artery and no definitive abnormal vascularity to suggest a residual angiogenic component. IMPRESSION: Technically successful particle and coil embolization of the angiogenic component of the right sided AML as detailed above. PLAN: The patient will be seen in follow-up consultation at the interventional radiology clinic in approximately 3-4 weeks following the acquisition of a postprocedural BMP. Electronically Signed   By: Sandi Mariscal M.D.   On: 10/24/2016 17:45   Ir US Guide Vasc Access Right  Result Date: 10/24/2016 INDICATION: Large right-sided AML.  Please perform percutaneous embolization. Please refer to formal consultation in the epic EMR on 10/12/2016 for additional details. EXAM: 1. ULTRASOUND GUIDANCE FOR ARTERIAL ACCESS 2. SELECTIVE ACCESSORY RIGHT RENAL ARTERIOGRAM 3. SELECTIVE DOMINANT RIGHT RENAL ARTERIOGRAM 4. SELECTIVE SEGMENTAL RIGHT RENAL ARTERIOGRAM AND PERCUTANEOUS COIL AND PARTICLE EMBOLIZATION 5. ADDITIONAL SELECTIVE SEGMENTAL RIGHT RENAL ARTERIOGRAM COMPARISON:  CTA of the abdomen pelvis - 10/17/2016 MEDICATIONS: Ciprofloxacin 400 mg IV. The antibiotic was administered within one hour of the procedure ANESTHESIA/SEDATION: Moderate (conscious) sedation was employed during this procedure. A total of Versed 2 mg and Fentanyl 200 mcg was administered intravenously. Moderate Sedation Time: 32 minutes. The patient's level of consciousness and vital signs were monitored continuously by radiology nursing throughout the procedure under my direct supervision. CONTRAST:  30 cc Isovue-300 FLUOROSCOPY TIME:  8 minutes 24 seconds (4,469 mGy) COMPLICATIONS: None immediate. PROCEDURE: Informed consent was obtained from the patient following explanation of the procedure, risks, benefits and  alternatives. The patient understands, agrees and consents for the procedure.  All questions were addressed. A time out was performed prior to the initiation of the procedure. Maximal barrier sterile technique utilized including caps, mask, sterile gowns, sterile gloves, large sterile drape, hand hygiene, and Betadine prep. The right femoral head was marked fluoroscopically. Under sterile conditions and local anesthesia, the right common femoral artery access was performed with a micropuncture needle. Under direct ultrasound guidance, the right common femoral was accessed with a micropuncture kit. An ultrasound image was saved for documentation purposes. This allowed for placement of a 5-French vascular sheath. A limited arteriogram was performed through the side arm of the sheath confirming appropriate access within the right common femoral artery. The 5-French Mickelson catheter was advanced into the caudal aspect of the thoracic aorta, formed back bled and flushed. The Mickelson catheter was utilized select the known accessory right renal artery and a selective accessory right renal arteriogram was performed. The Mickelson catheter was then slowly withdrawn caudally to select the dominant right renal artery and several selective dominant right renal arteriograms were performed in various obliquities. Next, with the use of an 0.014 Fathom micro wire, a regular Renegade microcatheter was advanced into the most inferior/caudal segmental branch of the inferior division of the right renal artery and a selective arteriogram was performed. From this location, approximately 2/3 vial of 100-300 micron Embospheres was injected under continuous fluoroscopic guidance. Once vessel stasis was achieved, the microcatheter was withdrawn to more proximal location and a post particle embolization arteriogram was performed. Next, the microcatheter was advanced regional to the location of the particle embolization and 2 overlapping 3  mm diameter interlock coils were deployed. Again, the microcatheter was withdrawn into a more proximal location and a post embolization arteriogram was performed. Next, an adjacent cranial subsegmental branch of the inferior division of the right renal artery was selected and a sub selective arteriogram was performed. The microcatheter was removed and a completion arteriogram was performed through the [facility] catheter. Images were reviewed and the procedure was terminated. At this point, all wires, catheters and sheaths were removed from the patient. Hemostasis was achieved at the right groin access site with The patient tolerated the procedure well without immediate post procedural complication. 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 Selective dominant right renal arteriogram demonstrates abnormal vascularity correlating with the angiogenic component of the AML supplied via the most caudal aspect of the inferior division of the right renal artery. Sub selective injection at this location confirmed this finding. The distal aspect of the inferior division of the right renal artery was successfully percutaneous particle and coil embolized as detailed above. Selective injection of an adjacent segmental branch arising from the inferior division of the right renal artery was negative for definitive abnormal vascularity to suggest contribution to the angiogenic component of the AML. Completion arteriogram performed via the Mickelson catheter demonstrates a technically excellent result with complete stasis of the caudal most aspect of the inferior division of the right renal artery and no definitive abnormal vascularity to suggest a residual angiogenic component. IMPRESSION: Technically successful particle and coil embolization of the angiogenic component of the  right sided AML as detailed above. PLAN: The patient will be seen in follow-up consultation at the interventional radiology clinic in approximately 3-4 weeks following the acquisition of a postprocedural BMP. Electronically Signed   By: Sandi Mariscal M.D.   On: 10/24/2016 17:45   Ir  Embo Tumor Organ Ischemia Infarct Inc Guide Roadmapping  Result Date: 10/24/2016 INDICATION: Large right-sided AML.  Please perform percutaneous embolization. Please refer to formal consultation in the epic EMR on 10/12/2016 for additional details. EXAM: 1. ULTRASOUND GUIDANCE FOR ARTERIAL ACCESS 2. SELECTIVE ACCESSORY RIGHT RENAL ARTERIOGRAM 3. SELECTIVE DOMINANT RIGHT RENAL ARTERIOGRAM 4. SELECTIVE SEGMENTAL RIGHT RENAL ARTERIOGRAM AND PERCUTANEOUS COIL AND PARTICLE EMBOLIZATION 5. ADDITIONAL SELECTIVE SEGMENTAL RIGHT RENAL ARTERIOGRAM COMPARISON:  CTA of the abdomen pelvis - 10/17/2016 MEDICATIONS: Ciprofloxacin 400 mg IV. The antibiotic was administered within one hour of the procedure ANESTHESIA/SEDATION: Moderate (conscious) sedation was employed during this procedure. A total of Versed 2 mg and Fentanyl 200 mcg was administered intravenously. Moderate Sedation Time: 32 minutes. The patient's level of consciousness and vital signs were monitored continuously by radiology nursing throughout the procedure under my direct supervision. CONTRAST:  30 cc Isovue-300 FLUOROSCOPY TIME:  8 minutes 24 seconds (4,496 mGy) COMPLICATIONS: None immediate. PROCEDURE: Informed consent was obtained from the patient following explanation of the procedure, risks, benefits and alternatives. The patient understands, agrees and consents for the procedure. All questions were addressed. A time out was performed prior to the initiation of the procedure. Maximal barrier sterile technique utilized including caps, mask, sterile gowns, sterile gloves, large sterile drape, hand hygiene, and Betadine prep. The right femoral head was marked fluoroscopically.  Under sterile conditions and local anesthesia, the right common femoral artery access was performed with a micropuncture needle. Under direct ultrasound guidance, the right common femoral was accessed with a micropuncture kit. An ultrasound image was saved for documentation purposes. This allowed for placement of a 5-French vascular sheath. A limited arteriogram was performed through the side arm of the sheath confirming appropriate access within the right common femoral artery. The 5-French Mickelson catheter was advanced into the caudal aspect of the thoracic aorta, formed back bled and flushed. The Mickelson catheter was utilized select the known accessory right renal artery and a selective accessory right renal arteriogram was performed. The Mickelson catheter was then slowly withdrawn caudally to select the dominant right renal artery and several selective dominant right renal arteriograms were performed in various obliquities. Next, with the use of an 0.014 Fathom micro wire, a regular Renegade microcatheter was advanced into the most inferior/caudal segmental branch of the inferior division of the right renal artery and a selective arteriogram was performed. From this location, approximately 2/3 vial of 100-300 micron Embospheres was injected under continuous fluoroscopic guidance. Once vessel stasis was achieved, the microcatheter was withdrawn to more proximal location and a post particle embolization arteriogram was performed. Next, the microcatheter was advanced regional to the location of the particle embolization and 2 overlapping 3 mm diameter interlock coils were deployed. Again, the microcatheter was withdrawn into a more proximal location and a post embolization arteriogram was performed. Next, an adjacent cranial subsegmental branch of the inferior division of the right renal artery was selected and a sub selective arteriogram was performed. The microcatheter was removed and a completion  arteriogram was performed through the [facility] catheter. Images were reviewed and the procedure was terminated. At this point, all wires, catheters and sheaths were removed from the patient. Hemostasis was achieved at the right groin access site with The patient tolerated the procedure well without immediate post procedural complication. 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 Selective dominant right renal arteriogram demonstrates abnormal vascularity  correlating with the angiogenic component of the AML supplied via the most caudal aspect of the inferior division of the right renal artery. Sub selective injection at this location confirmed this finding. The distal aspect of the inferior division of the right renal artery was successfully percutaneous particle and coil embolized as detailed above. Selective injection of an adjacent segmental branch arising from the inferior division of the right renal artery was negative for definitive abnormal vascularity to suggest contribution to the angiogenic component of the AML. Completion arteriogram performed via the Mickelson catheter demonstrates a technically excellent result with complete stasis of the caudal most aspect of the inferior division of the right renal artery and no definitive abnormal vascularity to suggest a residual angiogenic component. IMPRESSION: Technically successful particle and coil embolization of the angiogenic component of the right sided AML as detailed above. PLAN: The patient will be seen in follow-up consultation at the interventional radiology clinic in approximately 3-4 weeks following the acquisition of a postprocedural BMP. Electronically Signed   By: Sandi Mariscal M.D.   On: 10/24/2016 17:45   Ct Angio Abd/pel W/ And/or W/o  Result Date: 10/17/2016 CLINICAL DATA:  History of  large right-sided angiomyolipoma. Please perform CTA of the abdomen pelvis for angiogram embolization planning purposes. EXAM: CT ANGIOGRAPHY ABDOMEN AND PELVIS WITH CONTRAST AND WITHOUT CONTRAST TECHNIQUE: Multidetector CT imaging of the abdomen and pelvis was performed using the standard protocol during bolus administration of intravenous contrast. Multiplanar reconstructed images and MIPs were obtained and reviewed to evaluate the vascular anatomy. CONTRAST:  100 cc Isovue 370 COMPARISON:  CT abdomen pelvis - 03/21/2016; abdominal MRI - 08/28/2016 FINDINGS: VASCULAR Aorta: Scattered mixed calcified and noncalcified atherosclerotic plaque within a normal caliber abdominal aorta, not resulting in hemodynamically significant stenosis. No abdominal aortic dissection or periaortic stranding. Celiac: Widely patent without hemodynamically significant stenosis. Conventional branching pattern. SMA: Widely patent without hemodynamically significant stenosis. Conventional branching pattern. Renals: Duplicated right-sided renal arteries. There is a tiny accessory right renal artery which supplies ventral aspect of the right kidney, however may contribute a trace amount of arterial supply to the cranial ventral aspect of the angiogenic component of the AML. The dominant right-sided renal artery is widely patent with its inferior most division supplying the majority of arterial supply to the angiogenic component of the AML. Solitary left-sided renal artery is widely patent without hemodynamically significant stenosis. No evidence of vessel irregularity to suggest FMD. IMA: Widely patent without hemodynamically significant stenosis. Inflow: The bilateral common, external and internal iliac arteries are of normal caliber and widely patent without a hemodynamically significant stenosis. Proximal Outflow: The bilateral common femoral artery is are of normal caliber and widely patent without hemodynamically significant stenosis.  The imaged portions of the bilateral superficial and deep femoral artery is widely patent without hemodynamically significant stenosis. Veins: The pelvic venous system and IVC appears widely patent on this arterial phase examination. Conventional bilateral renal veins. Review of the MIP images confirms the above findings. NON-VASCULAR Lower chest: Limited visualization of lower thorax demonstrates minimal dependent subpleural ground-glass atelectasis. No focal airspace opacities. No pleural effusion. Borderline cardiomegaly.  No pericardial effusion. Hepatobiliary: Normal hepatic contour. There is diffuse decrease in attenuation hepatic parenchyma on this postcontrast examination suggestive hepatic steatosis. There is a ill-defined area of suspected perfusional anomaly adjacent to the gallbladder fossa. No discrete hepatic lesions. Cholelithiasis without evidence of cholecystitis. No intra extrahepatic biliary duct dilatation. No ascites. Pancreas: Normal appearance of the pancreas. Spleen: Normal appearance of the spleen  Adrenals/Urinary Tract: Re- demonstrated large predominantly fat containing exophytic a.m. al arising from the mid in caudal aspects of the right kidney, measuring approximately 7.8 x 6.5 x 8.4 cm (as measured in greatest oblique axial (image 94, series 6, coronal image 85, series 8) dimensions respectively. The majority of the a.m. al is of lipid component with central Angiojet a component measuring only approximately 2.8 x 1.9 x 2.8 cm (axial image 103, series 6, coronal image 84, series 8). No evidence of prior or impending rupture. The AML is noted to displace the right renal pelvis cranial and ventrally (axial image 82, series 6, coronal image 81, series 8), however there is no evidence of urinary obstruction or delayed renal excretion. No evidence of nephrolithiasis on this postcontrast examination. No discrete left-sided renal lesions. Normal appearance the bilateral adrenal glands. Normal  appearance of the urinary bladder given degree distention. Stomach/Bowel: Scattered colonic diverticulosis without evidence of diverticulitis. Moderate colonic stool burden without evidence of enteric obstruction. Normal appearance of the terminal ileum and retrocecal appendix. No pneumoperitoneum, pneumatosis or portal venous gas Lymphatic: Scattered porta hepatis lymph nodes are numerous though individually not enlarged by size criteria with index porta hepatis node measuring 1.1 cm in greatest short axis diameter (image 67, series 6). No bulky retroperitoneal, mesenteric, pelvic or inguinal lymphadenopathy. Reproductive: Normal appearance of the pelvic organs. No discrete adnexal lesion. No free fluid the pelvic cul-de-sac. Other: Tiny mesenteric fat containing periumbilical hernia. Musculoskeletal: No acute or aggressive osseous abnormalities. Mild-to-moderate multilevel lumbar spine DDD, worse at L2-L3-L4-L5 with disc space height loss, endplate irregularity and small posteriorly directed disc osteophyte complexes at these locations. Scattered bone islands are seen with the bilateral femoral heads, right greater than left. IMPRESSION: VASCULAR 1. Scattered minimal amount of mixed calcified and noncalcified atherosclerotic plaque within a normal caliber abdominal aorta, not resulting in a hemodynamically significant stenosis. Aortic Atherosclerosis (ICD10-I70.0). 2. Duplicated right-sided renal arteries - the dominant right-sided renal artery appears to supply the majority of arterial blood flow to the angiogenic component of the right-sided AML. Anatomy amenable to percutaneous embolization as indicated. NON-VASCULAR 1. No acute findings within the abdomen or pelvis. 2. Unchanged large (approximately 7.8 cm) predominantly fat containing right-sided AML with angiogenic component estimated to measure approximately 2.8 cm. Electronically Signed   By: Sandi Mariscal M.D.   On: 10/17/2016 11:06        Subjective: No complaints. No pain   Discharge Exam: Vitals:   10/25/16 0400 10/25/16 0440  BP:  (!) 157/72  Pulse:  65  Resp: 14 14  Temp:  98.1 F (36.7 C)  SpO2: 94% 94%   Vitals:   10/24/16 2321 10/25/16 0026 10/25/16 0400 10/25/16 0440  BP:  (!) 156/67  (!) 157/72  Pulse:  73  65  Resp: _0 Temp:  97.6 F (36.4 C)  98.1 F (36.7 C)  TempSrc:  Oral  Oral  SpO2: 93% 94% 94% 94%  Weight:      Height:        General: Pt is alert, awake, not in acute distress Cardiovascular: RRR, S1/S2 +, no rubs, no gallops Respiratory: CTA bilaterally, no wheezing, no rhonchi Abdominal: Soft, NT, ND, bowel sounds + Extremities: no edema, no cyanosis    The results of significant diagnostics from this hospitalization (including imaging, microbiology, ancillary and laboratory) are listed below for reference.     Microbiology: No results found for this or any previous visit (from the past 240 hour(s)).  Labs: BNP (last 3 results) No results for input(s): BNP in the last 8760 hours. Basic Metabolic Panel:  Recent Labs Lab 10/24/16 1252  NA 140  K 3.4*  CL 104  CO2 25  GLUCOSE 95  BUN 14  CREATININE 0.98  CALCIUM 9.5   Liver Function Tests: No results for input(s): AST, ALT, ALKPHOS, BILITOT, PROT, ALBUMIN in the last 168 hours. No results for input(s): LIPASE, AMYLASE in the last 168 hours. No results for input(s): AMMONIA in the last 168 hours. CBC:  Recent Labs Lab 10/24/16 1252  WBC 9.0  HGB 13.5  HCT 40.1  MCV 83.7  PLT 321   Cardiac Enzymes:  Recent Labs Lab 10/24/16 2042  TROPONINI <0.03   BNP: Invalid input(s): POCBNP CBG: No results for input(s): GLUCAP in the last 168 hours. D-Dimer No results for input(s): DDIMER in the last 72 hours. Hgb A1c No results for input(s): HGBA1C in the last 72 hours. Lipid Profile No results for input(s): CHOL, HDL, LDLCALC, TRIG, CHOLHDL, LDLDIRECT in the last 72 hours. Thyroid  function studies  Recent Labs  10/24/16 2042  TSH 11.999*   Anemia work up No results for input(s): VITAMINB12, FOLATE, FERRITIN, TIBC, IRON, RETICCTPCT in the last 72 hours. Urinalysis    Component Value Date/Time   COLORURINE YELLOW 08/24/2013 1121   APPEARANCEUR CLEAR 08/24/2013 1121   LABSPEC 1.013 08/24/2013 1121   PHURINE 6.0 08/24/2013 1121   GLUCOSEU NEGATIVE 08/24/2013 1121   HGBUR NEGATIVE 08/24/2013 1121   HGBUR trace-intact 01/10/2010 0851   BILIRUBINUR NEGATIVE 08/24/2013 1121   BILIRUBINUR n 03/20/2011 1122   KETONESUR NEGATIVE 08/24/2013 1121   PROTEINUR NEGATIVE 08/24/2013 1121   UROBILINOGEN 1.0 08/24/2013 1121   NITRITE NEGATIVE 08/24/2013 1121   LEUKOCYTESUR NEGATIVE 08/24/2013 1121   Sepsis Labs Invalid input(s): PROCALCITONIN,  WBC,  LACTICIDVEN Microbiology No results found for this or any previous visit (from the past 240 hour(s)).   Time coordinating discharge: Over 30 minutes  SIGNED:   Elmarie Shiley, MD  [facility] 10/25/2016, 1:11 PM Pager   If 7PM-7AM, please contact night-coverage [email or web address] Password [email or web address]

## 2016-10-25 NOTE — Progress Notes (Signed)
Referring Physician(s): Borden,L  Supervising Physician: Sandi Mariscal  Patient Status:  [facility] - In-pt  Chief Complaint:  Right renal angiomyolipoma  Subjective: Pt doing fairly well this am; has had hot flashes since last night with 1 episode of n/v yesterday as well (possibly from dilaudid); currently denies fever,HA,CP, dyspnea, cough, abd pain, back pain;  Does have some mild rt hip discomfort. Has not voided today; no BM yet. TSH elevated, additional thyroid studies pend.   Allergies: Patient has no known allergies.  Medications: Prior to Admission medications   Medication Sig Start Date End Date Taking? Authorizing Provider  amLODipine (NORVASC) 10 MG tablet TAKE 1 TABLET BY MOUTH EVERY MORNING Patient taking differently: Take 0.5 tablet (5 mg) by mouth every morning 04/11/12  Yes Dorena Cookey, MD  atenolol (TENORMIN) 50 MG tablet Take 50 mg by mouth daily.  10/20/16  Yes [provider]  buPROPion (WELLBUTRIN) 75 MG tablet TAKE 1 TABLET AT BEDTIME 09/20/12  Yes Dorena Cookey, MD  cloNIDine (CATAPRES) 0.1 MG tablet Take 0.1 mg by mouth at bedtime.    Yes [provider]  furosemide (LASIX) 40 MG tablet TAKE 1 TABLET BY MOUTH EVERY MORNING 09/20/12  Yes Dorena Cookey, MD  lisinopril (PRINIVIL,ZESTRIL) 40 MG tablet Take 1 tablet by mouth daily   Yes [provider]  losartan (COZAAR) 100 MG tablet Take 100 mg by mouth daily.   Yes [provider]  Multiple Vitamin (MULITIVITAMIN WITH MINERALS) TABS Take 1 tablet by mouth daily.   Yes [provider]  simvastatin (ZOCOR) 40 MG tablet Take 40 mg by mouth daily.  03/28/11  Yes Dorena Cookey, MD  topiramate (TOPAMAX) 25 MG tablet Take 25 mg by mouth 2 (two) times daily.   Yes [provider]  venlafaxine XR (EFFEXOR-XR) 150 MG 24 hr capsule TAKE ONE CAPSULE BY MOUTH TWICE A DAY Patient taking differently: TAKE 2 CAPSULES BY MOUTH IN THE EVENING 06/18/12  Yes Dorena Cookey, MD       Vital Signs: BP (!) 157/72 (BP Location: Left Arm)   Pulse 65   Temp 98.1 F (36.7 C) (Oral)   Resp 14   Ht _0  (1.575 m)   Wt 200 lb (90.7 kg)   SpO2 94%   BMI 36.58 kg/m   Physical Exam awake/alert; chest- CTA bilat; heart- RRR (SR at 72); abd- obese, +BS,NT; puncture site rt CFA soft, mildly tender, no def hematoma, no bruising; LE with intact pulses, FROM  Imaging: Ir Angiogram Renal Right Selective  Result Date: 10/24/2016 INDICATION: Large right-sided AML.  Please perform percutaneous embolization. Please refer to formal consultation in the epic EMR on 10/12/2016 for additional details. EXAM: 1. ULTRASOUND GUIDANCE FOR ARTERIAL ACCESS 2. SELECTIVE ACCESSORY RIGHT RENAL ARTERIOGRAM 3. SELECTIVE DOMINANT RIGHT RENAL ARTERIOGRAM 4. SELECTIVE SEGMENTAL RIGHT RENAL ARTERIOGRAM AND PERCUTANEOUS COIL AND PARTICLE EMBOLIZATION 5. ADDITIONAL SELECTIVE SEGMENTAL RIGHT RENAL ARTERIOGRAM COMPARISON:  CTA of the abdomen pelvis - 10/17/2016 MEDICATIONS: Ciprofloxacin 400 mg IV. The antibiotic was administered within one hour of the procedure ANESTHESIA/SEDATION: Moderate (conscious) sedation was employed during this procedure. A total of Versed 2 mg and Fentanyl 200 mcg was administered intravenously. Moderate Sedation Time: 32 minutes. The patient's level of consciousness and vital signs were monitored continuously by radiology nursing throughout the procedure under my direct supervision. CONTRAST:  30 cc Isovue-300 FLUOROSCOPY TIME:  8 minutes 24 seconds (1,224 mGy) COMPLICATIONS: None immediate. PROCEDURE: Informed consent was obtained from the  patient following explanation of the procedure, risks, benefits and alternatives. The patient understands, agrees and consents for the procedure. All questions were addressed. A time out was performed prior to the initiation of the procedure. Maximal barrier sterile technique utilized including caps, mask, sterile gowns, sterile gloves, large sterile  drape, hand hygiene, and Betadine prep. The right femoral head was marked fluoroscopically. Under sterile conditions and local anesthesia, the right common femoral artery access was performed with a micropuncture needle. Under direct ultrasound guidance, the right common femoral was accessed with a micropuncture kit. An ultrasound image was saved for documentation purposes. This allowed for placement of a 5-French vascular sheath. A limited arteriogram was performed through the side arm of the sheath confirming appropriate access within the right common femoral artery. The 5-French Mickelson catheter was advanced into the caudal aspect of the thoracic aorta, formed back bled and flushed. The Mickelson catheter was utilized select the known accessory right renal artery and a selective accessory right renal arteriogram was performed. The Mickelson catheter was then slowly withdrawn caudally to select the dominant right renal artery and several selective dominant right renal arteriograms were performed in various obliquities. Next, with the use of an 0.014 Fathom micro wire, a regular Renegade microcatheter was advanced into the most inferior/caudal segmental branch of the inferior division of the right renal artery and a selective arteriogram was performed. From this location, approximately 2/3 vial of 100-300 micron Embospheres was injected under continuous fluoroscopic guidance. Once vessel stasis was achieved, the microcatheter was withdrawn to more proximal location and a post particle embolization arteriogram was performed. Next, the microcatheter was advanced regional to the location of the particle embolization and 2 overlapping 3 mm diameter interlock coils were deployed. Again, the microcatheter was withdrawn into a more proximal location and a post embolization arteriogram was performed. Next, an adjacent cranial subsegmental branch of the inferior division of the right renal artery was selected and a sub  selective arteriogram was performed. The microcatheter was removed and a completion arteriogram was performed through the [facility] catheter. Images were reviewed and the procedure was terminated. At this point, all wires, catheters and sheaths were removed from the patient. Hemostasis was achieved at the right groin access site with The patient tolerated the procedure well without immediate post procedural complication. 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 Selective dominant right renal arteriogram demonstrates abnormal vascularity correlating with the angiogenic component of the AML supplied via the most caudal aspect of the inferior division of the right renal artery. Sub selective injection at this location confirmed this finding. The distal aspect of the inferior division of the right renal artery was successfully percutaneous particle and coil embolized as detailed above. Selective injection of an adjacent segmental branch arising from the inferior division of the right renal artery was negative for definitive abnormal vascularity to suggest contribution to the angiogenic component of the AML. Completion arteriogram performed via the Mickelson catheter demonstrates a technically excellent result with complete stasis of the caudal most aspect of the inferior division of the right renal artery and no definitive abnormal vascularity to suggest a residual angiogenic component. IMPRESSION: Technically successful particle and coil embolization of the angiogenic component of the right sided AML as detailed above. PLAN: The patient will be seen in follow-up consultation at the interventional radiology clinic in approximately 3-4 weeks following the acquisition of a  postprocedural BMP. Electronically Signed   By: Sandi Mariscal M.D.   On: 10/24/2016 17:45   Ir  Angiogram Renal Right Selective  Result Date: 10/24/2016 INDICATION: Large right-sided AML.  Please perform percutaneous embolization. Please refer to formal consultation in the epic EMR on 10/12/2016 for additional details. EXAM: 1. ULTRASOUND GUIDANCE FOR ARTERIAL ACCESS 2. SELECTIVE ACCESSORY RIGHT RENAL ARTERIOGRAM 3. SELECTIVE DOMINANT RIGHT RENAL ARTERIOGRAM 4. SELECTIVE SEGMENTAL RIGHT RENAL ARTERIOGRAM AND PERCUTANEOUS COIL AND PARTICLE EMBOLIZATION 5. ADDITIONAL SELECTIVE SEGMENTAL RIGHT RENAL ARTERIOGRAM COMPARISON:  CTA of the abdomen pelvis - 10/17/2016 MEDICATIONS: Ciprofloxacin 400 mg IV. The antibiotic was administered within one hour of the procedure ANESTHESIA/SEDATION: Moderate (conscious) sedation was employed during this procedure. A total of Versed 2 mg and Fentanyl 200 mcg was administered intravenously. Moderate Sedation Time: 32 minutes. The patient's level of consciousness and vital signs were monitored continuously by radiology nursing throughout the procedure under my direct supervision. CONTRAST:  30 cc Isovue-300 FLUOROSCOPY TIME:  8 minutes 24 seconds (4,403 mGy) COMPLICATIONS: None immediate. PROCEDURE: Informed consent was obtained from the patient following explanation of the procedure, risks, benefits and alternatives. The patient understands, agrees and consents for the procedure. All questions were addressed. A time out was performed prior to the initiation of the procedure. Maximal barrier sterile technique utilized including caps, mask, sterile gowns, sterile gloves, large sterile drape, hand hygiene, and Betadine prep. The right femoral head was marked fluoroscopically. Under sterile conditions and local anesthesia, the right common femoral artery access was performed with a micropuncture needle. Under direct ultrasound guidance, the right common femoral was accessed with a micropuncture kit. An ultrasound image was saved for documentation purposes. This allowed for placement  of a 5-French vascular sheath. A limited arteriogram was performed through the side arm of the sheath confirming appropriate access within the right common femoral artery. The 5-French Mickelson catheter was advanced into the caudal aspect of the thoracic aorta, formed back bled and flushed. The Mickelson catheter was utilized select the known accessory right renal artery and a selective accessory right renal arteriogram was performed. The Mickelson catheter was then slowly withdrawn caudally to select the dominant right renal artery and several selective dominant right renal arteriograms were performed in various obliquities. Next, with the use of an 0.014 Fathom micro wire, a regular Renegade microcatheter was advanced into the most inferior/caudal segmental branch of the inferior division of the right renal artery and a selective arteriogram was performed. From this location, approximately 2/3 vial of 100-300 micron Embospheres was injected under continuous fluoroscopic guidance. Once vessel stasis was achieved, the microcatheter was withdrawn to more proximal location and a post particle embolization arteriogram was performed. Next, the microcatheter was advanced regional to the location of the particle embolization and 2 overlapping 3 mm diameter interlock coils were deployed. Again, the microcatheter was withdrawn into a more proximal location and a post embolization arteriogram was performed. Next, an adjacent cranial subsegmental branch of the inferior division of the right renal artery was selected and a sub selective arteriogram was performed. The microcatheter was removed and a completion arteriogram was performed through the [facility] catheter. Images were reviewed and the procedure was terminated. At this point, all wires, catheters and sheaths were removed from the patient. Hemostasis was achieved at the right groin access site with The patient tolerated the procedure well without immediate post  procedural complication. FINDINGS: Selective accessory right renal arteriogram fails to definitively demonstrate abnormal vascularity or contribution to the known large AML arising  from the inferior pole of the right kidney. Selective dominant right renal arteriogram demonstrates abnormal vascularity correlating with the angiogenic component of the AML supplied via the most caudal aspect of the inferior division of the right renal artery. Sub selective injection at this location confirmed this finding. The distal aspect of the inferior division of the right renal artery was successfully percutaneous particle and coil embolized as detailed above. Selective injection of an adjacent segmental branch arising from the inferior division of the right renal artery was negative for definitive abnormal vascularity to suggest contribution to the angiogenic component of the AML. Completion arteriogram performed via the Mickelson catheter demonstrates a technically excellent result with complete stasis of the caudal most aspect of the inferior division of the right renal artery and no definitive abnormal vascularity to suggest a residual angiogenic component. IMPRESSION: Technically successful particle and coil embolization of the angiogenic component of the right sided AML as detailed above. PLAN: The patient will be seen in follow-up consultation at the interventional radiology clinic in approximately 3-4 weeks following the acquisition of a postprocedural BMP. Electronically Signed   By: Sandi Mariscal M.D.   On: 10/24/2016 17:45   Ir US Guide Vasc Access Right  Result Date: 10/24/2016 INDICATION: Large right-sided AML.  Please perform percutaneous embolization. Please refer to formal consultation in the epic EMR on 10/12/2016 for additional details. EXAM: 1. ULTRASOUND GUIDANCE FOR ARTERIAL ACCESS 2. SELECTIVE ACCESSORY RIGHT RENAL ARTERIOGRAM 3. SELECTIVE DOMINANT RIGHT RENAL ARTERIOGRAM 4. SELECTIVE SEGMENTAL RIGHT RENAL  ARTERIOGRAM AND PERCUTANEOUS COIL AND PARTICLE EMBOLIZATION 5. ADDITIONAL SELECTIVE SEGMENTAL RIGHT RENAL ARTERIOGRAM COMPARISON:  CTA of the abdomen pelvis - 10/17/2016 MEDICATIONS: Ciprofloxacin 400 mg IV. The antibiotic was administered within one hour of the procedure ANESTHESIA/SEDATION: Moderate (conscious) sedation was employed during this procedure. A total of Versed 2 mg and Fentanyl 200 mcg was administered intravenously. Moderate Sedation Time: 32 minutes. The patient's level of consciousness and vital signs were monitored continuously by radiology nursing throughout the procedure under my direct supervision. CONTRAST:  30 cc Isovue-300 FLUOROSCOPY TIME:  8 minutes 24 seconds (8,264 mGy) COMPLICATIONS: None immediate. PROCEDURE: Informed consent was obtained from the patient following explanation of the procedure, risks, benefits and alternatives. The patient understands, agrees and consents for the procedure. All questions were addressed. A time out was performed prior to the initiation of the procedure. Maximal barrier sterile technique utilized including caps, mask, sterile gowns, sterile gloves, large sterile drape, hand hygiene, and Betadine prep. The right femoral head was marked fluoroscopically. Under sterile conditions and local anesthesia, the right common femoral artery access was performed with a micropuncture needle. Under direct ultrasound guidance, the right common femoral was accessed with a micropuncture kit. An ultrasound image was saved for documentation purposes. This allowed for placement of a 5-French vascular sheath. A limited arteriogram was performed through the side arm of the sheath confirming appropriate access within the right common femoral artery. The 5-French Mickelson catheter was advanced into the caudal aspect of the thoracic aorta, formed back bled and flushed. The Mickelson catheter was utilized select the known accessory right renal artery and a selective accessory  right renal arteriogram was performed. The Mickelson catheter was then slowly withdrawn caudally to select the dominant right renal artery and several selective dominant right renal arteriograms were performed in various obliquities. Next, with the use of an 0.014 Fathom micro wire, a regular Renegade microcatheter was advanced into the most inferior/caudal segmental branch of the inferior division of the right  renal artery and a selective arteriogram was performed. From this location, approximately 2/3 vial of 100-300 micron Embospheres was injected under continuous fluoroscopic guidance. Once vessel stasis was achieved, the microcatheter was withdrawn to more proximal location and a post particle embolization arteriogram was performed. Next, the microcatheter was advanced regional to the location of the particle embolization and 2 overlapping 3 mm diameter interlock coils were deployed. Again, the microcatheter was withdrawn into a more proximal location and a post embolization arteriogram was performed. Next, an adjacent cranial subsegmental branch of the inferior division of the right renal artery was selected and a sub selective arteriogram was performed. The microcatheter was removed and a completion arteriogram was performed through the [facility] catheter. Images were reviewed and the procedure was terminated. At this point, all wires, catheters and sheaths were removed from the patient. Hemostasis was achieved at the right groin access site with The patient tolerated the procedure well without immediate post procedural complication. 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 Selective dominant right renal arteriogram demonstrates abnormal vascularity correlating with the angiogenic component of the AML supplied via the most caudal aspect of the inferior  division of the right renal artery. Sub selective injection at this location confirmed this finding. The distal aspect of the inferior division of the right renal artery was successfully percutaneous particle and coil embolized as detailed above. Selective injection of an adjacent segmental branch arising from the inferior division of the right renal artery was negative for definitive abnormal vascularity to suggest contribution to the angiogenic component of the AML. Completion arteriogram performed via the Mickelson catheter demonstrates a technically excellent result with complete stasis of the caudal most aspect of the inferior division of the right renal artery and no definitive abnormal vascularity to suggest a residual angiogenic component. IMPRESSION: Technically successful particle and coil embolization of the angiogenic component of the right sided AML as detailed above. PLAN: The patient will be seen in follow-up consultation at the interventional radiology clinic in approximately 3-4 weeks following the acquisition of a postprocedural BMP. Electronically Signed   By: Sandi Mariscal M.D.   On: 10/24/2016 17:45   Ir Embo Tumor Organ Ischemia Infarct Inc Guide Roadmapping  Result Date: 10/24/2016 INDICATION: Large right-sided AML.  Please perform percutaneous embolization. Please refer to formal consultation in the epic EMR on 10/12/2016 for additional details. EXAM: 1. ULTRASOUND GUIDANCE FOR ARTERIAL ACCESS 2. SELECTIVE ACCESSORY RIGHT RENAL ARTERIOGRAM 3. SELECTIVE DOMINANT RIGHT RENAL ARTERIOGRAM 4. SELECTIVE SEGMENTAL RIGHT RENAL ARTERIOGRAM AND PERCUTANEOUS COIL AND PARTICLE EMBOLIZATION 5. ADDITIONAL SELECTIVE SEGMENTAL RIGHT RENAL ARTERIOGRAM COMPARISON:  CTA of the abdomen pelvis - 10/17/2016 MEDICATIONS: Ciprofloxacin 400 mg IV. The antibiotic was administered within one hour of the procedure ANESTHESIA/SEDATION: Moderate (conscious) sedation was employed during this procedure. A total of Versed  2 mg and Fentanyl 200 mcg was administered intravenously. Moderate Sedation Time: 32 minutes. The patient's level of consciousness and vital signs were monitored continuously by radiology nursing throughout the procedure under my direct supervision. CONTRAST:  30 cc Isovue-300 FLUOROSCOPY TIME:  8 minutes 24 seconds (2,229 mGy) COMPLICATIONS: None immediate. PROCEDURE: Informed consent was obtained from the patient following explanation of the procedure, risks, benefits and alternatives. The patient understands, agrees and consents for the procedure. All questions were addressed. A time out was performed prior to the initiation of the procedure. Maximal barrier sterile technique utilized  including caps, mask, sterile gowns, sterile gloves, large sterile drape, hand hygiene, and Betadine prep. The right femoral head was marked fluoroscopically. Under sterile conditions and local anesthesia, the right common femoral artery access was performed with a micropuncture needle. Under direct ultrasound guidance, the right common femoral was accessed with a micropuncture kit. An ultrasound image was saved for documentation purposes. This allowed for placement of a 5-French vascular sheath. A limited arteriogram was performed through the side arm of the sheath confirming appropriate access within the right common femoral artery. The 5-French Mickelson catheter was advanced into the caudal aspect of the thoracic aorta, formed back bled and flushed. The Mickelson catheter was utilized select the known accessory right renal artery and a selective accessory right renal arteriogram was performed. The Mickelson catheter was then slowly withdrawn caudally to select the dominant right renal artery and several selective dominant right renal arteriograms were performed in various obliquities. Next, with the use of an 0.014 Fathom micro wire, a regular Renegade microcatheter was advanced into the most inferior/caudal segmental branch of  the inferior division of the right renal artery and a selective arteriogram was performed. From this location, approximately 2/3 vial of 100-300 micron Embospheres was injected under continuous fluoroscopic guidance. Once vessel stasis was achieved, the microcatheter was withdrawn to more proximal location and a post particle embolization arteriogram was performed. Next, the microcatheter was advanced regional to the location of the particle embolization and 2 overlapping 3 mm diameter interlock coils were deployed. Again, the microcatheter was withdrawn into a more proximal location and a post embolization arteriogram was performed. Next, an adjacent cranial subsegmental branch of the inferior division of the right renal artery was selected and a sub selective arteriogram was performed. The microcatheter was removed and a completion arteriogram was performed through the [facility] catheter. Images were reviewed and the procedure was terminated. At this point, all wires, catheters and sheaths were removed from the patient. Hemostasis was achieved at the right groin access site with The patient tolerated the procedure well without immediate post procedural complication. 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 Selective dominant right renal arteriogram demonstrates abnormal vascularity correlating with the angiogenic component of the AML supplied via the most caudal aspect of the inferior division of the right renal artery. Sub selective injection at this location confirmed this finding. The distal aspect of the inferior division of the right renal artery was successfully percutaneous particle and coil embolized as detailed above. Selective injection of an adjacent segmental branch arising from the inferior division of the right renal artery was negative for  definitive abnormal vascularity to suggest contribution to the angiogenic component of the AML. Completion arteriogram performed via the Mickelson catheter demonstrates a technically excellent result with complete stasis of the caudal most aspect of the inferior division of the right renal artery and no definitive abnormal vascularity to suggest a residual angiogenic component. IMPRESSION: Technically successful particle and coil embolization of the angiogenic component of the right sided AML as detailed above. PLAN: The patient will be seen in follow-up consultation at the interventional radiology clinic in approximately 3-4 weeks following the acquisition of a postprocedural BMP. Electronically Signed   By: Simonne Come M.D.   On: 10/24/2016 17:45    Labs:  CBC:  Recent Labs  03/20/16 2223 10/24/16 1252  WBC 13.0* 9.0  HGB 13.6 13.5  HCT  41.2 40.1  PLT 312 321    COAGS:  Recent Labs  10/24/16 1252  INR 0.91  APTT 30    BMP:  Recent Labs  03/20/16 2223 08/28/16 0919 10/17/16 0808 10/24/16 1252  NA 140  --   --  140  K 3.4*  --   --  3.4*  CL 104  --   --  104  CO2 26  --   --  25  GLUCOSE 138*  --   --  95  BUN 21*  --   --  14  CALCIUM 9.5  --   --  9.5  CREATININE 1.47* 1.00 1.20* 0.98  GFRNONAA 36*  --   --  59*  GFRAA 42*  --   --  >60    LIVER FUNCTION TESTS:  Recent Labs  03/20/16 2223  BILITOT 0.4  AST 23  ALT 19  ALKPHOS 86  PROT 7.5  ALBUMIN 4.4    Assessment and Plan: 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 Pascal Lux in 3-4 weeks with BMP check; will defer discharge disposition to  [facility] pending further thyroid testing/thyroid hormone replacement (primary MD- America Brown).   Electronically Signed: D. Rowe Robert, PA-C 10/25/2016, 9:39 AM   I spent a total of 20 minutes at the the patient's bedside AND on the patient's hospital floor or unit, greater than 50% of which was counseling/coordinating care for right renal angiomyolipoma embolization    Patient ID: Sierra Banks, female   DOB: [birth year], 65 y.o.   MRN: [id number]

## 2016-10-25 NOTE — Care Management Obs Status (Signed)
[facility] NOTIFICATION   Patient Details  Name: TEKEISHA HAKIM MRN: [id number] Date of Birth: 11/07/1950   Medicare Observation Status Notification Given:  Yes    MahabirJuliann Pulse, RN 10/25/2016, 1:21 PM

## 2016-10-25 NOTE — Care Management Note (Signed)
Case Management Note  Patient Details  Name: DUSTEE BOTTENFIELD MRN: [id number] Date of Birth: 01/17/1951  Subjective/Objective:65 y/o f admitted w/Angiomyolipoma R kidney. From home.                    Action/Plan:d/c home.   Expected Discharge Date:  10/25/16               Expected Discharge Plan:  Home/Self Care  In-House Referral:     Discharge planning Services  CM Consult  Post Acute Care Choice:    Choice offered to:     DME Arranged:    DME Agency:     HH Arranged:    HH Agency:     Status of Service:  Completed, signed off  If discussed at [employer] of Stay Meetings, dates discussed:    Additional Comments:  Dessa Phi, RN 10/25/2016, 1:21 PM

## 2016-10-25 NOTE — Progress Notes (Signed)
20 ml of dilaudid wasted from PCA syringe.  Witnessed by Othella Boyer, RN.

## 2016-10-26 LAB — T3, FREE: T3, Free: 2.4 pg/mL (ref 2.0–4.4)

## 2016-10-30 ENCOUNTER — Other Ambulatory Visit: Payer: Self-pay | Admitting: *Deleted

## 2016-10-30 ENCOUNTER — Other Ambulatory Visit (HOSPITAL_COMMUNITY): Payer: Self-pay | Admitting: Interventional Radiology

## 2016-10-30 DIAGNOSIS — D1771 Benign lipomatous neoplasm of kidney: Secondary | ICD-10-CM

## 2016-10-31 DIAGNOSIS — I10 Essential (primary) hypertension: Secondary | ICD-10-CM | POA: Diagnosis not present

## 2016-10-31 DIAGNOSIS — E039 Hypothyroidism, unspecified: Secondary | ICD-10-CM | POA: Diagnosis not present

## 2016-11-13 DIAGNOSIS — D1771 Benign lipomatous neoplasm of kidney: Secondary | ICD-10-CM | POA: Diagnosis not present

## 2016-11-13 LAB — BASIC METABOLIC PANEL WITH GFR
BUN/Creatinine Ratio: 14 (calc) (ref 6–22)
BUN: 16 mg/dL (ref 7–25)
CO2: 30 mmol/L (ref 20–32)
Calcium: 9.1 mg/dL (ref 8.6–10.4)
Chloride: 104 mmol/L (ref 98–110)
Creat: 1.15 mg/dL — ABNORMAL HIGH (ref 0.50–0.99)
GFR, Est African American: 57 mL/min/{1.73_m2} — ABNORMAL LOW (ref >=60)
GFR, Est Non African American: 49 mL/min/{1.73_m2} — ABNORMAL LOW (ref >=60)
Glucose, Bld: 120 mg/dL (ref 65–139)
Potassium: 4.1 mmol/L (ref 3.5–5.3)
Sodium: 140 mmol/L (ref 135–146)

## 2016-11-16 ENCOUNTER — Ambulatory Visit
Admission: RE | Admit: 2016-11-16 | Discharge: 2016-11-16 | Disposition: A | Discharge status: Home / Self Care | Payer: Medicare Other | Source: Ambulatory Visit | Attending: Interventional Radiology | Admitting: Interventional Radiology

## 2016-11-16 DIAGNOSIS — Z9889 Other specified postprocedural states: Secondary | ICD-10-CM | POA: Diagnosis not present

## 2016-11-16 DIAGNOSIS — D3001 Benign neoplasm of right kidney: Secondary | ICD-10-CM | POA: Diagnosis not present

## 2016-11-16 DIAGNOSIS — D1771 Benign lipomatous neoplasm of kidney: Secondary | ICD-10-CM

## 2016-11-16 HISTORY — PX: IR RADIOLOGIST EVAL & MGMT: IMG5224

## 2016-11-16 NOTE — Progress Notes (Signed)
Patient ID: Sierra Banks, female   DOB: 05/12/1950, 66 y.o.   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 female with past medical history significant for hypertension, hyperlipidemia, obesity, GERD and depression who was in generally found to have a right-sided AML on CT scan of the abdomen and pelvis performed 03/31/16 performed for the workup of contralateral left-sided flank pain. Subsequent abdominal MRI performed 08/28/2016 confirmed the lesion to represent a primarily fat-containing right sided renal AML.   Patient was initially seen in consultation at the interventional radiology clinic on 10/12/2016 for evaluation of potential percutaneous treatment options. Following this discussion, the patient underwent a right-sided renal embolization performed on 10/24/2016. She returns today for procedure evaluation and management.  The patient is unaccompanied and serves as her own historian.  The patient reports very minimal amount of right-sided flank pain following the procedure (not enough to even fill her prescribed course of postprocedural narcotics).  The patient has since completed recovered from the procedure. She is performing all activities of daily living without incident. No fever or chills. No flank pain. No hematuria. No change in bowel or bladder functions.    Past Medical History:  Diagnosis Date  . ABSCESS, BREAST, RIGHT 10/04/2009  . DEPRESSION 08/28/2006  . Dysrhythmia    HEART PALPITATIONS   . GERD (gastroesophageal reflux disease)    OCC TUMS   . Hepatitis    JAUNDICE AGE 46  . 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 History of kidney stones 04/05/2016  . HYPERLIPIDEMIA 12/23/2007  . HYPERTENSION 08/28/2006  . Overweight(278.02) 01/12/2009    Past Surgical History:  Procedure Laterality Date  . CARPAL TUNNEL RELEASE       LEFT  2002  . DILATION AND CURETTAGE OF UTERUS     2011  . fatty tumor removed from right arm  2016  . IR EMBO TUMOR ORGAN ISCHEMIA INFARCT INC GUIDE ROADMAPPING  10/24/2016  . IR RENAL SUPRASEL UNI S&I MOD SED  10/24/2016  . IR RENAL SUPRASEL UNI S&I MOD SED  10/24/2016  . IR US GUIDE VASC ACCESS RIGHT  10/24/2016  . JOINT REPLACEMENT Left   . MASS EXCISION Right 08/21/2014   Procedure: RIGHT FOREARM MASS EXCISION WITH REPAIR AND RECONSTRUCTION;  Surgeon: Roseanne Kaufman, MD;  Location: [facility];  Service: Orthopedics;  Laterality: Right;  . TONSILLECTOMY     1969  . TOTAL KNEE ARTHROPLASTY  05/08/2011   Procedure: TOTAL KNEE ARTHROPLASTY;  Surgeon: Rudean Haskell, MD;  Location: [facility];  Service: Orthopedics;  Laterality: Left;  . TUBAL LIGATION     1980    Allergies: Patient has no known allergies.  Medications: Prior to Admission medications   Medication Sig Start Date End Date Taking? Authorizing Provider  amLODipine (NORVASC) 10 MG tablet TAKE 1 TABLET BY MOUTH EVERY MORNING Patient taking differently: Take 0.5 tablet (5 mg) by mouth every morning 04/11/12  Yes Dorena Cookey, MD  buPROPion [facility]) 75 MG tablet TAKE 1 TABLET AT BEDTIME 09/20/12  Yes Dorena Cookey, MD  cloNIDine (CATAPRES) 0.1 MG tablet Take 0.1 mg by mouth at bedtime.    Yes [provider]  furosemide (LASIX) 40 MG tablet TAKE 1 TABLET BY MOUTH EVERY MORNING 09/20/12  Yes Dorena Cookey, MD  levothyroxine (SYNTHROID, LEVOTHROID) 25 MCG tablet  Take 25 mcg by mouth daily before breakfast.   Yes [provider]  losartan (COZAAR) 100 MG tablet Take 100 mg by mouth daily.   Yes [provider]  Multiple Vitamin (MULITIVITAMIN WITH MINERALS) TABS Take 1 tablet by mouth daily.   Yes [provider]  simvastatin (ZOCOR) 40 MG tablet Take 40 mg by mouth daily.  03/28/11  Yes Dorena Cookey, MD  topiramate (TOPAMAX) 25 MG tablet Take 25 mg by mouth 2 (two) times  daily.   Yes [provider]  venlafaxine XR (EFFEXOR-XR) 150 MG 24 hr capsule TAKE ONE CAPSULE BY MOUTH TWICE A DAY Patient taking differently: TAKE 2 CAPSULES BY MOUTH IN THE EVENING 06/18/12  Yes Dorena Cookey, MD  HYDROcodone-acetaminophen (NORCO/VICODIN) 5-325 MG tablet Take 1-2 tablets by mouth every 4 (four) hours as needed for moderate pain. Patient not taking: Reported on 11/16/2016 10/25/16   Elmarie Shiley, MD     Family History  Problem Relation Age of Onset  . Colon polyps Sister   . Colon cancer Maternal Uncle   . Colon cancer Maternal Grandfather     Social History   Social History  . Marital status: Married    Spouse name: N/A  . Number of children: N/A  . Years of education: N/A   Social History Main Topics  . Smoking status: Never Smoker  . Smokeless tobacco: Never Used  . Alcohol use No  . Drug use: No  . Sexual activity: Not on file   Other Topics Concern  . Not on file   Social History Narrative  . No narrative on file    ECOG Status: 0 - Asymptomatic  Review of Systems: A 12 point ROS discussed and pertinent positives are indicated in the HPI above.  All other systems are negative.  Review of Systems  Vital Signs: BP 139/71   Pulse 72   Temp 98.4 F (36.9 C) (Oral)   Resp 15   SpO2 95%   Physical Exam   Imaging: Ir Angiogram Renal Right Selective  Result Date: 10/24/2016 INDICATION: Large right-sided AML.  Please perform percutaneous embolization. Please refer to formal consultation in the epic EMR on 10/12/2016 for additional details. EXAM: 1. ULTRASOUND GUIDANCE FOR ARTERIAL ACCESS 2. SELECTIVE ACCESSORY RIGHT RENAL ARTERIOGRAM 3. SELECTIVE DOMINANT RIGHT RENAL ARTERIOGRAM 4. SELECTIVE SEGMENTAL RIGHT RENAL ARTERIOGRAM AND PERCUTANEOUS COIL AND PARTICLE EMBOLIZATION 5. ADDITIONAL SELECTIVE SEGMENTAL RIGHT RENAL ARTERIOGRAM COMPARISON:  CTA of the abdomen pelvis - 10/17/2016 MEDICATIONS: Ciprofloxacin 400 mg IV. The antibiotic  was administered within one hour of the procedure ANESTHESIA/SEDATION: Moderate (conscious) sedation was employed during this procedure. A total of Versed 2 mg and Fentanyl 200 mcg was administered intravenously. Moderate Sedation Time: 32 minutes. The patient's level of consciousness and vital signs were monitored continuously by radiology nursing throughout the procedure under my direct supervision. CONTRAST:  30 cc Isovue-300 FLUOROSCOPY TIME:  8 minutes 24 seconds (1,638 mGy) COMPLICATIONS: None immediate. PROCEDURE: Informed consent was obtained from the patient following explanation of the procedure, risks, benefits and alternatives. The patient understands, agrees and consents for the procedure. All questions were addressed. A time out was performed prior to the initiation of the procedure. Maximal barrier sterile technique utilized including caps, mask, sterile gowns, sterile gloves, large sterile drape, hand hygiene, and Betadine prep. The right femoral head was marked fluoroscopically. Under sterile conditions and local anesthesia, the right common femoral artery access was performed with a micropuncture needle. Under direct ultrasound guidance,  the right common femoral was accessed with a micropuncture kit. An ultrasound image was saved for documentation purposes. This allowed for placement of a 5-French vascular sheath. A limited arteriogram was performed through the side arm of the sheath confirming appropriate access within the right common femoral artery. The 5-French Mickelson catheter was advanced into the caudal aspect of the thoracic aorta, formed back bled and flushed. The Mickelson catheter was utilized select the known accessory right renal artery and a selective accessory right renal arteriogram was performed. The Mickelson catheter was then slowly withdrawn caudally to select the dominant right renal artery and several selective dominant right renal arteriograms were performed in various  obliquities. Next, with the use of an 0.014 Fathom micro wire, a regular Renegade microcatheter was advanced into the most inferior/caudal segmental branch of the inferior division of the right renal artery and a selective arteriogram was performed. From this location, approximately 2/3 vial of 100-300 micron Embospheres was injected under continuous fluoroscopic guidance. Once vessel stasis was achieved, the microcatheter was withdrawn to more proximal location and a post particle embolization arteriogram was performed. Next, the microcatheter was advanced regional to the location of the particle embolization and 2 overlapping 3 mm diameter interlock coils were deployed. Again, the microcatheter was withdrawn into a more proximal location and a post embolization arteriogram was performed. Next, an adjacent cranial subsegmental branch of the inferior division of the right renal artery was selected and a sub selective arteriogram was performed. The microcatheter was removed and a completion arteriogram was performed through the [facility] catheter. Images were reviewed and the procedure was terminated. At this point, all wires, catheters and sheaths were removed from the patient. Hemostasis was achieved at the right groin access site with The patient tolerated the procedure well without immediate post procedural complication. 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 Selective dominant right renal arteriogram demonstrates abnormal vascularity correlating with the angiogenic component of the AML supplied via the most caudal aspect of the inferior division of the right renal artery. Sub selective injection at this location confirmed this finding. The distal aspect of the inferior division of the right renal artery was successfully percutaneous particle and coil  embolized as detailed above. Selective injection of an adjacent segmental branch arising from the inferior division of the right renal artery was negative for definitive abnormal vascularity to suggest contribution to the angiogenic component of the AML. Completion arteriogram performed via the Mickelson catheter demonstrates a technically excellent result with complete stasis of the caudal most aspect of the inferior division of the right renal artery and no definitive abnormal vascularity to suggest a residual angiogenic component. IMPRESSION: Technically successful particle and coil embolization of the angiogenic component of the right sided AML as detailed above. PLAN: The patient will be seen in follow-up consultation at the interventional radiology clinic in approximately 3-4 weeks following the acquisition of a postprocedural BMP. Electronically Signed   By: Sandi Mariscal M.D.   On: 10/24/2016 17:45   Ir Angiogram Renal Right Selective  Result Date: 10/24/2016 INDICATION: Large right-sided AML.  Please perform percutaneous embolization. Please refer to formal consultation in the epic EMR on 10/12/2016 for additional details. EXAM: 1. ULTRASOUND GUIDANCE FOR ARTERIAL ACCESS 2. SELECTIVE ACCESSORY RIGHT RENAL ARTERIOGRAM 3. SELECTIVE DOMINANT RIGHT RENAL ARTERIOGRAM 4. SELECTIVE SEGMENTAL RIGHT RENAL ARTERIOGRAM AND PERCUTANEOUS COIL AND PARTICLE EMBOLIZATION 5. ADDITIONAL SELECTIVE  SEGMENTAL RIGHT RENAL ARTERIOGRAM COMPARISON:  CTA of the abdomen pelvis - 10/17/2016 MEDICATIONS: Ciprofloxacin 400 mg IV. The antibiotic was administered within one hour of the procedure ANESTHESIA/SEDATION: Moderate (conscious) sedation was employed during this procedure. A total of Versed 2 mg and Fentanyl 200 mcg was administered intravenously. Moderate Sedation Time: 32 minutes. The patient's level of consciousness and vital signs were monitored continuously by radiology nursing throughout the procedure under my direct  supervision. CONTRAST:  30 cc Isovue-300 FLUOROSCOPY TIME:  8 minutes 24 seconds (4,174 mGy) COMPLICATIONS: None immediate. PROCEDURE: Informed consent was obtained from the patient following explanation of the procedure, risks, benefits and alternatives. The patient understands, agrees and consents for the procedure. All questions were addressed. A time out was performed prior to the initiation of the procedure. Maximal barrier sterile technique utilized including caps, mask, sterile gowns, sterile gloves, large sterile drape, hand hygiene, and Betadine prep. The right femoral head was marked fluoroscopically. Under sterile conditions and local anesthesia, the right common femoral artery access was performed with a micropuncture needle. Under direct ultrasound guidance, the right common femoral was accessed with a micropuncture kit. An ultrasound image was saved for documentation purposes. This allowed for placement of a 5-French vascular sheath. A limited arteriogram was performed through the side arm of the sheath confirming appropriate access within the right common femoral artery. The 5-French Mickelson catheter was advanced into the caudal aspect of the thoracic aorta, formed back bled and flushed. The Mickelson catheter was utilized select the known accessory right renal artery and a selective accessory right renal arteriogram was performed. The Mickelson catheter was then slowly withdrawn caudally to select the dominant right renal artery and several selective dominant right renal arteriograms were performed in various obliquities. Next, with the use of an 0.014 Fathom micro wire, a regular Renegade microcatheter was advanced into the most inferior/caudal segmental branch of the inferior division of the right renal artery and a selective arteriogram was performed. From this location, approximately 2/3 vial of 100-300 micron Embospheres was injected under continuous fluoroscopic guidance. Once vessel stasis  was achieved, the microcatheter was withdrawn to more proximal location and a post particle embolization arteriogram was performed. Next, the microcatheter was advanced regional to the location of the particle embolization and 2 overlapping 3 mm diameter interlock coils were deployed. Again, the microcatheter was withdrawn into a more proximal location and a post embolization arteriogram was performed. Next, an adjacent cranial subsegmental branch of the inferior division of the right renal artery was selected and a sub selective arteriogram was performed. The microcatheter was removed and a completion arteriogram was performed through the [facility] catheter. Images were reviewed and the procedure was terminated. At this point, all wires, catheters and sheaths were removed from the patient. Hemostasis was achieved at the right groin access site with The patient tolerated the procedure well without immediate post procedural complication. 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 Selective dominant right renal arteriogram demonstrates abnormal vascularity correlating with the angiogenic component of the AML supplied via the most caudal aspect of the inferior division of the right renal artery. Sub selective injection at this location confirmed this finding. The distal aspect of the inferior division of the right renal artery was successfully percutaneous particle and coil embolized as detailed above. Selective injection of an adjacent segmental branch arising from the inferior division of the right  renal artery was negative for definitive abnormal vascularity to suggest contribution to the angiogenic component of the AML. Completion arteriogram performed via the Mickelson catheter demonstrates a technically excellent result with complete stasis of the caudal most  aspect of the inferior division of the right renal artery and no definitive abnormal vascularity to suggest a residual angiogenic component. IMPRESSION: Technically successful particle and coil embolization of the angiogenic component of the right sided AML as detailed above. PLAN: The patient will be seen in follow-up consultation at the interventional radiology clinic in approximately 3-4 weeks following the acquisition of a postprocedural BMP. Electronically Signed   By: Sandi Mariscal M.D.   On: 10/24/2016 17:45   Ir US Guide Vasc Access Right  Result Date: 10/24/2016 INDICATION: Large right-sided AML.  Please perform percutaneous embolization. Please refer to formal consultation in the epic EMR on 10/12/2016 for additional details. EXAM: 1. ULTRASOUND GUIDANCE FOR ARTERIAL ACCESS 2. SELECTIVE ACCESSORY RIGHT RENAL ARTERIOGRAM 3. SELECTIVE DOMINANT RIGHT RENAL ARTERIOGRAM 4. SELECTIVE SEGMENTAL RIGHT RENAL ARTERIOGRAM AND PERCUTANEOUS COIL AND PARTICLE EMBOLIZATION 5. ADDITIONAL SELECTIVE SEGMENTAL RIGHT RENAL ARTERIOGRAM COMPARISON:  CTA of the abdomen pelvis - 10/17/2016 MEDICATIONS: Ciprofloxacin 400 mg IV. The antibiotic was administered within one hour of the procedure ANESTHESIA/SEDATION: Moderate (conscious) sedation was employed during this procedure. A total of Versed 2 mg and Fentanyl 200 mcg was administered intravenously. Moderate Sedation Time: 32 minutes. The patient's level of consciousness and vital signs were monitored continuously by radiology nursing throughout the procedure under my direct supervision. CONTRAST:  30 cc Isovue-300 FLUOROSCOPY TIME:  8 minutes 24 seconds (5,188 mGy) COMPLICATIONS: None immediate. PROCEDURE: Informed consent was obtained from the patient following explanation of the procedure, risks, benefits and alternatives. The patient understands, agrees and consents for the procedure. All questions were addressed. A time out was performed prior to the initiation of the  procedure. Maximal barrier sterile technique utilized including caps, mask, sterile gowns, sterile gloves, large sterile drape, hand hygiene, and Betadine prep. The right femoral head was marked fluoroscopically. Under sterile conditions and local anesthesia, the right common femoral artery access was performed with a micropuncture needle. Under direct ultrasound guidance, the right common femoral was accessed with a micropuncture kit. An ultrasound image was saved for documentation purposes. This allowed for placement of a 5-French vascular sheath. A limited arteriogram was performed through the side arm of the sheath confirming appropriate access within the right common femoral artery. The 5-French Mickelson catheter was advanced into the caudal aspect of the thoracic aorta, formed back bled and flushed. The Mickelson catheter was utilized select the known accessory right renal artery and a selective accessory right renal arteriogram was performed. The Mickelson catheter was then slowly withdrawn caudally to select the dominant right renal artery and several selective dominant right renal arteriograms were performed in various obliquities. Next, with the use of an 0.014 Fathom micro wire, a regular Renegade microcatheter was advanced into the most inferior/caudal segmental branch of the inferior division of the right renal artery and a selective arteriogram was performed. From this location, approximately 2/3 vial of 100-300 micron Embospheres was injected under continuous fluoroscopic guidance. Once vessel stasis was achieved, the microcatheter was withdrawn to more proximal location and a post particle embolization arteriogram was performed. Next, the microcatheter was advanced regional to the location of the particle embolization and 2 overlapping 3 mm diameter interlock coils were deployed. Again, the microcatheter was withdrawn into a more proximal location and a post embolization arteriogram was performed.  Next, an adjacent cranial subsegmental branch of the inferior division of the right renal artery was selected and a sub selective arteriogram was performed. The microcatheter was removed and a completion arteriogram was performed through the [facility] catheter. Images were reviewed and the procedure was terminated. At this point, all wires, catheters and sheaths were removed from the patient. Hemostasis was achieved at the right groin access site with The patient tolerated the procedure well without immediate post procedural complication. 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 Selective dominant right renal arteriogram demonstrates abnormal vascularity correlating with the angiogenic component of the AML supplied via the most caudal aspect of the inferior division of the right renal artery. Sub selective injection at this location confirmed this finding. The distal aspect of the inferior division of the right renal artery was successfully percutaneous particle and coil embolized as detailed above. Selective injection of an adjacent segmental branch arising from the inferior division of the right renal artery was negative for definitive abnormal vascularity to suggest contribution to the angiogenic component of the AML. Completion arteriogram performed via the Mickelson catheter demonstrates a technically excellent result with complete stasis of the caudal most aspect of the inferior division of the right renal artery and no definitive abnormal vascularity to suggest a residual angiogenic component. IMPRESSION: Technically successful particle and coil embolization of the angiogenic component of the right sided AML as detailed above. PLAN: The patient will be seen in follow-up consultation at the interventional radiology clinic in approximately 3-4 weeks  following the acquisition of a postprocedural BMP. Electronically Signed   By: Sandi Mariscal M.D.   On: 10/24/2016 17:45   Ir Embo Tumor Organ Ischemia Infarct Inc Guide Roadmapping  Result Date: 10/24/2016 INDICATION: Large right-sided AML.  Please perform percutaneous embolization. Please refer to formal consultation in the epic EMR on 10/12/2016 for additional details. EXAM: 1. ULTRASOUND GUIDANCE FOR ARTERIAL ACCESS 2. SELECTIVE ACCESSORY RIGHT RENAL ARTERIOGRAM 3. SELECTIVE DOMINANT RIGHT RENAL ARTERIOGRAM 4. SELECTIVE SEGMENTAL RIGHT RENAL ARTERIOGRAM AND PERCUTANEOUS COIL AND PARTICLE EMBOLIZATION 5. ADDITIONAL SELECTIVE SEGMENTAL RIGHT RENAL ARTERIOGRAM COMPARISON:  CTA of the abdomen pelvis - 10/17/2016 MEDICATIONS: Ciprofloxacin 400 mg IV. The antibiotic was administered within one hour of the procedure ANESTHESIA/SEDATION: Moderate (conscious) sedation was employed during this procedure. A total of Versed 2 mg and Fentanyl 200 mcg was administered intravenously. Moderate Sedation Time: 32 minutes. The patient's level of consciousness and vital signs were monitored continuously by radiology nursing throughout the procedure under my direct supervision. CONTRAST:  30 cc Isovue-300 FLUOROSCOPY TIME:  8 minutes 24 seconds (8,341 mGy) COMPLICATIONS: None immediate. PROCEDURE: Informed consent was obtained from the patient following explanation of the procedure, risks, benefits and alternatives. The patient understands, agrees and consents for the procedure. All questions were addressed. A time out was performed prior to the initiation of the procedure. Maximal barrier sterile technique utilized including caps, mask, sterile gowns, sterile gloves, large sterile drape, hand hygiene, and Betadine prep. The right femoral head was marked fluoroscopically. Under sterile conditions and local anesthesia, the right common femoral artery access was performed with a micropuncture needle. Under direct ultrasound  guidance, the right common femoral was accessed with a micropuncture kit. An ultrasound image was saved for documentation purposes. This allowed for placement of a 5-French vascular sheath. A limited arteriogram was performed through the side arm of the sheath confirming appropriate  access within the right common femoral artery. The 5-French Mickelson catheter was advanced into the caudal aspect of the thoracic aorta, formed back bled and flushed. The Mickelson catheter was utilized select the known accessory right renal artery and a selective accessory right renal arteriogram was performed. The Mickelson catheter was then slowly withdrawn caudally to select the dominant right renal artery and several selective dominant right renal arteriograms were performed in various obliquities. Next, with the use of an 0.014 Fathom micro wire, a regular Renegade microcatheter was advanced into the most inferior/caudal segmental branch of the inferior division of the right renal artery and a selective arteriogram was performed. From this location, approximately 2/3 vial of 100-300 micron Embospheres was injected under continuous fluoroscopic guidance. Once vessel stasis was achieved, the microcatheter was withdrawn to more proximal location and a post particle embolization arteriogram was performed. Next, the microcatheter was advanced regional to the location of the particle embolization and 2 overlapping 3 mm diameter interlock coils were deployed. Again, the microcatheter was withdrawn into a more proximal location and a post embolization arteriogram was performed. Next, an adjacent cranial subsegmental branch of the inferior division of the right renal artery was selected and a sub selective arteriogram was performed. The microcatheter was removed and a completion arteriogram was performed through the [facility] catheter. Images were reviewed and the procedure was terminated. At this point, all wires, catheters and sheaths  were removed from the patient. Hemostasis was achieved at the right groin access site with The patient tolerated the procedure well without immediate post procedural complication. 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 Selective dominant right renal arteriogram demonstrates abnormal vascularity correlating with the angiogenic component of the AML supplied via the most caudal aspect of the inferior division of the right renal artery. Sub selective injection at this location confirmed this finding. The distal aspect of the inferior division of the right renal artery was successfully percutaneous particle and coil embolized as detailed above. Selective injection of an adjacent segmental branch arising from the inferior division of the right renal artery was negative for definitive abnormal vascularity to suggest contribution to the angiogenic component of the AML. Completion arteriogram performed via the Mickelson catheter demonstrates a technically excellent result with complete stasis of the caudal most aspect of the inferior division of the right renal artery and no definitive abnormal vascularity to suggest a residual angiogenic component. IMPRESSION: Technically successful particle and coil embolization of the angiogenic component of the right sided AML as detailed above. PLAN: The patient will be seen in follow-up consultation at the interventional radiology clinic in approximately 3-4 weeks following the acquisition of a postprocedural BMP. Electronically Signed   By: Sandi Mariscal M.D.   On: 10/24/2016 17:45    Labs:  CBC:  Recent Labs  03/20/16 2223 10/24/16 1252  WBC 13.0* 9.0  HGB 13.6 13.5  HCT 41.2 40.1  PLT 312 321    COAGS:  Recent Labs  10/24/16 1252  INR 0.91  APTT 30    BMP:  Recent Labs  03/20/16 2223   10/17/16 0808 10/24/16 1252 10/25/16 0634 11/13/16 1244  NA 140  --   --  140 138 140  K 3.4*  --   --  3.4* 3.8 4.1  CL 104  --   --  104 105 104  CO2 26  --   --  _0 GLUCOSE 138*  --   --  95 113* 120  BUN 21*  --   --  _1 CALCIUM 9.5  --   --  9.5 9.8 9.1  CREATININE 1.47*  < > 1.20* 0.98 1.24* 1.15*  GFRNONAA 36*  --   --  59* 44* 49*  GFRAA 42*  --   --  >60 51* 57*  < > = values in this interval not displayed.  LIVER FUNCTION TESTS:  Recent Labs  03/20/16 2223  BILITOT 0.4  AST 23  ALT 19  ALKPHOS 86  PROT 7.5  ALBUMIN 4.4    TUMOR MARKERS: No results for input(s): AFPTM, CEA, CA199, CHROMGRNA in the last 8760 hours.  Assessment and Plan:  Sierra Banks is a 66 y.o. female with past medical history significant for hypertension, hyperlipidemia, obesity, GERD and depression who was in generally found to have a right-sided AML on CT scan of the abdomen and pelvis performed 03/31/16 performed for the workup of contralateral left-sided flank pain. Subsequent abdominal MRI performed 08/28/2016 confirmed the lesion to represent a primarily fat-containing right sided renal AML for which she underwent a technically successful right-sided renal embolization on 10/24/2016.   The patient has recovered completely from this procedure is currently without complaint.  Selected images from right-sided renal arteriogram and embolization performed 10/24/2016, pre-procedural CT scan performed 10/17/2016 and abdominal MRI performed 08/28/2016 were reviewed in detail with the patient.  All the patient's questions and concerns were addressed.  The patient will return to the interventional radiology clinic following the acquisition of the initial postprocedural abdominal MRI which will be performed 3 months following the embolization (mid December of this year).  She was encouraged to call the interventional radiology clinic with any interval questions or concerns.  A copy of  this report was sent to the requesting provider on this date.  Electronically Signed: Sandi Mariscal 11/16/2016, 12:18 PM   I spent a total of 15 Minutes in face to face in clinical consultation, greater than 50% of which was counseling/coordinating care for post right sided renal AML.

## 2016-11-19 DIAGNOSIS — Z23 Encounter for immunization: Secondary | ICD-10-CM | POA: Diagnosis not present

## 2016-11-20 DIAGNOSIS — K8020 Calculus of gallbladder without cholecystitis without obstruction: Secondary | ICD-10-CM | POA: Diagnosis not present

## 2016-11-22 DIAGNOSIS — R2231 Localized swelling, mass and lump, right upper limb: Secondary | ICD-10-CM | POA: Diagnosis not present

## 2016-11-23 ENCOUNTER — Encounter: Payer: Self-pay | Admitting: Internal Medicine

## 2016-11-23 ENCOUNTER — Ambulatory Visit (INDEPENDENT_AMBULATORY_CARE_PROVIDER_SITE_OTHER): Payer: Medicare Other | Admitting: Internal Medicine

## 2016-11-23 VITALS — BP 152/82 | HR 73 | Ht 62.5 in | Wt 219.0 lb

## 2016-11-23 DIAGNOSIS — Z01810 Encounter for preprocedural cardiovascular examination: Secondary | ICD-10-CM

## 2016-11-23 DIAGNOSIS — Z8679 Personal history of other diseases of the circulatory system: Secondary | ICD-10-CM

## 2016-11-23 DIAGNOSIS — E782 Mixed hyperlipidemia: Secondary | ICD-10-CM

## 2016-11-23 DIAGNOSIS — I10 Essential (primary) hypertension: Secondary | ICD-10-CM | POA: Diagnosis not present

## 2016-11-23 NOTE — Patient Instructions (Signed)
Medication Instructions:  Continue current medications  If you need a refill on your cardiac medications before your next appointment, please call your pharmacy.  Labwork: None Ordered   Testing/Procedures: None Ordered  Follow-Up: Your physician wants you to follow-up in: As Needed.   Special Instructions: You are cleared for surgery a copy of Dr Debara Pickett office note will be faxed to surgeon.  Thank you for choosing [facility] at [facility]!!

## 2016-11-23 NOTE — Progress Notes (Signed)
OFFICE CONSULT NOTE  Chief Complaint:  Preoperative risk assessment  Primary Care Physician: Josetta Huddle, MD  HPI:  Sierra Banks is a 66 y.o. female who is being seen today for the evaluation of preoperative risk assessment at the request of Rolm Bookbinder, MD. Sierra Banks was seen today at the request of Dr. Donne Hazel for preoperative risk assessment. 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 She underwent arterial embolization and postoperatively was complicated by sinus bradycardia. She had heart rates in the 30s and 40s. She was asymptomatic with this. She was noted to be on 100 mg of atenolol that was decreased to 50 mg and ultimately discontinued. Subsequently the medication was restarted by her primary care provider and she's had no further recurrence of bradycardia that she's aware of. She was found to be mildly hypothyroid with a TSH of 11. She was started on low-dose levothyroxine 25 g daily and seems to be tolerating this. As part of her workup for the renal mass she was found to have numerous gallstones and has been symptomatic with this. Subsequently a cholecystectomy is recommended. She's here for cardiac clearance for that procedure. She denies any chest pain, shortness of breath with exertion and is able to do more than 4 metabolic equivalents of activity without any limitation.  PMHx:  Past Medical History:  Diagnosis Date  . ABSCESS, BREAST, RIGHT 10/04/2009  . DEPRESSION 08/28/2006  . Dysrhythmia    HEART PALPITATIONS   . GERD (gastroesophageal reflux disease)    OCC TUMS   . Hepatitis    JAUNDICE AGE 31  . 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 History of kidney stones 04/05/2016  . HYPERLIPIDEMIA 12/23/2007  . HYPERTENSION 08/28/2006  . Overweight(278.02) 01/12/2009    Past Surgical History:  Procedure Laterality Date  . CARPAL TUNNEL RELEASE     LEFT  2002  . DILATION AND CURETTAGE OF UTERUS     2011  . fatty tumor removed  from right arm  2016  . IR EMBO TUMOR ORGAN ISCHEMIA INFARCT INC GUIDE ROADMAPPING  10/24/2016  . IR RENAL SUPRASEL UNI S&I MOD SED  10/24/2016  . IR RENAL SUPRASEL UNI S&I MOD SED  10/24/2016  . IR US GUIDE VASC ACCESS RIGHT  10/24/2016  . JOINT REPLACEMENT Left   . MASS EXCISION Right 08/21/2014   Procedure: RIGHT FOREARM MASS EXCISION WITH REPAIR AND RECONSTRUCTION;  Surgeon: Roseanne Kaufman, MD;  Location: [place];  Service: Orthopedics;  Laterality: Right;  . TONSILLECTOMY     1969  . TOTAL KNEE ARTHROPLASTY  05/08/2011   Procedure: TOTAL KNEE ARTHROPLASTY;  Surgeon: Rudean Haskell, MD;  Location: [place];  Service: Orthopedics;  Laterality: Left;  . TUBAL LIGATION     1980    FAMHx:  Family History  Problem Relation Age of Onset  . Colon polyps Sister   . Colon cancer Maternal Uncle   . Colon cancer Maternal Grandfather   . Heart disease Mother   . Hypertension Mother     SOCHx:   reports that she has never smoked. She has never used smokeless tobacco. She reports that she does not drink alcohol or use drugs.  ALLERGIES:  No Known Allergies  ROS: Pertinent items noted in HPI and remainder of comprehensive ROS otherwise negative.  HOME MEDS: Current Outpatient Prescriptions on File Prior to Visit  Medication Sig Dispense Refill  . amLODipine (NORVASC) 10 MG tablet TAKE 1 TABLET BY MOUTH EVERY MORNING (  Patient taking differently: Take 0.5 tablet (5 mg) by mouth every morning) 100 tablet 0  . buPROPion (WELLBUTRIN) 75 MG tablet TAKE 1 TABLET AT BEDTIME 90 tablet 1  . cloNIDine (CATAPRES) 0.1 MG tablet Take 0.1 mg by mouth at bedtime.     . furosemide (LASIX) 40 MG tablet TAKE 1 TABLET BY MOUTH EVERY MORNING 100 tablet 0  . HYDROcodone-acetaminophen (NORCO/VICODIN) 5-325 MG tablet Take 1-2 tablets by mouth every 4 (four) hours as needed for moderate pain. 10 tablet 0  . levothyroxine (SYNTHROID, LEVOTHROID) 25 MCG tablet Take 25 mcg by mouth daily before  breakfast.    . losartan (COZAAR) 100 MG tablet Take 100 mg by mouth daily.    . Multiple Vitamin (MULITIVITAMIN WITH MINERALS) TABS Take 1 tablet by mouth daily.    . simvastatin (ZOCOR) 40 MG tablet Take 40 mg by mouth daily.     Marland Kitchen topiramate (TOPAMAX) 25 MG tablet Take 25 mg by mouth 2 (two) times daily.    Marland Kitchen venlafaxine XR (EFFEXOR-XR) 150 MG 24 hr capsule TAKE ONE CAPSULE BY MOUTH TWICE A DAY (Patient taking differently: TAKE 2 CAPSULES BY MOUTH IN THE EVENING) 180 capsule 1   No current facility-administered medications on file prior to visit.     LABS/IMAGING: No results found for this or any previous visit (from the past 48 hour(s)). No results found.  LIPID PANEL:    Component Value Date/Time   CHOL 180 03/20/2011 0930   TRIG 156.0 (H) 03/20/2011 0930   HDL 50.60 03/20/2011 0930   CHOLHDL 4 03/20/2011 0930   VLDL 31.2 03/20/2011 0930   LDLCALC 98 03/20/2011 0930    WEIGHTS: Wt Readings from Last 3 Encounters:  11/23/16 219 lb (99.3 kg)  10/24/16 200 lb (90.7 kg)  10/12/16 200 lb (90.7 kg)    VITALS: BP (!) 152/82   Pulse 73   Ht 5' 2.5" (1.588 m)   Wt 219 lb (99.3 kg)   BMI 39.42 kg/m   EXAM: General appearance: alert, no distress and moderately obese Neck: no carotid bruit, no JVD and thyroid not enlarged, symmetric, no tenderness/mass/nodules Lungs: clear to auscultation bilaterally Heart: regular rate and rhythm Abdomen: soft, non-tender; bowel sounds normal; no masses,  no organomegaly Extremities: extremities normal, atraumatic, no cyanosis or edema Pulses: 2+ and symmetric Skin: Skin color, texture, turgor normal. No rashes or lesions Neurologic: Grossly normal Psych: Pleasant  EKG: Normal sinus rhythm at 73, voltage criteria for LVH, nonspecific T-wave changes - personally reviewed  ASSESSMENT: 1. Acceptable risk for upcoming cholecystectomy 2. Hypertension 3. Dyslipidemia 4. Moderate obesity 5. Hypothyroidism  PLAN: 1.   Sierra Banks had  a hematocrit bradycardia after her procedure. This could've been related to number of causes including anesthesia, being on a beta blocker and or being hypothyroid. She's now on levothyroxine. She's had no further bradycardia. She's been restarted on her beta blocker. Blood pressure is elevated today however she said she just took her medications. At home generally her blood pressure ranges around 130/90. She may need further titration of her medications. I'll defer to her PCP on this. As she can do more than 4 metabolic: Subjective activity, she would be at low risk for surgery. No further testing is necessary.  Follow-up with me as needed.  Pixie Casino, MD, [place]  Attending Cardiologist  Direct Dial: [phone]  Fax: [phone]  Website:  [email or web address] 11/23/2016, 10:14 AM

## 2016-11-27 ENCOUNTER — Other Ambulatory Visit: Payer: Self-pay | Admitting: General Surgery

## 2016-11-27 ENCOUNTER — Encounter: Payer: Self-pay | Admitting: Interventional Radiology

## 2016-12-04 ENCOUNTER — Other Ambulatory Visit: Payer: Self-pay | Admitting: General Surgery

## 2016-12-06 NOTE — Pre-Procedure Instructions (Signed)
    Sierra Banks  12/06/2016      [id number] [place], [place] [phone] [facility] [address] [place] [address] Phone: [phone] Fax: [phone]    Your procedure is scheduled on Tuesday, Oct. 30th   Report to [facility] Admitting at 5:30AM.   Call this number if you have problems the morning of surgery:  [phone]   Remember:             4-5 days prior to surgery, STOP TAKING any Vitamins, Herbal Supplements, Anti-inflammatories, Blood Thinners.   Do not eat food or drink liquids after midnight Monday.   Take these medicines the morning of surgery with A SIP OF WATER : Norvasc, Tenormin, Synthroid.   Do not wear jewelry, make-up or nail polish.  Do not wear lotions, powders, perfumes, or deoderant.  Do not shave 48 hours prior to surgery.     Do not bring valuables to the hospital.  [facility] is not responsible for any belongings or valuables.  Contacts, dentures or bridgework may not be worn into surgery.  Leave your suitcase in the car.  After surgery it may be brought to your room.  For patients admitted to the hospital, discharge time will be determined by your treatment team.  Patients discharged the day of surgery will not be allowed to drive home and will need someone to stay with you for the first 24hrs.  Please read over the following fact sheets that you were given. Pain Booklet and Surgical Site Infection Prevention

## 2016-12-07 ENCOUNTER — Encounter (HOSPITAL_COMMUNITY): Payer: Self-pay

## 2016-12-07 ENCOUNTER — Encounter (HOSPITAL_COMMUNITY)
Admission: RE | Admit: 2016-12-07 | Discharge: 2016-12-07 | Disposition: A | Discharge status: Home / Self Care | Payer: Medicare Other | Source: Ambulatory Visit | Attending: General Surgery | Admitting: General Surgery

## 2016-12-07 DIAGNOSIS — Z01812 Encounter for preprocedural laboratory examination: Secondary | ICD-10-CM | POA: Insufficient documentation

## 2016-12-07 HISTORY — DX: Hypothyroidism, unspecified: E03.9

## 2016-12-07 LAB — COMPREHENSIVE METABOLIC PANEL
ALT: 23 U/L (ref 14–54)
AST: 26 U/L (ref 15–41)
Albumin: 4 g/dL (ref 3.5–5.0)
Alkaline Phosphatase: 90 U/L (ref 38–126)
Anion gap: 9 (ref 5–15)
BUN: 8 mg/dL (ref 6–20)
CO2: 25 mmol/L (ref 22–32)
Calcium: 9.4 mg/dL (ref 8.9–10.3)
Chloride: 105 mmol/L (ref 101–111)
Creatinine, Ser: 1.16 mg/dL — ABNORMAL HIGH (ref 0.44–1.00)
GFR calc Af Amer: 55 mL/min — ABNORMAL LOW (ref >60)
GFR calc non Af Amer: 48 mL/min — ABNORMAL LOW (ref >60)
Glucose, Bld: 120 mg/dL — ABNORMAL HIGH (ref 65–99)
Potassium: 3.4 mmol/L — ABNORMAL LOW (ref 3.5–5.1)
Sodium: 139 mmol/L (ref 135–145)
Total Bilirubin: 0.5 mg/dL (ref 0.3–1.2)
Total Protein: 7.1 g/dL (ref 6.5–8.1)

## 2016-12-07 LAB — CBC WITH DIFFERENTIAL/PLATELET
Basophils Absolute: 0 10*3/uL (ref 0.0–0.1)
Basophils Relative: 1 %
Eosinophils Absolute: 0.2 10*3/uL (ref 0.0–0.7)
Eosinophils Relative: 3 %
HCT: 39.5 % (ref 36.0–46.0)
Hemoglobin: 12.8 g/dL (ref 12.0–15.0)
Lymphocytes Relative: 26 %
Lymphs Abs: 2.2 10*3/uL (ref 0.7–4.0)
MCH: 28.1 pg (ref 26.0–34.0)
MCHC: 32.4 g/dL (ref 30.0–36.0)
MCV: 86.8 fL (ref 78.0–100.0)
Monocytes Absolute: 0.5 10*3/uL (ref 0.1–1.0)
Monocytes Relative: 6 %
Neutro Abs: 5.7 10*3/uL (ref 1.7–7.7)
Neutrophils Relative %: 66 %
Platelets: 299 10*3/uL (ref 150–400)
RBC: 4.55 MIL/uL (ref 3.87–5.11)
RDW: 14.7 % (ref 11.5–15.5)
WBC: 8.6 10*3/uL (ref 4.0–10.5)

## 2016-12-07 NOTE — Progress Notes (Signed)
   12/07/16 1419  OBSTRUCTIVE SLEEP APNEA  Have you ever been diagnosed with sleep apnea through a sleep study? No  Do you snore loudly (loud enough to be heard through closed doors)?  1  Do you often feel tired, fatigued, or sleepy during the daytime (such as falling asleep during driving or talking to someone)? 1 (?? d/t the hypo thyroid)  Has anyone observed you stop breathing during your sleep? 0  Do you have, or are you being treated for high blood pressure? 1  BMI more than 35 kg/m2? 1  Age > 50 (1-yes) 1  Neck circumference greater than:Female 16 inches or larger, Female 17inches or larger? 1  Female Gender (Yes=1) 0  Obstructive Sleep Apnea Score 6

## 2016-12-07 NOTE — Progress Notes (Signed)
PCP is Dr. America Brown.   She was just hospitalized for renal cyst embolization.  After her procedure, her heartrate went down into the 30's.  Denies any murmur or cardiac tests. She was sent to see Dr. Debara Pickett for cardiac clearance.  Note in epic.

## 2016-12-12 ENCOUNTER — Encounter (HOSPITAL_COMMUNITY)
Admission: RE | Disposition: A | Discharge status: Home / Self Care | Payer: Self-pay | Source: Ambulatory Visit | Attending: General Surgery

## 2016-12-12 ENCOUNTER — Ambulatory Visit (HOSPITAL_COMMUNITY): Payer: Medicare Other | Admitting: Anesthesiology

## 2016-12-12 ENCOUNTER — Ambulatory Visit (HOSPITAL_COMMUNITY)
Admission: RE | Admit: 2016-12-12 | Discharge: 2016-12-12 | Disposition: A | Discharge status: Home / Self Care | Payer: Medicare Other | Source: Ambulatory Visit | Attending: General Surgery | Admitting: General Surgery

## 2016-12-12 ENCOUNTER — Encounter (HOSPITAL_COMMUNITY): Payer: Self-pay | Admitting: *Deleted

## 2016-12-12 DIAGNOSIS — I10 Essential (primary) hypertension: Secondary | ICD-10-CM | POA: Insufficient documentation

## 2016-12-12 DIAGNOSIS — K219 Gastro-esophageal reflux disease without esophagitis: Secondary | ICD-10-CM | POA: Diagnosis not present

## 2016-12-12 DIAGNOSIS — E039 Hypothyroidism, unspecified: Secondary | ICD-10-CM | POA: Insufficient documentation

## 2016-12-12 DIAGNOSIS — K8010 Calculus of gallbladder with chronic cholecystitis without obstruction: Secondary | ICD-10-CM | POA: Insufficient documentation

## 2016-12-12 DIAGNOSIS — Z79890 Hormone replacement therapy (postmenopausal): Secondary | ICD-10-CM | POA: Diagnosis not present

## 2016-12-12 DIAGNOSIS — K8020 Calculus of gallbladder without cholecystitis without obstruction: Secondary | ICD-10-CM | POA: Diagnosis not present

## 2016-12-12 DIAGNOSIS — E7800 Pure hypercholesterolemia, unspecified: Secondary | ICD-10-CM | POA: Diagnosis not present

## 2016-12-12 DIAGNOSIS — Z79899 Other long term (current) drug therapy: Secondary | ICD-10-CM | POA: Diagnosis not present

## 2016-12-12 HISTORY — PX: CHOLECYSTECTOMY: SHX55

## 2016-12-12 SURGERY — LAPAROSCOPIC CHOLECYSTECTOMY
Anesthesia: General | Site: Abdomen

## 2016-12-12 MED ORDER — OXYCODONE HCL 5 MG PO TABS: 5.0000 mg | ORAL_TABLET | Freq: Once | ORAL | Status: DC | PRN

## 2016-12-12 MED ORDER — SODIUM CHLORIDE 0.9 % IV SOLN: 250.0000 mL | INTRAVENOUS | Status: DC | PRN

## 2016-12-12 MED ORDER — DEXAMETHASONE SODIUM PHOSPHATE 10 MG/ML IJ SOLN
INTRAMUSCULAR | Status: AC
  Filled 2016-12-12: qty 1

## 2016-12-12 MED ORDER — ACETAMINOPHEN 500 MG PO TABS
1000.0000 mg | ORAL_TABLET | ORAL | Status: AC
  Administered 2016-12-12: 1000 mg via ORAL
  Filled 2016-12-12: qty 2

## 2016-12-12 MED ORDER — LACTATED RINGERS IV SOLN
INTRAVENOUS | Status: DC | PRN
  Administered 2016-12-12: 07:00:00 via INTRAVENOUS

## 2016-12-12 MED ORDER — PROPOFOL 10 MG/ML IV BOLUS
INTRAVENOUS | Status: AC
  Filled 2016-12-12: qty 20

## 2016-12-12 MED ORDER — MORPHINE SULFATE (PF) 2 MG/ML IV SOLN: 2.0000 mg | INTRAVENOUS | Status: DC | PRN

## 2016-12-12 MED ORDER — SODIUM CHLORIDE 0.9% FLUSH: 3.0000 mL | INTRAVENOUS | Status: DC | PRN

## 2016-12-12 MED ORDER — ONDANSETRON HCL 4 MG/2ML IJ SOLN
INTRAMUSCULAR | Status: AC
  Filled 2016-12-12: qty 2

## 2016-12-12 MED ORDER — LIDOCAINE HCL (CARDIAC) 20 MG/ML IV SOLN
INTRAVENOUS | Status: DC | PRN
  Administered 2016-12-12: 80 mg via INTRAVENOUS

## 2016-12-12 MED ORDER — DEXAMETHASONE SODIUM PHOSPHATE 10 MG/ML IJ SOLN
INTRAMUSCULAR | Status: DC | PRN
  Administered 2016-12-12: 10 mg via INTRAVENOUS

## 2016-12-12 MED ORDER — ACETAMINOPHEN 325 MG PO TABS: 650.0000 mg | ORAL_TABLET | ORAL | Status: DC | PRN

## 2016-12-12 MED ORDER — CEFAZOLIN SODIUM-DEXTROSE 2-4 GM/100ML-% IV SOLN
2.0000 g | INTRAVENOUS | Status: AC
  Administered 2016-12-12: 2 g via INTRAVENOUS
  Filled 2016-12-12: qty 100

## 2016-12-12 MED ORDER — SUGAMMADEX SODIUM 500 MG/5ML IV SOLN
INTRAVENOUS | Status: DC | PRN
  Administered 2016-12-12: 393.6 mg via INTRAVENOUS

## 2016-12-12 MED ORDER — SODIUM CHLORIDE 0.9 % IR SOLN
Status: DC | PRN
  Administered 2016-12-12: 1000 mL

## 2016-12-12 MED ORDER — ROCURONIUM BROMIDE 10 MG/ML (PF) SYRINGE
PREFILLED_SYRINGE | INTRAVENOUS | Status: AC
  Filled 2016-12-12: qty 5

## 2016-12-12 MED ORDER — FENTANYL CITRATE (PF) 250 MCG/5ML IJ SOLN
INTRAMUSCULAR | Status: AC
  Filled 2016-12-12: qty 5

## 2016-12-12 MED ORDER — GLYCOPYRROLATE 0.2 MG/ML IJ SOLN
INTRAMUSCULAR | Status: DC | PRN
  Administered 2016-12-12: 0.4 mg via INTRAVENOUS

## 2016-12-12 MED ORDER — HYDROMORPHONE HCL 1 MG/ML IJ SOLN: 0.2500 mg | INTRAMUSCULAR | Status: DC | PRN

## 2016-12-12 MED ORDER — SODIUM CHLORIDE 0.9 % IV SOLN: INTRAVENOUS | Status: DC

## 2016-12-12 MED ORDER — MIDAZOLAM HCL 5 MG/5ML IJ SOLN
INTRAMUSCULAR | Status: DC | PRN
  Administered 2016-12-12: 2 mg via INTRAVENOUS

## 2016-12-12 MED ORDER — ROCURONIUM BROMIDE 100 MG/10ML IV SOLN
INTRAVENOUS | Status: DC | PRN
  Administered 2016-12-12: 40 mg via INTRAVENOUS

## 2016-12-12 MED ORDER — 0.9 % SODIUM CHLORIDE (POUR BTL) OPTIME
TOPICAL | Status: DC | PRN
  Administered 2016-12-12: 1000 mL

## 2016-12-12 MED ORDER — ONDANSETRON HCL 4 MG/2ML IJ SOLN
INTRAMUSCULAR | Status: DC | PRN
Start: 2016-12-12 — End: 2016-12-12
  Administered 2016-12-12: 4 mg via INTRAVENOUS

## 2016-12-12 MED ORDER — OXYCODONE HCL 5 MG/5ML PO SOLN: 5.0000 mg | Freq: Once | ORAL | Status: DC | PRN

## 2016-12-12 MED ORDER — PROPOFOL 10 MG/ML IV BOLUS
INTRAVENOUS | Status: DC | PRN
  Administered 2016-12-12: 150 mg via INTRAVENOUS
  Administered 2016-12-12: 30 mg via INTRAVENOUS

## 2016-12-12 MED ORDER — MIDAZOLAM HCL 2 MG/2ML IJ SOLN
INTRAMUSCULAR | Status: AC
  Filled 2016-12-12: qty 2

## 2016-12-12 MED ORDER — SUCCINYLCHOLINE CHLORIDE 20 MG/ML IJ SOLN
INTRAMUSCULAR | Status: DC | PRN
  Administered 2016-12-12: 160 mg via INTRAVENOUS

## 2016-12-12 MED ORDER — EPHEDRINE SULFATE 50 MG/ML IJ SOLN
INTRAMUSCULAR | Status: DC | PRN
  Administered 2016-12-12: 5 mg via INTRAVENOUS
  Administered 2016-12-12: 10 mg via INTRAVENOUS
  Administered 2016-12-12: 5 mg via INTRAVENOUS

## 2016-12-12 MED ORDER — BUPIVACAINE-EPINEPHRINE (PF) 0.25% -1:200000 IJ SOLN
INTRAMUSCULAR | Status: AC
  Filled 2016-12-12: qty 30

## 2016-12-12 MED ORDER — HYDROCODONE-ACETAMINOPHEN 10-325 MG PO TABS: 1.0000 | ORAL_TABLET | Freq: Four times a day (QID) | ORAL | 0 refills | Status: AC | PRN

## 2016-12-12 MED ORDER — LIDOCAINE 2% (20 MG/ML) 5 ML SYRINGE
INTRAMUSCULAR | Status: AC
  Filled 2016-12-12: qty 5

## 2016-12-12 MED ORDER — SODIUM CHLORIDE 0.9% FLUSH: 3.0000 mL | Freq: Two times a day (BID) | INTRAVENOUS | Status: DC

## 2016-12-12 MED ORDER — FENTANYL CITRATE (PF) 100 MCG/2ML IJ SOLN
INTRAMUSCULAR | Status: DC | PRN
  Administered 2016-12-12 (×2): 50 ug via INTRAVENOUS
  Administered 2016-12-12: 150 ug via INTRAVENOUS

## 2016-12-12 MED ORDER — ACETAMINOPHEN 650 MG RE SUPP: 650.0000 mg | RECTAL | Status: DC | PRN

## 2016-12-12 MED ORDER — EPHEDRINE 5 MG/ML INJ
INTRAVENOUS | Status: AC
  Filled 2016-12-12: qty 10

## 2016-12-12 MED ORDER — GABAPENTIN 300 MG PO CAPS
300.0000 mg | ORAL_CAPSULE | ORAL | Status: AC
  Administered 2016-12-12: 300 mg via ORAL
  Filled 2016-12-12: qty 1

## 2016-12-12 MED ORDER — BUPIVACAINE-EPINEPHRINE 0.25% -1:200000 IJ SOLN
INTRAMUSCULAR | Status: DC | PRN
  Administered 2016-12-12: 12 mL

## 2016-12-12 MED ORDER — ONDANSETRON HCL 4 MG/2ML IJ SOLN: 4.0000 mg | Freq: Four times a day (QID) | INTRAMUSCULAR | Status: DC | PRN

## 2016-12-12 MED ORDER — ATROPINE SULFATE 0.4 MG/ML IJ SOLN
INTRAMUSCULAR | Status: DC | PRN
  Administered 2016-12-12: 0.2 mg via INTRAVENOUS

## 2016-12-12 MED ORDER — OXYCODONE HCL 5 MG PO TABS
ORAL_TABLET | ORAL | Status: AC
  Administered 2016-12-12: 5 mg
  Filled 2016-12-12: qty 1

## 2016-12-12 MED ORDER — SUCCINYLCHOLINE CHLORIDE 200 MG/10ML IV SOSY
PREFILLED_SYRINGE | INTRAVENOUS | Status: AC
  Filled 2016-12-12: qty 10

## 2016-12-12 MED ORDER — OXYCODONE HCL 5 MG PO TABS: 5.0000 mg | ORAL_TABLET | ORAL | Status: DC | PRN

## 2016-12-12 SURGICAL SUPPLY — 47 items
ADH SKN CLS APL DERMABOND .7 (GAUZE/BANDAGES/DRESSINGS) ×1
APPLIER CLIP 5 13 M/L LIGAMAX5 (MISCELLANEOUS) ×2
APR CLP MED LRG 5 ANG JAW (MISCELLANEOUS) ×1
BAG SPEC RTRVL 10 TROC 200 (ENDOMECHANICALS) ×1
CANISTER SUCT 3000ML PPV (MISCELLANEOUS) ×2 IMPLANT
CHLORAPREP W/TINT 26ML (MISCELLANEOUS) ×2 IMPLANT
CLIP APPLIE 5 13 M/L LIGAMAX5 (MISCELLANEOUS) ×1 IMPLANT
COVER SURGICAL LIGHT HANDLE (MISCELLANEOUS) ×2 IMPLANT
DERMABOND ADVANCED (GAUZE/BANDAGES/DRESSINGS) ×1
DERMABOND ADVANCED .7 DNX12 (GAUZE/BANDAGES/DRESSINGS) ×1 IMPLANT
DEVICE TROCAR PUNCTURE CLOSURE (ENDOMECHANICALS) ×2 IMPLANT
ELECT REM PT RETURN 9FT ADLT (ELECTROSURGICAL) ×2
ELECTRODE REM PT RTRN 9FT ADLT (ELECTROSURGICAL) ×1 IMPLANT
ENDOLOOP SUT PDS II  0 18 (SUTURE) ×1
ENDOLOOP SUT PDS II 0 18 (SUTURE) IMPLANT
GLOVE BIO SURGEON STRL SZ7 (GLOVE) ×3 IMPLANT
GLOVE BIO SURGEON STRL SZ7.5 (GLOVE) ×1 IMPLANT
GLOVE BIOGEL PI IND STRL 6 (GLOVE) IMPLANT
GLOVE BIOGEL PI IND STRL 7.0 (GLOVE) IMPLANT
GLOVE BIOGEL PI IND STRL 7.5 (GLOVE) ×1 IMPLANT
GLOVE BIOGEL PI INDICATOR 6 (GLOVE) ×1
GLOVE BIOGEL PI INDICATOR 7.0 (GLOVE) ×1
GLOVE BIOGEL PI INDICATOR 7.5 (GLOVE) ×2
GLOVE ECLIPSE 8.0 STRL XLNG CF (GLOVE) ×1 IMPLANT
GOWN STRL REUS W/ TWL LRG LVL3 (GOWN DISPOSABLE) ×3 IMPLANT
GOWN STRL REUS W/ TWL XL LVL3 (GOWN DISPOSABLE) IMPLANT
GOWN STRL REUS W/TWL LRG LVL3 (GOWN DISPOSABLE) ×4
GOWN STRL REUS W/TWL XL LVL3 (GOWN DISPOSABLE) ×4
KIT BASIN OR (CUSTOM PROCEDURE TRAY) ×2 IMPLANT
KIT ROOM TURNOVER OR (KITS) ×2 IMPLANT
NS IRRIG 1000ML POUR BTL (IV SOLUTION) ×2 IMPLANT
PAD ARMBOARD 7.5X6 YLW CONV (MISCELLANEOUS) ×2 IMPLANT
POUCH RETRIEVAL ECOSAC 10 (ENDOMECHANICALS) ×1 IMPLANT
POUCH RETRIEVAL ECOSAC 10MM (ENDOMECHANICALS) ×1
SCISSORS LAP 5X35 DISP (ENDOMECHANICALS) ×2 IMPLANT
SET IRRIG TUBING LAPAROSCOPIC (IRRIGATION / IRRIGATOR) ×2 IMPLANT
SLEEVE ENDOPATH XCEL 5M (ENDOMECHANICALS) ×4 IMPLANT
SPECIMEN JAR SMALL (MISCELLANEOUS) ×2 IMPLANT
STRIP CLOSURE SKIN 1/2X4 (GAUZE/BANDAGES/DRESSINGS) ×2 IMPLANT
SUT MNCRL AB 4-0 PS2 18 (SUTURE) ×2 IMPLANT
SUT VICRYL 0 UR6 27IN ABS (SUTURE) ×2 IMPLANT
TOWEL OR 17X24 6PK STRL BLUE (TOWEL DISPOSABLE) ×2 IMPLANT
TOWEL OR 17X26 10 PK STRL BLUE (TOWEL DISPOSABLE) ×2 IMPLANT
TRAY LAPAROSCOPIC MC (CUSTOM PROCEDURE TRAY) ×2 IMPLANT
TROCAR XCEL BLUNT TIP 100MML (ENDOMECHANICALS) ×2 IMPLANT
TROCAR XCEL NON-BLD 5MMX100MML (ENDOMECHANICALS) ×2 IMPLANT
TUBING INSUFFLATION (TUBING) ×2 IMPLANT

## 2016-12-12 NOTE — Anesthesia Postprocedure Evaluation (Signed)
Anesthesia Post Note  Patient: Sierra Banks  Procedure(s) Performed: LAPAROSCOPIC CHOLECYSTECTOMY (N/A Abdomen)     Patient location during evaluation: PACU Anesthesia Type: General Level of consciousness: awake and alert Pain management: pain level controlled Vital Signs Assessment: post-procedure vital signs reviewed and stable Respiratory status: spontaneous breathing, nonlabored ventilation, respiratory function stable and patient connected to nasal cannula oxygen Cardiovascular status: blood pressure returned to baseline and stable Postop Assessment: no apparent nausea or vomiting Anesthetic complications: no    Last Vitals:  Vitals:   12/12/16 0951 12/12/16 1002  BP: (!) 171/91   Pulse: 62 66  Resp: 11 14  Temp:    SpO2: 95% 91%    Last Pain:  Vitals:   12/12/16 1002  TempSrc:   PainSc: [name]

## 2016-12-12 NOTE — Transfer of Care (Signed)
Immediate Anesthesia Transfer of Care Note  Patient: Sierra Banks  Procedure(s) Performed: LAPAROSCOPIC CHOLECYSTECTOMY (N/A Abdomen)  Patient Location: PACU  Anesthesia Type:General  Level of Consciousness: awake, alert  and oriented  Airway & Oxygen Therapy: Patient Spontanous Breathing and Patient connected to face mask oxygen  Post-op Assessment: Report given to RN and Post -op Vital signs reviewed and stable  Post vital signs: Reviewed and stable  Last Vitals:  Vitals:   12/12/16 0635  BP: (!) 141/76  Pulse: (!) 53  Resp: 18  Temp: 36.8 C  SpO2: 97%    Last Pain:  Vitals:   12/12/16 0635  TempSrc: Oral         Complications: No apparent anesthesia complications

## 2016-12-12 NOTE — H&P (Signed)
66 yof referred by Dr Alinda Money for gallstones. originally was planned to have right lap nephrectomy but has recently undergone embolization. this was complicated by asymptomatic bradycardia that was monitored. this has occurred after other procedures in past as well. she for evaluation for abd pain in february had a ct scan that showed a renal stone and the right kidney lesion. there is also cholelithiasis. there are gallstones also on mri in 7/18 as well as a nother ct scan in 9/18. over past couple months she has begun to have ruq pain associated with nausea after eating. this was daily but with some diet change it is less frequent. she is still having it. she is here to discuss gallstones   Past Surgical History (Tanisha A. Owens Shark, [place]; 11/20/2016 2:26 PM) Knee Surgery  Left. Tonsillectomy   Diagnostic Studies History (Tanisha A. Owens Shark, [place]; 11/20/2016 2:26 PM) Colonoscopy  1-5 years ago Mammogram  within last year Pap Smear  1-5 years ago  Allergies (Tanisha A. Owens Shark, [place]; 11/20/2016 2:27 PM) No Known Drug Allergies 11/20/2016 Allergies Reconciled   Medication History (Tanisha A. Owens Shark, [place]; 11/20/2016 2:27 PM) Atenolol (50MG  Tablet, Oral) Active. BuPROPion HCl (75MG  Tablet, Oral) Active. CloNIDine HCl (0.1MG  Tablet, Oral) Active. Losartan Potassium (100MG  Tablet, Oral) Active. Synthroid (50MCG Tablet, Oral) Active. Venlafaxine HCl ER (150MG  Capsule ER 24HR, Oral) Active. Topiramate (25MG  Tablet, Oral) Active. Medications Reconciled  Social History (Tanisha A. Owens Shark, [place]; 11/20/2016 2:26 PM) Alcohol use  Occasional alcohol use. Caffeine use  Carbonated beverages, Coffee, Tea. No drug use  Tobacco use  Never smoker.  Family History (Tanisha A. Owens Shark, [place]; 11/20/2016 2:26 PM) Arthritis  Father, Mother. Breast Cancer  Sister. Colon Cancer  Family Members In General. Colon Polyps  Family Members In General, Sister. Depression  Mother, Sister. Heart Disease   Mother. Hypertension  Family Members In General, Mother. Kidney Disease  Family Members In General, Father, Mother. Respiratory Condition  Mother, Sister. Thyroid problems  Mother, Sister.  Pregnancy / Birth History (Tanisha A. Owens Shark, [place]; 11/20/2016 2:26 PM) Age at menarche  48 years. Age of menopause  47-55 Gravida  2 Length (months) of breastfeeding  3-6 Maternal age  5-25 Para  2  Other Problems (Tanisha A. Owens Shark, [place]; 11/20/2016 2:26 PM) Cholelithiasis  Gastroesophageal Reflux Disease  High blood pressure  Hypercholesterolemia  Kidney Stone  Thyroid Disease   Review of Systems (Tanisha A. Brown RMA; 11/20/2016 2:26 PM) General Present- Fatigue. Not Present- Appetite Loss, Chills, Fever, Night Sweats, Weight Gain and Weight Loss. Skin Not Present- Change in Wart/Mole, Dryness, Hives, Jaundice, New Lesions, Non-Healing Wounds, Rash and Ulcer. HEENT Present- Wears glasses/contact lenses. Not Present- Earache, Hearing Loss, Hoarseness, Nose Bleed, Oral Ulcers, Ringing in the Ears, Seasonal Allergies, Sinus Pain, Sore Throat, Visual Disturbances and Yellow Eyes. Respiratory Present- Snoring. Not Present- Bloody sputum, Chronic Cough, Difficulty Breathing and Wheezing. Breast Not Present- Breast Mass, Breast Pain, Nipple Discharge and Skin Changes. Cardiovascular Not Present- Chest Pain, Difficulty Breathing Lying Down, Leg Cramps, Palpitations, Rapid Heart Rate, Shortness of Breath and Swelling of Extremities. Gastrointestinal Present- Abdominal Pain, Indigestion and Nausea. Not Present- Bloating, Bloody Stool, Change in Bowel Habits, Chronic diarrhea, Constipation, Difficulty Swallowing, Excessive gas, Gets full quickly at meals, Hemorrhoids, Rectal Pain and Vomiting. Female Genitourinary Not Present- Frequency, Nocturia, Painful Urination, Pelvic Pain and Urgency. Musculoskeletal Not Present- Back Pain, Joint Pain, Joint Stiffness, Muscle Pain, Muscle Weakness and  Swelling of Extremities. Neurological Not Present- Decreased Memory, Fainting, Headaches, Numbness, Seizures, Tingling, Tremor, Trouble  walking and Weakness. Psychiatric Not Present- Anxiety, Bipolar, Change in Sleep Pattern, Depression, Fearful and Frequent crying. Endocrine Not Present- Cold Intolerance, Excessive Hunger, Hair Changes, Heat Intolerance, Hot flashes and New Diabetes. Hematology Not Present- Blood Thinners, Easy Bruising, Excessive bleeding, Gland problems, HIV and Persistent Infections.  Vitals (Tanisha A. Brown RMA; 11/20/2016 2:27 PM) 11/20/2016 2:27 PM Weight: 216.8 lb Height: 64in Body Surface Area: 2.02 m Body Mass Index: 37.21 kg/m  Temp.: 98.71F  Pulse: 63 (Regular)  BP: 132/84 (Sitting, Left Arm, Standard) Physical Exam Rolm Bookbinder MD; 11/20/2016 2:59 PM) General Mental Status-Alert. Orientation-Oriented X3. Head and Neck Trachea-midline. Thyroid Gland Characteristics - normal size and consistency. Eye Sclera/Conjunctiva - Bilateral-No scleral icterus. Chest and Lung Exam Chest and lung exam reveals -quiet, even and easy respiratory effort with no use of accessory muscles and on auscultation, normal breath sounds, no adventitious sounds and normal vocal resonance. Cardiovascular Cardiovascular examination reveals -normal heart sounds, regular rate and rhythm with no murmurs. Abdomen Note: soft/nt/nd well healed infraumbilical incision Lymphatic Note: no cervical adenopathy  Assessment & Plan Rolm Bookbinder MD; 11/20/2016 3:00 PM) GALLSTONES (K80.20) Story: Laparoscopic cholecystectomy Will have her see cardiology first for evaluation given bradycardia after embolization I discussed the procedure in detail. The patient was given Neurosurgeon. We discussed the risks and benefits of a laparoscopic cholecystectomy and possible cholangiogram including, but not limited to bleeding, infection, injury to surrounding  structures such as the intestine or liver, bile leak, retained gallstones, need to convert to an open procedure, prolonged diarrhea, blood clots such as DVT, common bile duct injury, anesthesia risks, and possible need for additional procedures. The likelihood of improvement in symptoms and return to the patient's normal status is good. We discussed the typical post-operative recovery course.

## 2016-12-12 NOTE — Anesthesia Preprocedure Evaluation (Signed)
Anesthesia Evaluation  Patient identified by MRN, date of birth, ID band Patient awake    Reviewed: Allergy & Precautions, H&P , NPO status , Patient's Chart, lab work & pertinent test results  Airway Mallampati: II   Neck ROM: full    Dental   Pulmonary neg pulmonary ROS,    breath sounds clear to auscultation       Cardiovascular hypertension,  Rhythm:regular Rate:Normal     Neuro/Psych PSYCHIATRIC DISORDERS Depression    GI/Hepatic GERD  ,  Endo/Other  Hypothyroidism obese  Renal/GU stones     Musculoskeletal   Abdominal   Peds  Hematology   Anesthesia Other Findings   Reproductive/Obstetrics                             Anesthesia Physical Anesthesia Plan  ASA: II  Anesthesia Plan: General   Post-op Pain Management:    Induction: Intravenous  PONV Risk Score and Plan: 3 and Ondansetron, Dexamethasone, Midazolam and Treatment may vary due to age or medical condition  Airway Management Planned: Oral ETT  Additional Equipment:   Intra-op Plan:   Post-operative Plan: Extubation in OR  Informed Consent: I have reviewed the patients History and Physical, chart, labs and discussed the procedure including the risks, benefits and alternatives for the proposed anesthesia with the patient or authorized representative who has indicated his/her understanding and acceptance.     Plan Discussed with: CRNA, Anesthesiologist and Surgeon  Anesthesia Plan Comments:         Anesthesia Quick Evaluation

## 2016-12-12 NOTE — Discharge Instructions (Signed)
[facility] LAPAROSCOPIC SURGERY: POST OP INSTRUCTIONS  Always review your discharge instruction sheet given to you by the facility where your surgery was performed. IF YOU HAVE DISABILITY OR FAMILY LEAVE FORMS, YOU MUST BRING THEM TO THE OFFICE FOR PROCESSING.   DO NOT GIVE THEM TO YOUR DOCTOR.  1. A prescription for pain medication may be given to you upon discharge.  Take your pain medication as prescribed, if needed.  If narcotic pain medicine is not needed, then you may take acetaminophen (Tylenol), naprosyn (Alleve), or ibuprofen (Advil) as needed. 2. Take your usually prescribed medications unless otherwise directed. 3. If you need a refill on your pain medication, please contact your pharmacy.  They will contact our office to request authorization. Prescriptions will not be filled after 5pm or on week-ends. 4. You should follow a light diet the first few days after arrival home, such as soup and crackers, etc.  Be sure to include lots of fluids daily. 5. Most patients will experience some swelling and bruising in the area of the incisions.  Ice packs will help.  Swelling and bruising can take several days to resolve.  6. It is common to experience some constipation if taking pain medication after surgery.  Increasing fluid intake and taking a stool softener (such as Colace) will usually help or prevent this problem from occurring.  A mild laxative (Milk of Magnesia or Miralax) should be taken according to package instructions if there are no bowel movements after 48 hours. 7. Unless discharge instructions indicate otherwise, you may remove your bandages 48 hours after surgery, and you may shower at that time.  You may have steri-strips (small skin tapes) in place directly over the incision.  These strips should be left on the skin for 7-10 days.  If your surgeon used skin glue on the incision, you may shower in 24 hours.  The glue will flake  off over the next 2-3 weeks.  Any sutures or staples will be removed at the office during your follow-up visit. 8. ACTIVITIES:  You may resume regular (light) daily activities beginning the next day--such as daily self-care, walking, climbing stairs--gradually increasing activities as tolerated.  You may have sexual intercourse when it is comfortable.  Refrain from any heavy lifting or straining until approved by your doctor. a. You may drive when you are no longer taking prescription pain medication, you can comfortably wear a seatbelt, and you can safely maneuver your car and apply brakes. b. RETURN TO WORK:  __________________________________________________________ 9. You should see your doctor in the office for a follow-up appointment approximately 2-3 weeks after your surgery.  Make sure that you call for this appointment within a day or two after you arrive home to insure a convenient appointment time. 10. OTHER INSTRUCTIONS: __________________________________________________________________________________________________________________________ __________________________________________________________________________________________________________________________ WHEN TO CALL YOUR DOCTOR: 1. Fever over 101.0 2. Inability to urinate 3. Continued bleeding from incision. 4. Increased pain, redness, or drainage from the incision. 5. Increasing abdominal pain  The clinic staff is available to answer your questions during regular business hours.  Please don't hesitate to call and ask to speak to one of the nurses for clinical concerns.  If you have a medical emergency, go to the nearest emergency room or call 911.  A surgeon from [facility] is always on call at the hospital. [address], [address], [place], [place]  [address] ? [address] [address], [place], [place]   [address] [phone] ? [phone] ? [phone]   [phone] Web site: [email or web address]  

## 2016-12-12 NOTE — Anesthesia Procedure Notes (Cosign Needed)
Procedure Name: Intubation Date/Time: 12/12/2016 7:35 AM Performed by: Marcie Bal, ADAM Pre-anesthesia Checklist: Patient identified, Emergency Drugs available, Suction available, Patient being monitored and Timeout performed Patient Re-evaluated:Patient Re-evaluated prior to induction Oxygen Delivery Method: Circle system utilized Preoxygenation: Pre-oxygenation with 100% oxygen Induction Type: IV induction and Cricoid Pressure applied Ventilation: Oral airway inserted - appropriate to patient size and Two handed mask ventilation required Laryngoscope Size: Mac and 3 Grade View: Grade I Tube type: Oral Tube size: 7.0 mm Number of attempts: 1 Airway Equipment and Method: Stylet and Oral airway Placement Confirmation: ETT inserted through vocal cords under direct vision,  positive ETCO2,  CO2 detector and breath sounds checked- equal and bilateral Secured at: 21 cm Tube secured with: Tape Dental Injury: Teeth and Oropharynx as per pre-operative assessment

## 2016-12-12 NOTE — Op Note (Signed)
Preoperative diagnosis: biliary colic Postoperative diagnosis: same as above Procedure: Laparoscopic cholecystectomy Surgeon: Dr. Serita Grammes Anesthesia: Gen. Specimens: gb to pathology Estimated blood loss: minimal Complications: None Drains: none Sponge count was correct at completion Disposition to recovery stable  Indications: This is a 27 yof who has symptomatic cholelithiasis. We discussed options and elected to proceed with lap chole.     Procedure: After informed consent was obtained the patient was taken to the operating room. She was givenantibiotics. SCDs were in place. She was placed undergeneral anesthesia without complication. Her abdomen was prepped and draped in the standard sterile surgical fashion. A surgical timeout was then performed.  She had some bradycardia with induction that resolved quickly. I infiltrated marcaine belowthe umbilicus. I made a vertical incision.  I incised the fascia and entered the peritoneum bluntly. There was no evidence of entry inury. I placed a 0 vicryl pursestring suture and inserted a hasson trocar. I insufflated the abdomen to 15 mm Hg pressure.  I then placed a 5 mm trocar in theepigastrium.Two5 mm trocars were placed in the right side of the abdomen. The gallbladder had evidence of chronic cholecystitis. I had to aspirate the gallbladder to grasp it appropriately.I grasped the gallbladder and retracted cephalad and lateral. EventuallyI was able to identify the critical view of safety. The triangle was very inflamed.  The cystic duct was not very long it appeared.  I then clipped the duct and divided it. The duct was viable. The clips traversed the duct. I did decide to place an endoloop around the cystic duct stump as well.  I then clipped and divided the cystic artery. I then removed the gallbladder from the liver bed.  I placed the gallbladder in a bag and removed from the umbilicus. I obtained hemostasis.I then removed the  72mm trocar, tied down the pursestringand closed this with multiple2-0 vicrylsusing the endoclose device.I then removed the remaining trocars and these were closed with 4-0 Monocryl and glue. She tolerated this well be transferred to the recovery room.

## 2016-12-12 NOTE — Interval H&P Note (Signed)
History and Physical Interval Note:  12/12/2016 7:21 AM  Sierra Banks  has presented today for surgery, with the diagnosis of cholelithiasis  The various methods of treatment have been discussed with the patient and family. After consideration of risks, benefits and other options for treatment, the patient has consented to  Procedure(s): LAPAROSCOPIC CHOLECYSTECTOMY (N/A) as a surgical intervention .  The patient's history has been reviewed, patient examined, no change in status, stable for surgery.  I have reviewed the patient's chart and labs.  Questions were answered to the patient's satisfaction.     [name]

## 2016-12-13 ENCOUNTER — Encounter (HOSPITAL_COMMUNITY): Payer: Self-pay | Admitting: General Surgery

## 2016-12-27 ENCOUNTER — Other Ambulatory Visit (HOSPITAL_COMMUNITY): Payer: Self-pay | Admitting: Interventional Radiology

## 2016-12-27 DIAGNOSIS — D1771 Benign lipomatous neoplasm of kidney: Secondary | ICD-10-CM

## 2016-12-28 ENCOUNTER — Other Ambulatory Visit: Payer: Self-pay | Admitting: Radiology

## 2017-01-15 DIAGNOSIS — M67441 Ganglion, right hand: Secondary | ICD-10-CM | POA: Diagnosis not present

## 2017-01-15 DIAGNOSIS — M25541 Pain in joints of right hand: Secondary | ICD-10-CM | POA: Diagnosis not present

## 2017-01-23 ENCOUNTER — Other Ambulatory Visit: Payer: Medicare Other

## 2017-01-23 ENCOUNTER — Ambulatory Visit (HOSPITAL_COMMUNITY): Admission: RE | Admit: 2017-01-23 | Payer: Medicare Other | Source: Ambulatory Visit

## 2017-01-25 DIAGNOSIS — R2231 Localized swelling, mass and lump, right upper limb: Secondary | ICD-10-CM | POA: Diagnosis not present

## 2017-01-29 ENCOUNTER — Ambulatory Visit (HOSPITAL_COMMUNITY)
Admission: RE | Admit: 2017-01-29 | Discharge: 2017-01-29 | Disposition: A | Discharge status: Home / Self Care | Payer: Medicare Other | Source: Ambulatory Visit | Attending: Interventional Radiology | Admitting: Interventional Radiology

## 2017-01-29 DIAGNOSIS — K760 Fatty (change of) liver, not elsewhere classified: Secondary | ICD-10-CM | POA: Diagnosis not present

## 2017-01-29 DIAGNOSIS — D1771 Benign lipomatous neoplasm of kidney: Secondary | ICD-10-CM

## 2017-01-29 LAB — POCT I-STAT CREATININE: Creatinine, Ser: 1.2 mg/dL — ABNORMAL HIGH (ref 0.44–1.00)

## 2017-01-29 MED ORDER — GADOBENATE DIMEGLUMINE 529 MG/ML IV SOLN
20.0000 mL | Freq: Once | INTRAVENOUS | Status: AC | PRN
  Administered 2017-01-29: 19 mL via INTRAVENOUS

## 2017-01-30 DIAGNOSIS — R2231 Localized swelling, mass and lump, right upper limb: Secondary | ICD-10-CM | POA: Diagnosis not present

## 2017-02-15 ENCOUNTER — Ambulatory Visit
Admission: RE | Admit: 2017-02-15 | Discharge: 2017-02-15 | Disposition: A | Discharge status: Home / Self Care | Payer: Medicare Other | Source: Ambulatory Visit | Attending: Interventional Radiology | Admitting: Interventional Radiology

## 2017-02-15 ENCOUNTER — Encounter: Payer: Self-pay | Admitting: Radiology

## 2017-02-15 DIAGNOSIS — D3001 Benign neoplasm of right kidney: Secondary | ICD-10-CM | POA: Diagnosis not present

## 2017-02-15 DIAGNOSIS — Z9889 Other specified postprocedural states: Secondary | ICD-10-CM | POA: Diagnosis not present

## 2017-02-15 DIAGNOSIS — D1771 Benign lipomatous neoplasm of kidney: Secondary | ICD-10-CM

## 2017-02-15 HISTORY — PX: IR RADIOLOGIST EVAL & MGMT: IMG5224

## 2017-02-15 NOTE — Progress Notes (Signed)
Patient ID: Sierra Banks, female   DOB: [birth year], 67 y.o.   MRN: [id number]         Chief Complaint: Patient was seen in consultation today for  Chief Complaint  Patient presents with  . 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 female with past medical history significant for hypertension, hyperlipidemia, obesity, GERD and depression who underwent a technically successful right renal AML embolization on 10/24/2016 and returns today for postprocedural evaluation and management following the acquisition of initial post procedural MRI performed 01/29/2017. The patient is unaccompanied and serves as her own historian.  The patient is currently without complaint. Specifically, no flank pain, fever or chills. No hematuria or dysuria.   Past Medical History:  Diagnosis Date  . ABSCESS, BREAST, RIGHT 10/04/2009  . DEPRESSION 08/28/2006  . Dysrhythmia    HEART PALPITATIONS   . GERD (gastroesophageal reflux disease)    OCC TUMS   . Hepatitis    JAUNDICE AGE 4  . 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 History of kidney stones 04/05/2016  . HYPERLIPIDEMIA 12/23/2007  . HYPERTENSION 08/28/2006  . Hypothyroidism    not as tired as she was  . Overweight(278.02) 01/12/2009    Past Surgical History:  Procedure Laterality Date  . CARPAL TUNNEL RELEASE     LEFT  2002  . CHOLECYSTECTOMY N/A 12/12/2016   Procedure: LAPAROSCOPIC CHOLECYSTECTOMY;  Surgeon: Rolm Bookbinder, MD;  Location: [facility];  Service: General;  Laterality: N/A;  . DILATION AND CURETTAGE OF UTERUS     2011  . fatty tumor removed from right arm  2016  . IR EMBO TUMOR ORGAN ISCHEMIA INFARCT INC GUIDE ROADMAPPING  10/24/2016  . IR RADIOLOGIST EVAL & MGMT  10/12/2016  . IR RADIOLOGIST EVAL & MGMT  11/16/2016  . IR RENAL SUPRASEL UNI S&I MOD SED  10/24/2016  . IR RENAL SUPRASEL UNI S&I MOD SED   10/24/2016  . IR US GUIDE VASC ACCESS RIGHT  10/24/2016  . JOINT REPLACEMENT Left   . MASS EXCISION Right 08/21/2014   Procedure: RIGHT FOREARM MASS EXCISION WITH REPAIR AND RECONSTRUCTION;  Surgeon: Roseanne Kaufman, MD;  Location: [facility];  Service: Orthopedics;  Laterality: Right;  . TONSILLECTOMY     1969  . TOTAL KNEE ARTHROPLASTY  05/08/2011   Procedure: TOTAL KNEE ARTHROPLASTY;  Surgeon: Rudean Haskell, MD;  Location: [facility];  Service: Orthopedics;  Laterality: Left;  . TUBAL LIGATION     1980    Allergies: Patient has no known allergies.  Medications: Prior to Admission medications   Medication Sig Start Date End Date Taking? Authorizing Provider  amLODipine (NORVASC) 10 MG tablet TAKE 1 TABLET BY MOUTH EVERY MORNING Patient taking differently: Take 5 mg by mouth every morning 04/11/12  Yes Dorena Cookey, MD  atenolol (TENORMIN) 50 MG tablet Take 100 mg by mouth daily.    Yes [provider]  buPROPion (WELLBUTRIN) 75 MG tablet TAKE 1 TABLET AT BEDTIME Patient taking differently: TAKE 75 MG BY MOUTH AT BEDTIME 09/20/12  Yes Dorena Cookey, MD  cloNIDine (CATAPRES) 0.1 MG tablet Take 0.2 mg by mouth at bedtime.    Yes [provider]  furosemide (LASIX) 40 MG tablet TAKE 1 TABLET BY MOUTH EVERY MORNING Patient taking differently: TAKE 40 MG BY MOUTH EVERY MORNING 09/20/12  Yes Dorena Cookey,  MD  ibuprofen (ADVIL,MOTRIN) 200 MG tablet Take 800 mg by mouth every 6 (six) hours as needed for headache or moderate pain.   Yes [provider]  levothyroxine (SYNTHROID, LEVOTHROID) 50 MCG tablet Take 50 mcg by mouth daily before breakfast.    Yes [provider]  losartan (COZAAR) 100 MG tablet Take 100 mg by mouth daily.   Yes [provider]  Multiple Vitamin (MULITIVITAMIN WITH MINERALS) TABS Take 2 tablets by mouth daily.    Yes [provider]  simvastatin (ZOCOR) 40 MG tablet Take 40 mg by mouth daily.  03/28/11  Yes  Dorena Cookey, MD  topiramate (TOPAMAX) 25 MG tablet Take 50 mg by mouth daily.    Yes [provider]  venlafaxine XR (EFFEXOR-XR) 150 MG 24 hr capsule TAKE ONE CAPSULE BY MOUTH TWICE A DAY Patient taking differently: TAKE 300 MG BY MOUTH IN THE EVENING 06/18/12  Yes Dorena Cookey, MD  HYDROcodone-acetaminophen (NORCO) 10-325 MG tablet Take 1 tablet by mouth every 6 (six) hours as needed. Patient not taking: Reported on 02/15/2017 12/12/16 12/12/17  Rolm Bookbinder, MD  HYDROcodone-acetaminophen (NORCO/VICODIN) 5-325 MG tablet Take 1-2 tablets by mouth every 4 (four) hours as needed for moderate pain. Patient not taking: Reported on 12/05/2016 10/25/16   Elmarie Shiley, MD     Family History  Problem Relation Age of Onset  . Colon polyps Sister   . Colon cancer Maternal Uncle   . Colon cancer Maternal Grandfather   . Heart disease Mother   . Hypertension Mother     Social History   Socioeconomic History  . Marital status: Married    Spouse name: Not on file  . Number of children: Not on file  . Years of education: Not on file  . Highest education level: Not on file  Social Needs  . Financial resource strain: Not on file  . Food insecurity - worry: Not on file  . Food insecurity - inability: Not on file  . Transportation needs - medical: Not on file  . Transportation needs - non-medical: Not on file  Occupational History  . Not on file  Tobacco Use  . Smoking status: Never Smoker  . Smokeless tobacco: Never Used  Substance and Sexual Activity  . Alcohol use: No  . Drug use: No  . Sexual activity: Not on file  Other Topics Concern  . Not on file  Social History Narrative  . Not on file    ECOG Status: 0 - Asymptomatic  Review of Systems: A 12 point ROS discussed and pertinent positives are indicated in the HPI above.  All other systems are negative.  Review of Systems  Constitutional: Negative for activity change, appetite change, fatigue and fever.   Genitourinary: Negative.  Negative for flank pain, hematuria and urgency.    Vital Signs: BP (!) 149/92   Pulse 66   Temp 99 F (37.2 C) (Oral)   Resp 14   Ht 5\' 4"  (1.626 m)   Wt 217 lb (98.4 kg)   SpO2 98%   BMI 37.25 kg/m   Physical Exam   Imaging:  Selected images from preprocedural MRI performed 08/28/2016, CTA of the abdomen and pelvis performed 10/17/2016, intraprocedural images from right-sided renal AML performed 10/24/2016 and procedural MRI performed 01/29/2017 were reviewed in detail with the patient.   Mr Abdomen Wwo Contrast  Result Date: 01/29/2017 CLINICAL DATA:  Embolization of Right Renal Angiomyolipoma, 10/24/2016, prior exam in pacs. Follow up. 47ml  of multihance given. ^24mL MULTIHANCE GADOBENATE DIMEGLUMINE 529 MG/ML IV SOLN EXAM: MRI ABDOMEN WITHOUT AND WITH CONTRAST TECHNIQUE: Multiplanar multisequence MR imaging of the abdomen was performed both before and after the administration of intravenous contrast. CONTRAST:  79mL MULTIHANCE GADOBENATE DIMEGLUMINE 529 MG/ML IV SOLN COMPARISON:  CT 9 4 18  FINDINGS: Lower chest:  Lung bases are clear. Hepatobiliary: Loss signal intensity within liver parenchymal opposed phase imaging consistent with hepatic steatosis. Postcholecystectomy. Common bile duct normal caliber. Pancreas: Normal pancreatic parenchymal intensity. No ductal dilatation or inflammation. Spleen: Normal spleen. Adrenals/urinary tract: Large angiomyolipoma extending from the RIGHT renal hilum again noted. Lesion measures 5.9 x 5.3 by 7.0 cm (volume = 110 cm^3) compared with 7.0 x 6.0 by 7.6 cm (volume = 170 cm^3). The nodular solid enhancing portion of the lesion along the inferior lateral border of the mass is decreased in size measuring approximately 1.6 x 1.0 cm in axial dimension (image 53, series 901) compared to 2.8 x 1.9 cm. There is no internal blood product in the lesion following therapy on the precontrast T1 weighted imaging. The renal veins are  patent. The renal parenchyma enhances uniformly on the LEFT and RIGHT with patent renal arteries. Stomach/Bowel: Stomach and limited of the small bowel is unremarkable Vascular/Lymphatic: Abdominal aortic normal caliber. No retroperitoneal periportal lymphadenopathy. Musculoskeletal: Several round lesions in the spine with fat signal (image 38, series 5 this consistent with benign hemangioma IMPRESSION: 1. Interval decreased in volume of the large angiomyolipoma extending from the RIGHT renal hilum following percutaneous embolization. 2. Interval decrease in size of enhancing solid component along the lateral margin of the angiomyolipoma. 3. Normal renal parenchymal enhancement in the LEFT and RIGHT kidney following embolization. 4. Hepatic steatosis Electronically Signed   By: Suzy Bouchard M.D.   On: 01/29/2017 09:19    Labs:  CBC: Recent Labs    03/20/16 2223 10/24/16 1252 12/07/16 1446  WBC 13.0* 9.0 8.6  HGB 13.6 13.5 12.8  HCT 41.2 40.1 39.5  PLT 312 321 299    COAGS: Recent Labs    10/24/16 1252  INR 0.91  APTT 30    BMP: Recent Labs    10/24/16 1252 10/25/16 0634 11/13/16 1244 12/07/16 1446 01/29/17 0813  NA 140 138 140 139  --   K 3.4* 3.8 4.1 3.4*  --   CL 104 105 104 105  --   CO2 25 26 30 25   --   GLUCOSE 95 113* 120 120*  --   BUN 14 20 16 8   --   CALCIUM 9.5 9.8 9.1 9.4  --   CREATININE 0.98 1.24* 1.15* 1.16* 1.20*  GFRNONAA 59* 44* 49* 48*  --   GFRAA >60 51* 57* 55*  --     LIVER FUNCTION TESTS: Recent Labs    03/20/16 2223 12/07/16 1446  BILITOT 0.4 0.5  AST 23 26  ALT 19 23  ALKPHOS 86 90  PROT 7.5 7.1  ALBUMIN 4.4 4.0    TUMOR MARKERS: No results for input(s): AFPTM, CEA, CA199, CHROMGRNA in the last 8760 hours.  Assessment and Plan:  Sierra Banks is a 67 y.o. female with past medical history significant for hypertension, hyperlipidemia, obesity, GERD and depression who underwent a technically successful right renal AML  embolization on 10/24/2016 and returns today for postprocedural evaluation and management following the acquisition of initial post procedural MRI performed 01/29/2017.   Selected images from preprocedural MRI performed 08/28/2016, CTA of the abdomen and pelvis performed 10/17/2016, intraprocedural  images from right-sided renal AML performed 10/24/2016 and procedural MRI performed 01/29/2017 were reviewed in detail with the patient.  Post procedural MRI performed 01/29/2017 demonstrates a technically excellent result with significant reduction in both the overall size of the partially exophytic and now more specifically, the angiogenic component within its medial aspect.  Overall, the AML has reduced in size from approximately 7.6 x 7.0 x 6.0 cm to now approximately 7.0 x 5.9 x 5.3 cm while the angiogenic component has reduced in size from approximately 2.8 x 1.9 cm to now approximately 1.6 x 1.1 cm.  I again to the patient that the AML is a benign tumor with only clinical symptoms concern being risk of rupture. As such, the patient was again educated on the clinical symptoms of AML rupture, specifically un-relenting flank pain and demonstrated excellent understanding of this conversation.  Plan:   - The patient will return to the interventional radiology clinic following the acquisition of surveillance abdominal MRI in 6 months (June 2019).  If AML is found to be stable to smaller in size, will likely switch to a yearly surveillance schedule until deemed no longer necessary.  The patient was encouraged to call the interventional radiology clinic with any interval questions or concerns.  Thank you for this interesting consult.  I greatly enjoyed meeting Sierra Banks and look forward to participating in their care.  A copy of this report was sent to the requesting provider on this date.  Electronically Signed: Sandi Mariscal 02/15/2017, 9:29 AM   I spent a total of 15 Minutes in face to face in  clinical consultation, greater than 50% of which was counseling/coordinating care for post embolization of right AML.

## 2017-02-17 DIAGNOSIS — J0100 Acute maxillary sinusitis, unspecified: Secondary | ICD-10-CM | POA: Diagnosis not present

## 2017-03-05 DIAGNOSIS — H66003 Acute suppurative otitis media without spontaneous rupture of ear drum, bilateral: Secondary | ICD-10-CM | POA: Diagnosis not present

## 2017-03-05 DIAGNOSIS — J209 Acute bronchitis, unspecified: Secondary | ICD-10-CM | POA: Diagnosis not present

## 2017-03-05 DIAGNOSIS — J329 Chronic sinusitis, unspecified: Secondary | ICD-10-CM | POA: Diagnosis not present

## 2017-04-25 DIAGNOSIS — R509 Fever, unspecified: Secondary | ICD-10-CM | POA: Diagnosis not present

## 2017-04-25 DIAGNOSIS — J101 Influenza due to other identified influenza virus with other respiratory manifestations: Secondary | ICD-10-CM | POA: Diagnosis not present

## 2017-04-25 DIAGNOSIS — R05 Cough: Secondary | ICD-10-CM | POA: Diagnosis not present

## 2017-07-10 ENCOUNTER — Other Ambulatory Visit (HOSPITAL_COMMUNITY): Payer: Self-pay | Admitting: Interventional Radiology

## 2017-07-10 DIAGNOSIS — D1771 Benign lipomatous neoplasm of kidney: Secondary | ICD-10-CM

## 2017-07-17 ENCOUNTER — Other Ambulatory Visit: Payer: Self-pay | Admitting: Radiology

## 2017-08-15 ENCOUNTER — Ambulatory Visit (HOSPITAL_COMMUNITY)
Admission: RE | Admit: 2017-08-15 | Discharge: 2017-08-15 | Disposition: A | Discharge status: Home / Self Care | Payer: Medicare Other | Source: Ambulatory Visit | Attending: Interventional Radiology | Admitting: Interventional Radiology

## 2017-08-15 ENCOUNTER — Ambulatory Visit
Admission: RE | Admit: 2017-08-15 | Discharge: 2017-08-15 | Disposition: A | Discharge status: Home / Self Care | Payer: Medicare Other | Source: Ambulatory Visit | Attending: Interventional Radiology | Admitting: Interventional Radiology

## 2017-08-15 ENCOUNTER — Encounter: Payer: Self-pay | Admitting: Radiology

## 2017-08-15 DIAGNOSIS — K760 Fatty (change of) liver, not elsewhere classified: Secondary | ICD-10-CM | POA: Diagnosis not present

## 2017-08-15 DIAGNOSIS — I700 Atherosclerosis of aorta: Secondary | ICD-10-CM | POA: Diagnosis not present

## 2017-08-15 DIAGNOSIS — D1771 Benign lipomatous neoplasm of kidney: Secondary | ICD-10-CM | POA: Insufficient documentation

## 2017-08-15 DIAGNOSIS — D3001 Benign neoplasm of right kidney: Secondary | ICD-10-CM | POA: Diagnosis not present

## 2017-08-15 DIAGNOSIS — Z9889 Other specified postprocedural states: Secondary | ICD-10-CM | POA: Diagnosis not present

## 2017-08-15 DIAGNOSIS — C9200 Acute myeloblastic leukemia, not having achieved remission: Secondary | ICD-10-CM | POA: Diagnosis not present

## 2017-08-15 HISTORY — PX: IR RADIOLOGIST EVAL & MGMT: IMG5224

## 2017-08-15 LAB — POCT I-STAT CREATININE: Creatinine, Ser: 1.2 mg/dL — ABNORMAL HIGH (ref 0.44–1.00)

## 2017-08-15 MED ORDER — GADOBENATE DIMEGLUMINE 529 MG/ML IV SOLN
20.0000 mL | Freq: Once | INTRAVENOUS | Status: AC | PRN
  Administered 2017-08-15: 20 mL via INTRAVENOUS

## 2017-08-15 NOTE — Progress Notes (Signed)
Patient ID: Sierra Banks, female   DOB: January 25, 1951, 67 y.o.   MRN: [id number]         Chief Complaint: Post right renal AML embolization  Referring Physician(s): Borden  History of Present Illness: Sierra Banks is a 67 y.o. female with past mental history significant for hypertension, hyperlipidemia, obesity, GERD and depression who underwent a technically successful right-sided renal AML embolization on 10/24/2016 and returns today for post procedural evaluation and management following the acquisition of surveillance abdominal MRI performed earlier today.  Patient is accompanied by her husband though serves as her own historian.  Patient is again without complaint.  Specifically, no flank pain, hematuria, dysuria, fever or chills.    Past Medical History:  Diagnosis Date  . ABSCESS, BREAST, RIGHT 10/04/2009  . DEPRESSION 08/28/2006  . Dysrhythmia    HEART PALPITATIONS   . GERD (gastroesophageal reflux disease)    OCC TUMS   . Hepatitis    JAUNDICE AGE 30  . 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 History of kidney stones 04/05/2016  . HYPERLIPIDEMIA 12/23/2007  . HYPERTENSION 08/28/2006  . Hypothyroidism    not as tired as she was  . Overweight(278.02) 01/12/2009    Past Surgical History:  Procedure Laterality Date  . CARPAL TUNNEL RELEASE     LEFT  2002  . CHOLECYSTECTOMY N/A 12/12/2016   Procedure: LAPAROSCOPIC CHOLECYSTECTOMY;  Surgeon: Rolm Bookbinder, MD;  Location: [facility];  Service: General;  Laterality: N/A;  . DILATION AND CURETTAGE OF UTERUS     2011  . fatty tumor removed from right arm  2016  . IR EMBO TUMOR ORGAN ISCHEMIA INFARCT INC GUIDE ROADMAPPING  10/24/2016  . IR RADIOLOGIST EVAL & MGMT  10/12/2016  . IR RADIOLOGIST EVAL & MGMT  11/16/2016  . IR RADIOLOGIST EVAL & MGMT  02/15/2017  . IR RADIOLOGIST EVAL & MGMT  08/15/2017  . IR RENAL SUPRASEL UNI S&I MOD SED  10/24/2016  . IR RENAL SUPRASEL UNI S&I MOD SED  10/24/2016  . IR US GUIDE VASC ACCESS RIGHT   10/24/2016  . JOINT REPLACEMENT Left   . MASS EXCISION Right 08/21/2014   Procedure: RIGHT FOREARM MASS EXCISION WITH REPAIR AND RECONSTRUCTION;  Surgeon: Roseanne Kaufman, MD;  Location: [facility];  Service: Orthopedics;  Laterality: Right;  . TONSILLECTOMY     1969  . TOTAL KNEE ARTHROPLASTY  05/08/2011   Procedure: TOTAL KNEE ARTHROPLASTY;  Surgeon: Rudean Haskell, MD;  Location: [facility];  Service: Orthopedics;  Laterality: Left;  . TUBAL LIGATION     1980    Allergies: Patient has no known allergies.  Medications: Prior to Admission medications   Medication Sig Start Date End Date Taking? Authorizing Provider  amLODipine (NORVASC) 10 MG tablet TAKE 1 TABLET BY MOUTH EVERY MORNING Patient taking differently: Take 5 mg by mouth every morning 04/11/12  Yes Dorena Cookey, MD  atenolol (TENORMIN) 50 MG tablet Take 100 mg by mouth daily.    Yes [provider]  buPROPion (WELLBUTRIN) 75 MG tablet TAKE 1 TABLET AT BEDTIME Patient taking differently: TAKE 75 MG BY MOUTH AT BEDTIME 09/20/12  Yes Dorena Cookey, MD  cloNIDine (CATAPRES) 0.1 MG tablet Take 0.2 mg by mouth at bedtime.    Yes [provider]  furosemide (LASIX) 40 MG tablet TAKE 1 TABLET BY MOUTH EVERY MORNING Patient taking differently: TAKE 40 MG BY MOUTH EVERY MORNING 09/20/12  Yes Dorena Cookey, MD  ibuprofen (ADVIL,MOTRIN) 200 MG  tablet Take 800 mg by mouth every 6 (six) hours as needed for headache or moderate pain.   Yes [provider]  levothyroxine (SYNTHROID, LEVOTHROID) 50 MCG tablet Take 50 mcg by mouth daily before breakfast.    Yes [provider]  losartan (COZAAR) 100 MG tablet Take 100 mg by mouth daily.   Yes [provider]  Multiple Vitamin (MULITIVITAMIN WITH MINERALS) TABS Take 2 tablets by mouth daily.    Yes [provider]  simvastatin (ZOCOR) 40 MG tablet Take 40 mg by mouth daily.  03/28/11  Yes Dorena Cookey, MD  topiramate (TOPAMAX) 25  MG tablet Take 50 mg by mouth daily.    Yes [provider]  venlafaxine XR (EFFEXOR-XR) 150 MG 24 hr capsule TAKE ONE CAPSULE BY MOUTH TWICE A DAY Patient taking differently: TAKE 300 MG BY MOUTH IN THE EVENING 06/18/12  Yes Dorena Cookey, MD  HYDROcodone-acetaminophen (NORCO) 10-325 MG tablet Take 1 tablet by mouth every 6 (six) hours as needed. Patient not taking: Reported on 02/15/2017 12/12/16 12/12/17  Rolm Bookbinder, MD  HYDROcodone-acetaminophen (NORCO/VICODIN) 5-325 MG tablet Take 1-2 tablets by mouth every 4 (four) hours as needed for moderate pain. Patient not taking: Reported on 12/05/2016 10/25/16   Elmarie Shiley, MD     Family History  Problem Relation Age of Onset  . Colon polyps Sister   . Colon cancer Maternal Uncle   . Colon cancer Maternal Grandfather   . Heart disease Mother   . Hypertension Mother     Social History   Socioeconomic History  . Marital status: Married    Spouse name: Not on file  . Number of children: Not on file  . Years of education: Not on file  . Highest education level: Not on file  Occupational History  . Not on file  Social Needs  . Financial resource strain: Not on file  . Food insecurity:    Worry: Not on file    Inability: Not on file  . Transportation needs:    Medical: Not on file    Non-medical: Not on file  Tobacco Use  . Smoking status: Never Smoker  . Smokeless tobacco: Never Used  Substance and Sexual Activity  . Alcohol use: No  . Drug use: No  . Sexual activity: Not on file  Lifestyle  . Physical activity:    Days per week: Not on file    Minutes per session: Not on file  . Stress: Not on file  Relationships  . Social connections:    Talks on phone: Not on file    Gets together: Not on file    Attends religious service: Not on file    Active member of club or organization: Not on file    Attends meetings of clubs or organizations: Not on file    Relationship status: Not on file  Other Topics  Concern  . Not on file  Social History Narrative  . Not on file    ECOG Status: 0 - Asymptomatic  Review of Systems: A 12 point ROS discussed and pertinent positives are indicated in the HPI above.  All other systems are negative.  Review of Systems  Vital Signs: BP 119/60   Pulse 62   Temp 97.8 F (36.6 C) (Oral)   Resp 14   Ht 5\' 4"  (1.626 m)   Wt 217 lb (98.4 kg)   SpO2 95%   BMI 37.25 kg/m   Physical Exam  Imaging:  Selected images from preprocedural abdominal MRI performed 08/28/2016 and surveillance post residual abdominal MRIs performed 01/29/2017 and earlier today were reviewed in detail with the patient and the patient's husband.  Mr Abdomen Wwo Contrast  Result Date: 08/15/2017 CLINICAL DATA:  Status post particle and coil embolization of angiogenic component of right renal AML 10/24/2016. follow-up. EXAM: MRI ABDOMEN WITHOUT AND WITH CONTRAST TECHNIQUE: Multiplanar multisequence MR imaging of the abdomen was performed both before and after the administration of intravenous contrast. CONTRAST:  59mL MULTIHANCE GADOBENATE DIMEGLUMINE 529 MG/ML IV SOLN COMPARISON:  01/29/2017 MRI abdomen. FINDINGS: Lower chest: No acute abnormality at the lung bases. Hepatobiliary: Normal liver size and configuration. Moderate to severe diffuse hepatic steatosis. Stable tiny 0.3 cm anterior right liver lobe T2 hyperintense lesion (series 4/image 23), considered benign, probably a cyst. No additional liver lesions. Cholecystectomy. Bile ducts are stable and within normal post cholecystectomy limits. Common bile duct diameter 5 mm. No choledocholithiasis. Pancreas: No pancreatic mass or duct dilation.  No pancreas divisum. Spleen: Normal size. No mass. Adrenals/Urinary Tract: Normal adrenals. No hydronephrosis. 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 Angiogenic component of the right renal AML measures 1.5 x 0.8 cm at the lateral margin (series 905/image 65), previously 1.8 x 0.9 cm using similar measurement technique, slightly decreased. No new solid angiogenic components in the right renal AML. No active hemorrhage or right retroperitoneal hematoma associated with the right renal AML. Scattered subcentimeter simple left renal cysts. Stomach/Bowel: Normal non-distended stomach. Visualized small and large bowel is normal caliber, with no bowel wall thickening. Vascular/Lymphatic: Atherosclerotic nonaneurysmal abdominal aorta. Patent portal, splenic, hepatic and renal veins. No pathologically enlarged lymph nodes in the abdomen. Other: No abdominal ascites or focal fluid collection. Musculoskeletal: No aggressive appearing focal osseous lesions. IMPRESSION: 1. Overall stable size of right renal sinus AML. Angiogenic component of right renal AML has slightly decreased. No interval complication. 2. Diffuse hepatic steatosis. 3.  Aortic Atherosclerosis (ICD10-I70.0). Electronically Signed   By: Ilona Sorrel M.D.   On: 08/15/2017 14:54   Ir Radiologist Eval & Mgmt  Result Date: 08/15/2017 Please refer to notes tab for details about interventional procedure. (Op Note)   Labs:  CBC: Recent Labs    10/24/16 1252 12/07/16 1446  WBC 9.0 8.6  HGB 13.5 12.8  HCT 40.1 39.5  PLT 321 299    COAGS: Recent Labs    10/24/16 1252  INR 0.91  APTT 30    BMP: Recent Labs    10/24/16 1252 10/25/16 0634 11/13/16 1244 12/07/16 1446 01/29/17 0813  NA 140 138 140 139  --   K 3.4* 3.8 4.1 3.4*  --   CL 104 105 104 105  --   CO2 25 26 30 25   --   GLUCOSE 95 113* 120 120*  --   BUN 14 20 16 8   --   CALCIUM 9.5 9.8 9.1 9.4  --   CREATININE 0.98 1.24* 1.15* 1.16* 1.20*  GFRNONAA 59* 44* 49* 48*  --   GFRAA >60 51* 57* 55*  --     LIVER FUNCTION TESTS: Recent Labs     12/07/16 1446  BILITOT 0.5  AST 26  ALT 23  ALKPHOS 90  PROT 7.1  ALBUMIN 4.0    TUMOR MARKERS: No results for input(s): AFPTM, CEA,  CA199, CHROMGRNA in the last 8760 hours.  Assessment and Plan:  Sierra Banks is a 68 y.o. female with past mental history significant for hypertension, hyperlipidemia, obesity, GERD and depression who underwent a technically successful right-sided renal AML embolization on 10/24/2016 and returns today for post procedural evaluation and management following the acquisition of surveillance abdominal MRI performed earlier today.  Selected images from preprocedural abdominal MRI performed 08/28/2016 and surveillance post residual abdominal MRIs performed 01/29/2017 and earlier today were reviewed in detail with the patient and the patient's husband.  My direct measurements of the right adrenal angiomyolipoma and angiogenic component are as follows:  Abdominal MRI - 08/28/2016 - 7.8 x 6.8 x 6.0 cm (angiogenic component - 2.5 x 1.5 cm) Abdominal MRI - 01/29/2017 - 7.0 x 5.9 x 5.3 cm (angiogenic component - 7.1 x 1.1 cm) Abdominal MRI - earlier today - 6.8 x 5.6 x 4.5 cm (angiogenic component-1.5 x 0.8 cm)  Additionally, today's MRI is negative for evidence of new solid angiogenic components, active hemorrhage or retroperitoneal hematoma.  Above findings suggest a technically excellent result.  As such I will obtain a surveillance abdominal MRI in 1 year (July 2020).    If lesion either remains stable or continues to involute, continued follow-up will likely be deferred at that time.  The patient and the patient's husband are in agreement with the above proposed plan of care and know to call the interventional radiology clinic with any interval questions or concerns.  A copy of this report was sent to the requesting provider on this date.  Electronically Signed: Sandi Mariscal 08/15/2017, 4:13 PM   I spent a total of 15 Minutes in face to face in clinical  consultation, greater than 50% of which was counseling/coordinating care for post right sided renal AML embolization.

## 2017-11-21 DIAGNOSIS — M13841 Other specified arthritis, right hand: Secondary | ICD-10-CM | POA: Diagnosis not present

## 2017-11-21 DIAGNOSIS — M79641 Pain in right hand: Secondary | ICD-10-CM | POA: Diagnosis not present

## 2017-12-31 DIAGNOSIS — H01001 Unspecified blepharitis right upper eyelid: Secondary | ICD-10-CM | POA: Diagnosis not present

## 2017-12-31 DIAGNOSIS — H2513 Age-related nuclear cataract, bilateral: Secondary | ICD-10-CM | POA: Diagnosis not present

## 2017-12-31 DIAGNOSIS — H353121 Nonexudative age-related macular degeneration, left eye, early dry stage: Secondary | ICD-10-CM | POA: Diagnosis not present

## 2017-12-31 DIAGNOSIS — H01002 Unspecified blepharitis right lower eyelid: Secondary | ICD-10-CM | POA: Diagnosis not present

## 2018-01-01 DIAGNOSIS — M13841 Other specified arthritis, right hand: Secondary | ICD-10-CM | POA: Diagnosis not present

## 2018-01-01 DIAGNOSIS — M13141 Monoarthritis, not elsewhere classified, right hand: Secondary | ICD-10-CM | POA: Diagnosis not present

## 2018-01-14 DIAGNOSIS — K529 Noninfective gastroenteritis and colitis, unspecified: Secondary | ICD-10-CM | POA: Diagnosis not present

## 2018-01-14 DIAGNOSIS — F5109 Other insomnia not due to a substance or known physiological condition: Secondary | ICD-10-CM | POA: Diagnosis not present

## 2018-01-16 DIAGNOSIS — Z4789 Encounter for other orthopedic aftercare: Secondary | ICD-10-CM | POA: Diagnosis not present

## 2018-01-16 DIAGNOSIS — M79641 Pain in right hand: Secondary | ICD-10-CM | POA: Diagnosis not present

## 2018-01-16 DIAGNOSIS — M25641 Stiffness of right hand, not elsewhere classified: Secondary | ICD-10-CM | POA: Diagnosis not present

## 2018-01-30 DIAGNOSIS — Z4789 Encounter for other orthopedic aftercare: Secondary | ICD-10-CM | POA: Diagnosis not present

## 2018-01-30 DIAGNOSIS — M25641 Stiffness of right hand, not elsewhere classified: Secondary | ICD-10-CM | POA: Diagnosis not present

## 2018-02-25 DIAGNOSIS — I10 Essential (primary) hypertension: Secondary | ICD-10-CM | POA: Diagnosis not present

## 2018-02-25 DIAGNOSIS — E039 Hypothyroidism, unspecified: Secondary | ICD-10-CM | POA: Diagnosis not present

## 2018-02-25 DIAGNOSIS — K529 Noninfective gastroenteritis and colitis, unspecified: Secondary | ICD-10-CM | POA: Diagnosis not present

## 2018-02-25 DIAGNOSIS — F322 Major depressive disorder, single episode, severe without psychotic features: Secondary | ICD-10-CM | POA: Diagnosis not present

## 2018-02-25 DIAGNOSIS — Z1389 Encounter for screening for other disorder: Secondary | ICD-10-CM | POA: Diagnosis not present

## 2018-02-25 DIAGNOSIS — Z79899 Other long term (current) drug therapy: Secondary | ICD-10-CM | POA: Diagnosis not present

## 2018-02-25 DIAGNOSIS — D1721 Benign lipomatous neoplasm of skin and subcutaneous tissue of right arm: Secondary | ICD-10-CM | POA: Diagnosis not present

## 2018-02-25 DIAGNOSIS — Z0000 Encounter for general adult medical examination without abnormal findings: Secondary | ICD-10-CM | POA: Diagnosis not present

## 2018-02-25 DIAGNOSIS — N2889 Other specified disorders of kidney and ureter: Secondary | ICD-10-CM | POA: Diagnosis not present

## 2018-02-25 DIAGNOSIS — E7800 Pure hypercholesterolemia, unspecified: Secondary | ICD-10-CM | POA: Diagnosis not present

## 2018-02-25 DIAGNOSIS — R102 Pelvic and perineal pain: Secondary | ICD-10-CM | POA: Diagnosis not present

## 2018-03-18 DIAGNOSIS — Z1231 Encounter for screening mammogram for malignant neoplasm of breast: Secondary | ICD-10-CM | POA: Diagnosis not present

## 2018-03-18 DIAGNOSIS — Z803 Family history of malignant neoplasm of breast: Secondary | ICD-10-CM | POA: Diagnosis not present

## 2018-03-26 DIAGNOSIS — E039 Hypothyroidism, unspecified: Secondary | ICD-10-CM | POA: Diagnosis not present

## 2018-03-26 DIAGNOSIS — H6123 Impacted cerumen, bilateral: Secondary | ICD-10-CM | POA: Diagnosis not present

## 2018-03-26 DIAGNOSIS — I10 Essential (primary) hypertension: Secondary | ICD-10-CM | POA: Diagnosis not present

## 2018-03-26 DIAGNOSIS — K529 Noninfective gastroenteritis and colitis, unspecified: Secondary | ICD-10-CM | POA: Diagnosis not present

## 2018-03-26 DIAGNOSIS — F322 Major depressive disorder, single episode, severe without psychotic features: Secondary | ICD-10-CM | POA: Diagnosis not present

## 2018-04-04 DIAGNOSIS — M7062 Trochanteric bursitis, left hip: Secondary | ICD-10-CM | POA: Diagnosis not present

## 2018-04-04 DIAGNOSIS — M1611 Unilateral primary osteoarthritis, right hip: Secondary | ICD-10-CM | POA: Diagnosis not present

## 2018-04-04 DIAGNOSIS — M25552 Pain in left hip: Secondary | ICD-10-CM | POA: Diagnosis not present

## 2018-04-11 DIAGNOSIS — R300 Dysuria: Secondary | ICD-10-CM | POA: Diagnosis not present

## 2018-04-18 ENCOUNTER — Ambulatory Visit: Payer: Medicare Other | Admitting: Gastroenterology

## 2018-04-29 DIAGNOSIS — Z124 Encounter for screening for malignant neoplasm of cervix: Secondary | ICD-10-CM | POA: Diagnosis not present

## 2018-04-29 DIAGNOSIS — Z6837 Body mass index (BMI) 37.0-37.9, adult: Secondary | ICD-10-CM | POA: Diagnosis not present

## 2018-04-29 DIAGNOSIS — R3915 Urgency of urination: Secondary | ICD-10-CM | POA: Diagnosis not present

## 2018-05-09 ENCOUNTER — Other Ambulatory Visit (INDEPENDENT_AMBULATORY_CARE_PROVIDER_SITE_OTHER): Payer: Medicare Other

## 2018-05-09 ENCOUNTER — Other Ambulatory Visit: Payer: Self-pay

## 2018-05-09 ENCOUNTER — Encounter: Payer: Self-pay | Admitting: Gastroenterology

## 2018-05-09 ENCOUNTER — Telehealth: Payer: Medicare Other | Admitting: Gastroenterology

## 2018-05-09 DIAGNOSIS — R194 Change in bowel habit: Secondary | ICD-10-CM | POA: Diagnosis not present

## 2018-05-09 DIAGNOSIS — R1010 Upper abdominal pain, unspecified: Secondary | ICD-10-CM

## 2018-05-09 DIAGNOSIS — R152 Fecal urgency: Secondary | ICD-10-CM

## 2018-05-09 LAB — CBC WITH DIFFERENTIAL/PLATELET
Basophils Absolute: 0.1 10*3/uL (ref 0.0–0.1)
Basophils Relative: 0.6 % (ref 0.0–3.0)
Eosinophils Absolute: 0.2 10*3/uL (ref 0.0–0.7)
Eosinophils Relative: 2.5 % (ref 0.0–5.0)
HCT: 41.7 % (ref 36.0–46.0)
Hemoglobin: 14.3 g/dL (ref 12.0–15.0)
Lymphocytes Relative: 32.2 % (ref 12.0–46.0)
Lymphs Abs: 2.9 10*3/uL (ref 0.7–4.0)
MCHC: 34.4 g/dL (ref 30.0–36.0)
MCV: 84.4 fl (ref 78.0–100.0)
Monocytes Absolute: 0.7 10*3/uL (ref 0.1–1.0)
Monocytes Relative: 7.6 % (ref 3.0–12.0)
Neutro Abs: 5.2 10*3/uL (ref 1.4–7.7)
Neutrophils Relative %: 57.1 % (ref 43.0–77.0)
Platelets: 328 10*3/uL (ref 150.0–400.0)
RBC: 4.94 Mil/uL (ref 3.87–5.11)
RDW: 14.6 % (ref 11.5–15.5)
WBC: 9.1 10*3/uL (ref 4.0–10.5)

## 2018-05-09 LAB — COMPREHENSIVE METABOLIC PANEL
ALT: 33 U/L (ref 0–35)
AST: 22 U/L (ref 0–37)
Albumin: 4.2 g/dL (ref 3.5–5.2)
Alkaline Phosphatase: 93 U/L (ref 39–117)
BUN: 12 mg/dL (ref 6–23)
CO2: 31 mEq/L (ref 19–32)
Calcium: 9.5 mg/dL (ref 8.4–10.5)
Chloride: 102 mEq/L (ref 96–112)
Creatinine, Ser: 1.19 mg/dL (ref 0.40–1.20)
GFR: 45.04 mL/min — ABNORMAL LOW (ref >60.00)
Glucose, Bld: 127 mg/dL — ABNORMAL HIGH (ref 70–99)
Potassium: 3.1 mEq/L — ABNORMAL LOW (ref 3.5–5.1)
Sodium: 142 mEq/L (ref 135–145)
Total Bilirubin: 0.5 mg/dL (ref 0.2–1.2)
Total Protein: 7.1 g/dL (ref 6.0–8.3)

## 2018-05-09 LAB — LIPASE: Lipase: 12 U/L (ref 11.0–59.0)

## 2018-05-09 MED ORDER — DICYCLOMINE HCL 10 MG PO CAPS: 10.0000 mg | ORAL_CAPSULE | Freq: Three times a day (TID) | ORAL | 11 refills | Status: DC

## 2018-05-09 NOTE — Progress Notes (Signed)
History of Present Illness: This is a 68 year old referred by Josetta Huddle, MD for the evaluation of severe episodic upper abdominal pain, change in bowel habits, fecal urgency.  She relates a 81-month history of urgent loose bowel movements occurring almost on a daily basis.  Often symptoms followed meals but not always.  These symptoms have recently resolved and her bowel pattern has returned to her prior pattern of one bowel movement per day without urgency.  Over the past few weeks she has developed severe episodic upper abdominal pain which is not necessarily related to meals but sometimes relieved with a bowel movement.  She describes it lasting for about 1 hour and located above her umbilicus, and generalized across her upper abdomen.  She denies any medication or diet changes.  She was evaluated by her PCP Dr. Inda Merlin about 1 month ago and blood work was obtained at was unremarkable and he recommended a return visit to me.  Patient previously Slee underwent colonoscopy in August 2016 that showed mild sigmoid colon diverticulosis.  She underwent MR abdomen with and without contrast in July 2019 showing no gastrointestinal abnormalities except for hepatic steatosis. Denies weight loss, constipation, change in stool caliber, melena, hematochezia, nausea, vomiting, dysphagia, reflux symptoms, chest pain.   No Known Allergies Outpatient Medications Prior to Visit  Medication Sig Dispense Refill  . amLODipine (NORVASC) 10 MG tablet TAKE 1 TABLET BY MOUTH EVERY MORNING (Patient taking differently: Take 5 mg by mouth every morning) 100 tablet 0  . atenolol (TENORMIN) 50 MG tablet Take 100 mg by mouth daily.     Marland Kitchen buPROPion (WELLBUTRIN) 75 MG tablet TAKE 1 TABLET AT BEDTIME (Patient taking differently: TAKE 75 MG BY MOUTH AT BEDTIME) 90 tablet 1  . cloNIDine (CATAPRES) 0.1 MG tablet Take 0.2 mg by mouth at bedtime.     . furosemide (LASIX) 40 MG tablet TAKE 1 TABLET BY MOUTH EVERY MORNING (Patient  taking differently: TAKE 40 MG BY MOUTH EVERY MORNING) 100 tablet 0  . levothyroxine (SYNTHROID, LEVOTHROID) 50 MCG tablet Take 50 mcg by mouth daily before breakfast.     . losartan (COZAAR) 100 MG tablet Take 100 mg by mouth daily.    . simvastatin (ZOCOR) 40 MG tablet Take 40 mg by mouth daily.     Marland Kitchen topiramate (TOPAMAX) 25 MG tablet Take 50 mg by mouth daily.     Marland Kitchen venlafaxine XR (EFFEXOR-XR) 150 MG 24 hr capsule TAKE ONE CAPSULE BY MOUTH TWICE A DAY (Patient taking differently: TAKE 300 MG BY MOUTH IN THE EVENING) 180 capsule 1  . HYDROcodone-acetaminophen (NORCO/VICODIN) 5-325 MG tablet Take 1-2 tablets by mouth every 4 (four) hours as needed for moderate pain. (Patient not taking: Reported on 12/05/2016) 10 tablet 0  . ibuprofen (ADVIL,MOTRIN) 200 MG tablet Take 800 mg by mouth every 6 (six) hours as needed for headache or moderate pain.    . Multiple Vitamin (MULITIVITAMIN WITH MINERALS) TABS Take 2 tablets by mouth daily.      No facility-administered medications prior to visit.    Past Medical History:  Diagnosis Date  . ABSCESS, BREAST, RIGHT 10/04/2009  . Allergic rhinitis   . DEPRESSION 08/28/2006  . Dysrhythmia    HEART PALPITATIONS   . GERD (gastroesophageal reflux disease)    OCC TUMS   . Hepatitis    JAUNDICE AGE 46  . 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 History of kidney stones 04/05/2016  . HYPERLIPIDEMIA 12/23/2007  . HYPERTENSION 08/28/2006  . Hypothyroidism  not as tired as she was  . Overweight(278.02) 01/12/2009   Past Surgical History:  Procedure Laterality Date  . CARPAL TUNNEL RELEASE     LEFT  2002  . CHOLECYSTECTOMY N/A 12/12/2016   Procedure: LAPAROSCOPIC CHOLECYSTECTOMY;  Surgeon: Rolm Bookbinder, MD;  Location: [facility];  Service: General;  Laterality: N/A;  . DILATION AND CURETTAGE OF UTERUS     2011  . fatty tumor removed from right arm  2016  . IR EMBO TUMOR ORGAN ISCHEMIA INFARCT INC GUIDE ROADMAPPING  10/24/2016  . IR RADIOLOGIST EVAL & MGMT   10/12/2016  . IR RADIOLOGIST EVAL & MGMT  11/16/2016  . IR RADIOLOGIST EVAL & MGMT  02/15/2017  . IR RADIOLOGIST EVAL & MGMT  08/15/2017  . IR RENAL SUPRASEL UNI S&I MOD SED  10/24/2016  . IR RENAL SUPRASEL UNI S&I MOD SED  10/24/2016  . IR US GUIDE VASC ACCESS RIGHT  10/24/2016  . JOINT REPLACEMENT Left   . MASS EXCISION Right 08/21/2014   Procedure: RIGHT FOREARM MASS EXCISION WITH REPAIR AND RECONSTRUCTION;  Surgeon: Roseanne Kaufman, MD;  Location: [facility];  Service: Orthopedics;  Laterality: Right;  . TONSILLECTOMY     1969  . TOTAL KNEE ARTHROPLASTY  05/08/2011   Procedure: TOTAL KNEE ARTHROPLASTY;  Surgeon: Rudean Haskell, MD;  Location: [facility];  Service: Orthopedics;  Laterality: Left;  . TUBAL LIGATION     1980   Social History   Socioeconomic History  . Marital status: Married    Spouse name: Not on file  . Number of children: Not on file  . Years of education: Not on file  . Highest education level: Not on file  Occupational History  . Not on file  Social Needs  . Financial resource strain: Not on file  . Food insecurity:    Worry: Not on file    Inability: Not on file  . Transportation needs:    Medical: Not on file    Non-medical: Not on file  Tobacco Use  . Smoking status: Never Smoker  . Smokeless tobacco: Never Used  Substance and Sexual Activity  . Alcohol use: No  . Drug use: No  . Sexual activity: Not on file  Lifestyle  . Physical activity:    Days per week: Not on file    Minutes per session: Not on file  . Stress: Not on file  Relationships  . Social connections:    Talks on phone: Not on file    Gets together: Not on file    Attends religious service: Not on file    Active member of club or organization: Not on file    Attends meetings of clubs or organizations: Not on file    Relationship status: Not on file  Other Topics Concern  . Not on file  Social History Narrative  . Not on file   Family History  Problem Relation Age of  Onset  . Colon polyps Sister   . Colon cancer Maternal Uncle   . Colon cancer Maternal Grandfather   . Heart disease Mother   . Hypertension Mother       Review of Systems: Pertinent positive and negative review of systems were noted in the above HPI section. All other review of systems were otherwise negative.   Physical Exam: Not done - Telemedicine visit   Assessment and Recommendations:  1.  New onset of severe episodic upper abdominal pain.  Change in bowel habits with loose stools  and fecal urgency for 6 months has recently resolved.  Prior cholecystectomy with CBD 5 mm on MR several months ago.  Request blood work from Dr. Geraldo Docker office.  CBC, CMP, lipase is urgent. Schedule urgent abdominal/pelvic CT. Trial of dicyclomine 10 mg 3 times daily AC. REV in 1 month.   2.  Hepatic steatosis.  Long-term weight loss program with a fat and carb modified diet supervised by her PCP.  3.  Angiomyolipoma, right kidney  4.  Family history of colon cancer, maternal uncle and maternal grandfather.  A 5-year interval screening colonoscopy is recommended in August 2021.   This service was provided via telemedicine.  The patient was located at home by herself.  The provider was located in my office by myself.  The patient did consent to this telemedicine visit and is aware of possible charges for this visit.  The patient was referred by Josetta Huddle, MD  There were no other persons participating in this telemedicine service.  Time spent with patient: 22 minutes Time spent with patient, reviewing chart and coordinating care  45 minute.    cc: Josetta Huddle, MD [facility] [employer] [address] [place], [place] [address]

## 2018-05-09 NOTE — Patient Instructions (Signed)
Please go to our basement level lab for blood work today.   We have sent the following medications to your pharmacy for you to pick up at your convenience: dicyclomine.   You have been scheduled for a CT scan of the abdomen and pelvis at [facility] ([address]---this is in the same building as [facility]).   You are scheduled on 05/10/18 at 1:00pm. You should arrive 15 minutes prior to your appointment time for registration. Please follow the written instructions below on the day of your exam:  WARNING: IF YOU ARE ALLERGIC TO IODINE/X-RAY DYE, PLEASE NOTIFY RADIOLOGY IMMEDIATELY AT [phone]! YOU WILL BE GIVEN A 13 HOUR PREMEDICATION PREP.  1) Do not eat or drink anything after 9:00am (4 hours prior to your test) 2) You have been given 2 bottles of oral contrast to drink. The solution may taste better if refrigerated, but do NOT add ice or any other liquid to this solution. Shake well before drinking.    Drink 1 bottle of contrast @ 11:00am (2 hours prior to your exam)  Drink 1 bottle of contrast @ 12:00pm (1 hour prior to your exam)  You may take any medications as prescribed with a small amount of water, if necessary. If you take any of the following medications: METFORMIN, GLUCOPHAGE, GLUCOVANCE, AVANDAMET, RIOMET, FORTAMET, Naomi MET, JANUMET, GLUMETZA or METAGLIP, you MAY be asked to HOLD this medication 48 hours AFTER the exam.  The purpose of you drinking the oral contrast is to aid in the visualization of your intestinal tract. The contrast solution may cause some diarrhea. Depending on your individual set of symptoms, you may also receive an intravenous injection of x-ray contrast/dye. Plan on being at [facility] for 30 minutes or longer, depending on the type of exam you are having performed.  This test typically takes 30-45 minutes to complete.  If you have any questions regarding your exam or if you need to reschedule, you may call the CT  department at [phone] between the hours of 8:00 am and 5:00 pm, Monday-Friday.  ________________________________________________________________________

## 2018-05-10 ENCOUNTER — Ambulatory Visit (INDEPENDENT_AMBULATORY_CARE_PROVIDER_SITE_OTHER)
Admission: RE | Admit: 2018-05-10 | Discharge: 2018-05-10 | Disposition: A | Discharge status: Home / Self Care | Payer: Medicare Other | Source: Ambulatory Visit | Attending: Gastroenterology | Admitting: Gastroenterology

## 2018-05-10 DIAGNOSIS — R1010 Upper abdominal pain, unspecified: Secondary | ICD-10-CM | POA: Diagnosis not present

## 2018-05-10 DIAGNOSIS — K760 Fatty (change of) liver, not elsewhere classified: Secondary | ICD-10-CM | POA: Diagnosis not present

## 2018-05-10 DIAGNOSIS — R194 Change in bowel habit: Secondary | ICD-10-CM

## 2018-05-10 DIAGNOSIS — R152 Fecal urgency: Secondary | ICD-10-CM

## 2018-05-10 MED ORDER — IOHEXOL 300 MG/ML  SOLN
100.0000 mL | Freq: Once | INTRAMUSCULAR | Status: AC | PRN
  Administered 2018-05-10: 100 mL via INTRAVENOUS

## 2018-05-29 ENCOUNTER — Other Ambulatory Visit: Payer: Self-pay

## 2018-05-29 ENCOUNTER — Ambulatory Visit: Payer: Medicare Other | Admitting: Gastroenterology

## 2018-05-29 ENCOUNTER — Ambulatory Visit (INDEPENDENT_AMBULATORY_CARE_PROVIDER_SITE_OTHER): Payer: Medicare Other | Admitting: Gastroenterology

## 2018-05-29 ENCOUNTER — Encounter: Payer: Self-pay | Admitting: Gastroenterology

## 2018-05-29 VITALS — Ht 64.0 in | Wt 217.0 lb

## 2018-05-29 DIAGNOSIS — R194 Change in bowel habit: Secondary | ICD-10-CM

## 2018-05-29 DIAGNOSIS — R1013 Epigastric pain: Secondary | ICD-10-CM | POA: Diagnosis not present

## 2018-05-29 MED ORDER — DICYCLOMINE HCL 20 MG PO TABS: 20.0000 mg | ORAL_TABLET | Freq: Three times a day (TID) | ORAL | 11 refills | Status: DC

## 2018-05-29 NOTE — Progress Notes (Addendum)
    History of Present Illness: This is a 68 year old female returning for follow-up of episodic upper abdominal pain, change in bowel habits and fecal urgency.  Abdominal/pelvic CT performed on March 28 was did not show any findings to explain upper abdominal pain or her other GI symptoms.  CMP was remarkable for potassium of 3.1 and glucose of 127 otherwise normal. CBC and lipase were normal.  Dicyclomine was started and her symptoms have improved but have not resolved.   Current Medications, Allergies, Past Medical History, Past Surgical History, Family History and Social History were reviewed in [employer] record.   Physical Exam: Telemedicine visit - not performed   Assessment and Recommendations:  1.  Episodic epigastric pain with change in bowel habits and fecal urgency.  Increase dicyclomine to 20 mg 3 times daily AC.  She will take two 10 mg 3 times daily and then begin a new prescription for 20 mg 3 times daily. Avoid NSAIDs. REV in 1 month.   2.  Hepatic steatosis.  Long-term weight loss program with a fat and carb modified diet supervised by her PCP.  3.  Family history of colon cancer, maternal uncle and maternal grandfather.  A 5-year interval screening colonoscopy is recommended in August 2021.   These services were provided via telemedicine, audio only.  The patient was at home and the provider was in the office, alone.  We discussed the limitations of evaluation and management by telemedicine and the availability of in person appointments.  Patient consented for this telemedicine visit and is aware of possible charges for this service.  The other person participating in the telemedicine service was Marlon Pel, [place] who reviewed medications, allergies, past history and completed AVS.  Time spent on call: 8 minutes

## 2018-05-29 NOTE — Patient Instructions (Addendum)
Increase your dicyclomine to 20 mg one capsule by mouth three times a day before meals. You can take two 10 mg capsules until you pick up your new prescription for the 20 mg dose.   Avoid all NSAID's such as ibuprofen, Aleve and aspirin.   Your follow up appointment with Dr. Fuller Plan is on 06/28/18 at 9:30am. This will be telephone visit like the one you had with Dr. Fuller Plan today but this also is subject to change depending on the ongoing covid-19 restrictions.   Thank you for choosing me and [facility].  Pricilla Riffle. Dagoberto Ligas., MD., [facility]

## 2018-06-25 ENCOUNTER — Other Ambulatory Visit: Payer: Self-pay

## 2018-06-25 ENCOUNTER — Ambulatory Visit (INDEPENDENT_AMBULATORY_CARE_PROVIDER_SITE_OTHER): Payer: Medicare Other | Admitting: Gastroenterology

## 2018-06-25 ENCOUNTER — Encounter: Payer: Self-pay | Admitting: Gastroenterology

## 2018-06-25 VITALS — Ht 64.0 in | Wt 217.0 lb

## 2018-06-25 DIAGNOSIS — Z800 Family history of malignant neoplasm of digestive organs: Secondary | ICD-10-CM | POA: Diagnosis not present

## 2018-06-25 DIAGNOSIS — K760 Fatty (change of) liver, not elsewhere classified: Secondary | ICD-10-CM

## 2018-06-25 DIAGNOSIS — R1013 Epigastric pain: Secondary | ICD-10-CM

## 2018-06-25 NOTE — Progress Notes (Signed)
    History of Present Illness: This is a 68 year old female with episodic epigastric pain associated with a change in bowel habits and fecal urgency that is now under good control with dicyclomine.  After increasing dicyclomine to 20 mg 3 times daily her symptoms have come under good control and she is now taking dicyclomine twice daily.  She has no other gastrointestinal complaints and she is very satisfied with her improvement.  Current Medications, Allergies, Past Medical History, Past Surgical History, Family History and Social History were reviewed in [employer] record.   Physical Exam: Telemedicine - not performed   Assessment and Recommendations:  1.  Episodic epigastric pain associated with a change in bowel habits and fecal urgency.  Symptoms controlled with dicyclomine 20 mg twice daily.  Advised she may use dicyclomine 3 times daily, 2 times daily, once daily or discontinue if her symptoms completely resolve.  If symptoms are not well controlled with dicyclomine she is advised to call for further management plans.  2.  Hepatic steatosis.  Continue long-term weight loss program with a fat modified and carb modified diet supervised by her PCP.  3. Family history of colon cancer in maternal uncle and maternal grandfather.  5-year interval screening colonoscopy is recommended in August 2021.   These services were provided via telemedicine, audio only per patient request.  The patient was at home and the provider was in the office, alone.  We discussed the limitations of evaluation and management by telemedicine and the availability of in person appointments.  Patient consented for this telemedicine visit and is aware of possible charges for this service.  The other person participating in the telemedicine service was Marlon Pel, [place] who reviewed medications, allergies, past history and completed AVS.  Time spent on call: 5 minutes

## 2018-06-25 NOTE — Patient Instructions (Signed)
You can continue dicyclomine 1-3 x daily or even discontinue if your symptoms resolve.   Call our office if your symptoms are not under control.   Thank you for choosing me and [facility].  Pricilla Riffle. Dagoberto Ligas., MD., Marval Regal

## 2018-06-28 ENCOUNTER — Ambulatory Visit: Payer: Medicare Other | Admitting: Gastroenterology

## 2018-07-24 ENCOUNTER — Telehealth: Payer: Self-pay | Admitting: Gastroenterology

## 2018-07-24 ENCOUNTER — Other Ambulatory Visit: Payer: Self-pay

## 2018-07-24 MED ORDER — GLYCOPYRROLATE 2 MG PO TABS: 2.0000 mg | ORAL_TABLET | Freq: Two times a day (BID) | ORAL | 2 refills | Status: DC

## 2018-07-24 NOTE — Telephone Encounter (Signed)
Patient called said that the med dicyclomine (BENTYL) 20 MG is not working very well no more and would like to know if she can get something else before Friday due to her going out of town for 2 weeks.

## 2018-07-24 NOTE — Telephone Encounter (Signed)
Pt advised to discontinue Dicyclomine.  New prescription for Glycopyrrolate sent to pharmacy.  Pt aware.

## 2018-07-24 NOTE — Telephone Encounter (Signed)
Glycopyrrolate 2 mg po bid, #60, 2 refills DC dicyclomine

## 2018-08-09 ENCOUNTER — Telehealth: Payer: Self-pay | Admitting: Gastroenterology

## 2018-08-09 NOTE — Telephone Encounter (Signed)
Called Used to have more diarrhea Started on Glycopyrrolate  Now with more constipation.  Passing flatus.  Minimal abdominal discomfort.  No nausea or vomiting.  No fever or chills. No bowel movement for 8 days.  Just realized when she came back from the beach  Plan: -Start MiraLAX 17 g p.o. twice daily until first good bowel movement, then MiraLAX 17 g p.o. once a day. -Can hold off on Glycopyrrolate  -Drink plenty of water -Let us know if still with problems.   RG   Sheri, Can you please call her on Monday and find out how she is doing. Thanks  RG

## 2018-08-12 NOTE — Telephone Encounter (Signed)
Patient reports large results from Miralax.  Patient reports she is feeling better.  She will call back for any additional questions or concerns.

## 2018-08-20 DIAGNOSIS — A630 Anogenital (venereal) warts: Secondary | ICD-10-CM | POA: Diagnosis not present

## 2018-09-26 ENCOUNTER — Other Ambulatory Visit: Payer: Self-pay | Admitting: Interventional Radiology

## 2018-09-26 DIAGNOSIS — D1771 Benign lipomatous neoplasm of kidney: Secondary | ICD-10-CM

## 2018-10-04 ENCOUNTER — Ambulatory Visit (HOSPITAL_COMMUNITY): Payer: Medicare Other

## 2018-10-07 DIAGNOSIS — A630 Anogenital (venereal) warts: Secondary | ICD-10-CM | POA: Diagnosis not present

## 2018-10-08 ENCOUNTER — Ambulatory Visit (HOSPITAL_COMMUNITY)
Admission: RE | Admit: 2018-10-08 | Discharge: 2018-10-08 | Disposition: A | Discharge status: Home / Self Care | Payer: Medicare Other | Source: Ambulatory Visit | Attending: Interventional Radiology | Admitting: Interventional Radiology

## 2018-10-08 ENCOUNTER — Ambulatory Visit
Admission: RE | Admit: 2018-10-08 | Discharge: 2018-10-08 | Disposition: A | Discharge status: Home / Self Care | Payer: Medicare Other | Source: Ambulatory Visit | Attending: Interventional Radiology | Admitting: Interventional Radiology

## 2018-10-08 ENCOUNTER — Other Ambulatory Visit: Payer: Self-pay

## 2018-10-08 ENCOUNTER — Encounter: Payer: Self-pay | Admitting: *Deleted

## 2018-10-08 DIAGNOSIS — D1771 Benign lipomatous neoplasm of kidney: Secondary | ICD-10-CM | POA: Diagnosis not present

## 2018-10-08 HISTORY — PX: IR RADIOLOGIST EVAL & MGMT: IMG5224

## 2018-10-08 LAB — POCT I-STAT CREATININE: Creatinine, Ser: 1.4 mg/dL — ABNORMAL HIGH (ref 0.44–1.00)

## 2018-10-08 MED ORDER — GADOBUTROL 1 MMOL/ML IV SOLN
10.0000 mL | Freq: Once | INTRAVENOUS | Status: AC | PRN
  Administered 2018-10-08: 08:00:00 10 mL via INTRAVENOUS

## 2018-10-08 NOTE — Progress Notes (Signed)
Patient ID: Sierra Banks, female   DOB: 11-May-1950, 68 y.o.   MRN: [id number]         Chief Complaint: Post right-sided renal AML embolization  Referring Physician(s): Borden  History of Present Illness: Sierra Banks is a 68 y.o. female with past medical history significant for hypertension, hyperlipidemia, obesity, GERD and depression who underwent a technically successful right-sided renal AML embolization on 10/24/2016 and is seen today via telemedicine consultation following acquisition of nearly 2-year post embolization surveillance renal MRI.  Patient is again without complaint.  Specifically, no flank pain, hematuria, dysuria, fever or chills.  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/01/2009   History of kidney stones 04/05/2016   HYPERLIPIDEMIA 12/23/2007   HYPERTENSION 08/28/2006   Hypothyroidism    not as tired as she was   Overweight(278.02) 01/12/2009    Past Surgical History:  Procedure Laterality Date   CARPAL TUNNEL RELEASE     LEFT  2002   CHOLECYSTECTOMY N/A 12/12/2016   Procedure: LAPAROSCOPIC CHOLECYSTECTOMY;  Surgeon: Rolm Bookbinder, MD;  Location: [facility];  Service: General;  Laterality: N/A;   DILATION AND CURETTAGE OF UTERUS     2011   fatty tumor removed from right arm  2016   IR EMBO TUMOR ORGAN ISCHEMIA INFARCT INC GUIDE ROADMAPPING  10/24/2016   IR RADIOLOGIST EVAL & MGMT  10/12/2016   IR RADIOLOGIST EVAL & MGMT  11/16/2016   IR RADIOLOGIST EVAL & MGMT  02/15/2017   IR RADIOLOGIST EVAL & MGMT  08/15/2017   IR RENAL SUPRASEL UNI S&I MOD SED  10/24/2016   IR RENAL SUPRASEL UNI S&I MOD SED  10/24/2016   IR US GUIDE VASC ACCESS RIGHT  10/24/2016   JOINT REPLACEMENT Left    MASS EXCISION Right  08/21/2014   Procedure: RIGHT FOREARM MASS EXCISION WITH REPAIR AND RECONSTRUCTION;  Surgeon: Roseanne Kaufman, MD;  Location: [place];  Service: Orthopedics;  Laterality: Right;   TONSILLECTOMY     1969   TOTAL KNEE ARTHROPLASTY  05/08/2011   Procedure: TOTAL KNEE ARTHROPLASTY;  Surgeon: Rudean Haskell, MD;  Location: [place];  Service: Orthopedics;  Laterality: Left;   TUBAL LIGATION     1980    Allergies: Patient has no known allergies.  Medications: Prior to Admission medications   Medication Sig Start Date End Date Taking? Authorizing Provider  amLODipine (NORVASC) 10 MG tablet TAKE 1 TABLET BY MOUTH EVERY MORNING Patient taking differently: Take 5 mg by mouth every morning 04/11/12   Dorena Cookey, MD  atenolol (TENORMIN) 50 MG tablet Take 100 mg by mouth daily.     [provider]  buPROPion (WELLBUTRIN) 75 MG tablet TAKE 1 TABLET AT BEDTIME Patient taking differently: TAKE 75 MG BY MOUTH AT BEDTIME 09/20/12   Dorena Cookey, MD  cloNIDine (CATAPRES) 0.1 MG tablet Take 0.2 mg by mouth at bedtime.     [provider]  furosemide (LASIX) 40 MG tablet TAKE 1 TABLET BY MOUTH EVERY MORNING Patient taking differently: TAKE 40 MG BY MOUTH EVERY MORNING 09/20/12   Dorena Cookey, MD  glycopyrrolate (ROBINUL) 2 MG tablet Take 1 tablet (2 mg total) by mouth 2 (two) times  daily. 07/24/18   Ladene Artist, MD  HYDROcodone-acetaminophen (NORCO/VICODIN) 5-325 MG tablet Take 1-2 tablets by mouth every 4 (four) hours as needed for moderate pain. 10/25/16   Regalado, Belkys A, MD  ibuprofen (ADVIL,MOTRIN) 200 MG tablet Take 800 mg by mouth every 6 (six) hours as needed for headache or moderate pain.    [provider]  levothyroxine (SYNTHROID, LEVOTHROID) 50 MCG tablet Take 50 mcg by mouth daily before breakfast.     [provider]  losartan (COZAAR) 100 MG tablet Take 100 mg by mouth daily.    [provider]  Multiple Vitamin  (MULITIVITAMIN WITH MINERALS) TABS Take 2 tablets by mouth daily.     [provider]  simvastatin (ZOCOR) 40 MG tablet Take 40 mg by mouth daily.  03/28/11   Dorena Cookey, MD  topiramate (TOPAMAX) 25 MG tablet Take 50 mg by mouth daily.     [provider]  venlafaxine XR (EFFEXOR-XR) 150 MG 24 hr capsule TAKE ONE CAPSULE BY MOUTH TWICE A DAY Patient taking differently: TAKE 300 MG BY MOUTH IN THE EVENING 06/18/12   Dorena Cookey, MD     Family History  Problem Relation Age of Onset   Colon polyps Sister    Colon cancer Maternal Uncle    Colon cancer Maternal Grandfather    Heart disease Mother    Hypertension Mother     Social History   Socioeconomic History   Marital status: Married    Spouse name: Not on file   Number of children: Not on file   Years of education: Not on file   Highest education level: Not on file  Occupational History   Not on file  Social Needs   Financial resource strain: Not on file   Food insecurity    Worry: Not on file    Inability: Not on file   Transportation needs    Medical: Not on file    Non-medical: Not on file  Tobacco Use   Smoking status: Never Smoker   Smokeless tobacco: Never Used  Substance and Sexual Activity   Alcohol use: No   Drug use: No   Sexual activity: Not on file  Lifestyle   Physical activity    Days per week: Not on file    Minutes per session: Not on file   Stress: Not on file  Relationships   Social connections    Talks on phone: Not on file    Gets together: Not on file    Attends religious service: Not on file    Active member of club or organization: Not on file    Attends meetings of clubs or organizations: Not on file    Relationship status: Not on file  Other Topics Concern   Not on file  Social History Narrative   Not on file    ECOG Status: 0 - Asymptomatic  Review of Systems  Review of Systems: A 12 point ROS discussed and pertinent positives are  indicated in the HPI above.  All other systems are negative.  Physical Exam No direct physical exam was performed (except for noted visual exam findings with Video Visits).   Vital Signs: There were no vitals taken for this visit.  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 CONTRAST:  10 cc Gadavist COMPARISON:  Multiple exams, including prior MRI examinations from 01/29/2017 and 08/28/2016 FINDINGS: Lower chest: Mild cardiomegaly. Hepatobiliary: Diffuse hepatic steatosis.  Cholecystectomy. Pancreas:  Unremarkable Spleen:  Unremarkable Adrenals/Urinary Tract:  The adrenal glands appear normal. The previously treated angiomyolipoma extending in the left renal sinus is observed and by my measurements measures 6.2 by 5.1 by 5.8 cm (volume = 96 cm^3). Pre embolization the lesion measured 7.4 by 6.7 by 7.6 cm (volume = 200 cm^3), and on the more recent prior exam from 01/29/2017 the lesion measured 6.4 by 5.8 by 7.0 cm (volume = 140 cm^3). It is worth noting that a considerable central portion of this lesion is purely fatty at this point, with the mildly enhancing component mainly along a rim which measures up to about 1.4 cm in thickness. This purely fatty central portion of the lesion measures about 3.6 by 2.8 by 4.4 cm (volume = 23 cm^3). Another important component of the lesion is the more notably vascular enhancing portion along the lower medial portion of the lesion which currently measures 0.8 by 1.2 by 1.5 cm (volume = 0.8 cm^3). Preoperatively this measured 3.0 by 2.0 by 2.7 cm (volume = 8.5 cm^3), and on 01/29/2017 this portion measured 2.0 by 1.2 by 1.7 cm (volume = 2.1 cm^3). There is no evidence of interval hemorrhage or interval  complicating feature. The left kidney appears unremarkable. Stomach/Bowel: Unremarkable Vascular/Lymphatic:  Aortoiliac atherosclerotic vascular disease. Other:  No supplemental non-categorized findings. Musculoskeletal: Lumbar spondylosis and degenerative disc disease with degenerative endplate findings most notable at L2-3 and L4-5. IMPRESSION: 1. Post therapy related findings in the previously embolized right renal sinus angiomyolipoma, without complicating feature. The overall volume of the lesion his decreased by 52% compared to pre embolization MRI of 08/28/2016, and by 31% compared to the MRI exam from 01/29/2017. Moreover, 24% of the volume of the lesion is purely fatty (nonenhancing) in the center. Finally, the enhancing nodular portion of the lesion has decreased in volume by 91% compared to pre embolization, and by 61% compared to the exam from 01/29/2017. 2.  Aortic Atherosclerosis (ICD10-I70.0). Electronically Signed   By: Van Clines M.D.   On: 10/08/2018 08:21    Labs:  CBC: Recent Labs    05/09/18 1328  WBC 9.1  HGB 14.3  HCT 41.7  PLT 328.0    COAGS: No results for input(s): INR, APTT in the last 8760 hours.  BMP: Recent Labs    05/09/18 1328 10/08/18 0728  NA 142  --   K 3.1*  --   CL 102  --   CO2 31  --   GLUCOSE 127*  --   BUN 12  --   CALCIUM 9.5  --   CREATININE 1.19 1.40*    LIVER FUNCTION TESTS: Recent Labs    05/09/18 1328  BILITOT 0.5  AST 22  ALT 33  ALKPHOS 93  PROT 7.1  ALBUMIN 4.2    TUMOR MARKERS: No results for input(s): AFPTM, CEA, CA199, CHROMGRNA in the last 8760 hours.  Assessment and Plan:  Sierra Banks is a 68 y.o. female with past medical history significant for hypertension, hyperlipidemia, obesity, GERD and depression who underwent a technically successful right-sided renal AML embolization on 10/24/2016 and is seen today via telemedicine consultation following acquisition of nearly 2-year post embolization  surveillance renal MRI.  Patient is again without complaint.    Personal review of abdominal MRI performed earlier today demonstrates continued involution  of embolized right-sided renal AML, particularly, there has been significant (estimated approximately 50%) reduction in the size of the right-sided renal AML and, potentially more importantly, approximately 90% reduction in size of nodular enhancing portion compared to preprocedural MRI performed 08/28/2016.  Benefits and risks of obtaining a 3-year surveillance abdominal MRI was discussed with the patient however it ultimately was decided to defer dedicated surveillance at this time.    I feel that given significant reduction in size of the lesion and particularly its angiogenic component, this is a very reasonable decision for this incidentally discovered lesion. I explained that while unlikely, unrelenting right-sided flank pain could herald a spontaneous bleed though again, given the technically excellent result of the embolization, I feel this is highly unlikely to occur.  Patient demonstrated excellent understanding of the above discussion and knows to call the interventional radiology clinic with any future questions or concerns, but may otherwise follow-up on a PRN basis  A copy of this report was sent to the requesting provider on this date.  Electronically Signed: Sandi Mariscal 10/08/2018, 9:23 AM   I spent a total of 10 Minutes in remote  clinical consultation, greater than 50% of which was counseling/coordinating care for post right-sided renal AML embolization.    Visit type: Audio only (telephone). Audio (no video) only due to patient's lack of internet/smartphone capability. Alternative for in-person consultation at [facility], [address], [place], [place]. This visit type was conducted due to national recommendations for restrictions regarding the COVID-19 Pandemic (e.g. social distancing).  This format is felt to  be most appropriate for this patient at this time.  All issues noted in this document were discussed and addressed.

## 2018-10-13 IMAGING — CT CT ABD-PELV W/O CM
2 of 4 series · 13 of 36 positions shown, 18 images · non-contrast
Comparison: None.

CLINICAL DATA: Left flank pain for 4 days.  Nausea.

EXAM:
CT ABDOMEN AND PELVIS WITHOUT CONTRAST
TECHNIQUE: Multidetector CT imaging of the abdomen and pelvis was performed
following the standard protocol without IV contrast.

[Series 601: coronal body · coronal · 0.92mm/px · 1 of 128 slices shown, 2 images]
[im 43/128  soft-tissue]
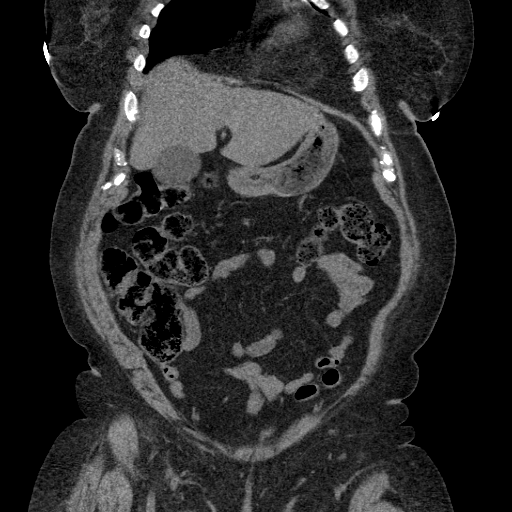
[im 43/128  bone]
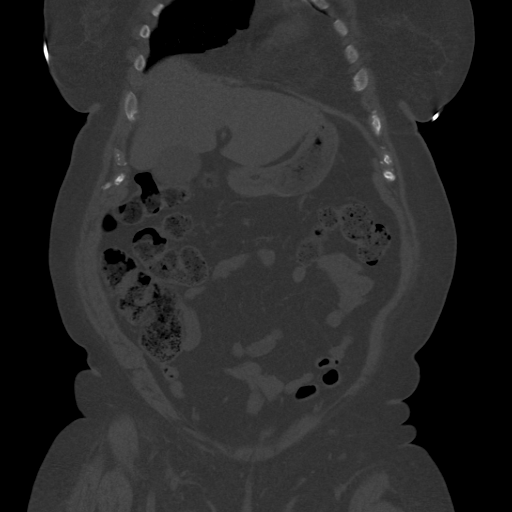

[Series 602: sagittal body · sagittal · 0.92mm/px · 12 of 176 slices shown, 16 images]
[im 11/176  soft-tissue]
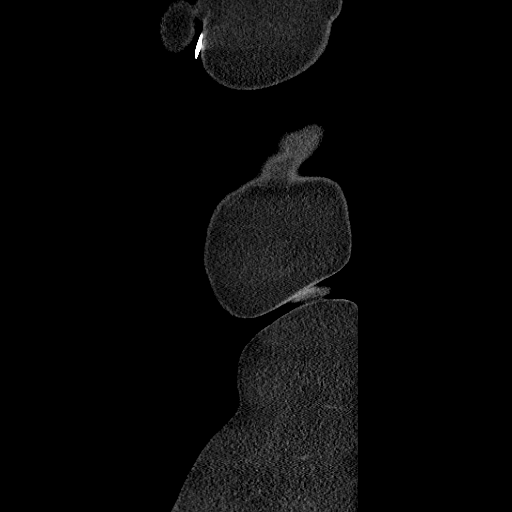
[im 11/176  lung]
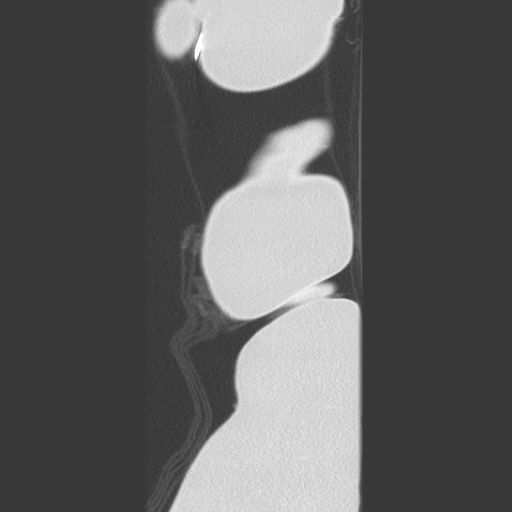
[im 11/176  bone]
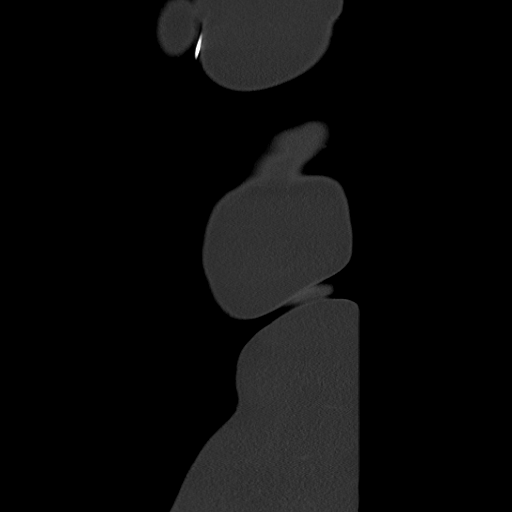
[im 21/176  lung]
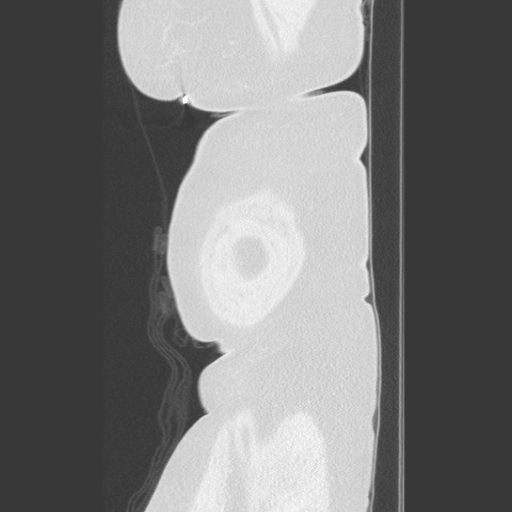
[im 31/176  soft-tissue]
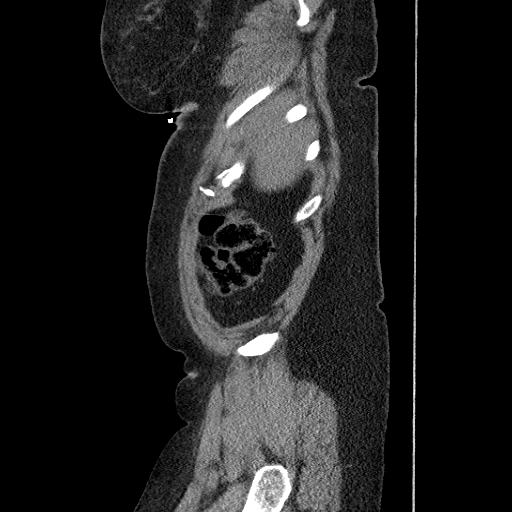
[im 31/176  lung]
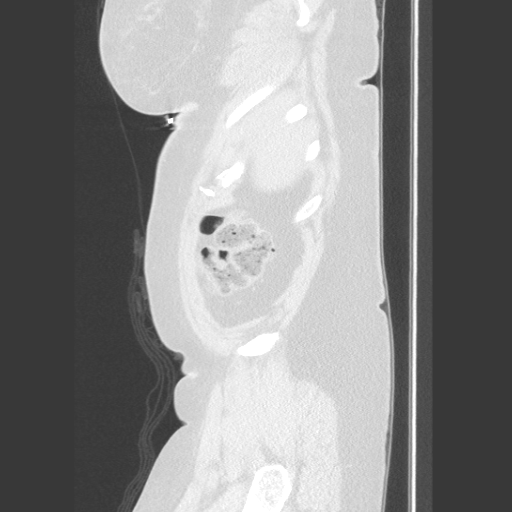
[im 42/176  lung]
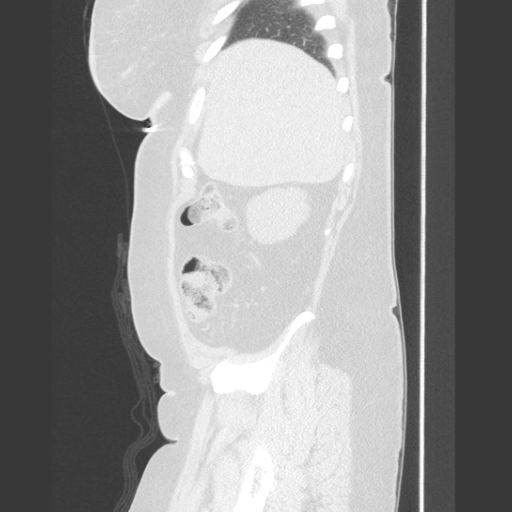
[im 52/176  soft-tissue]
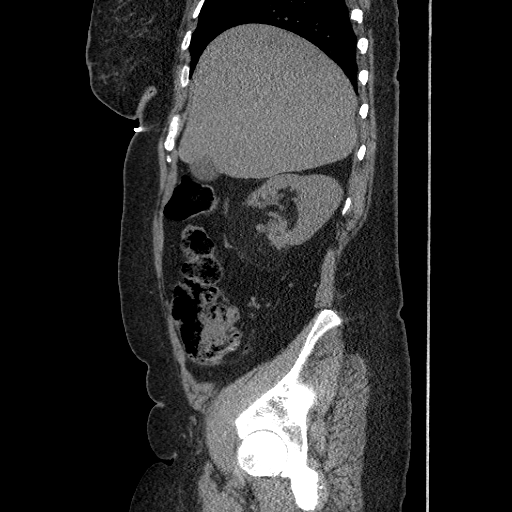
[im 62/176  soft-tissue]
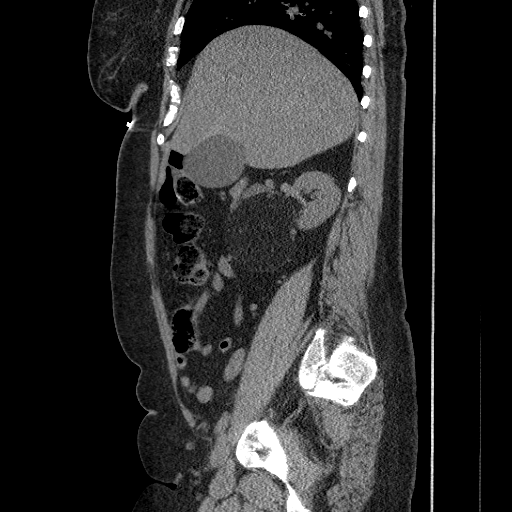
[im 83/176  soft-tissue]
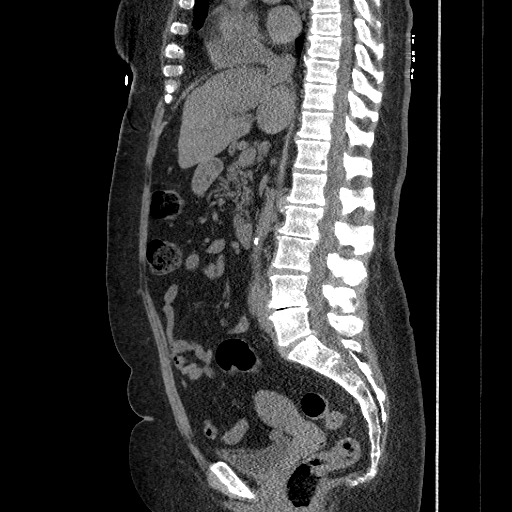
[im 93/176  soft-tissue]
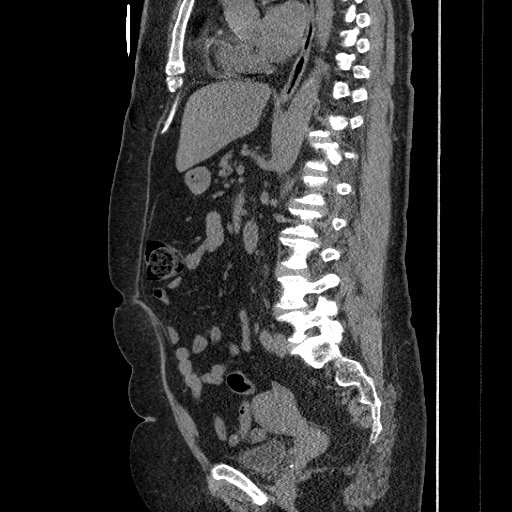
[im 114/176  soft-tissue]
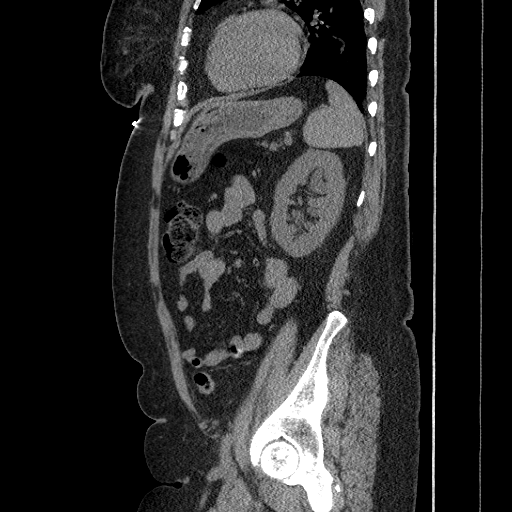
[im 134/176  soft-tissue]
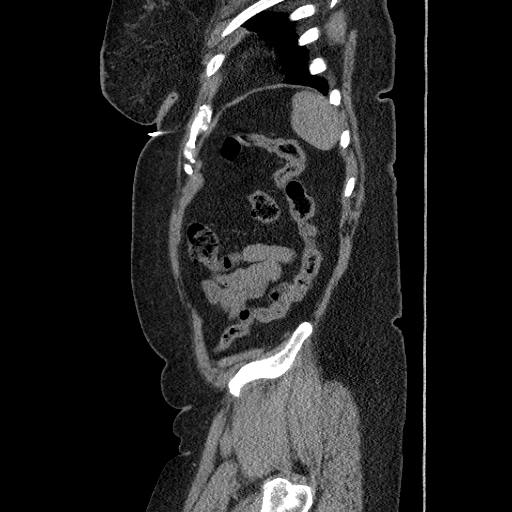
[im 145/176  soft-tissue]
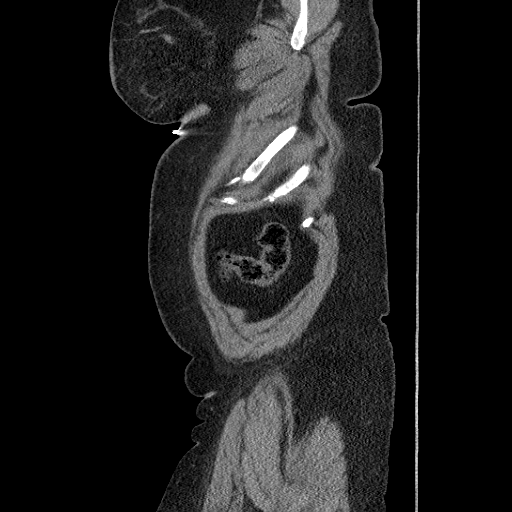
[im 145/176  bone]
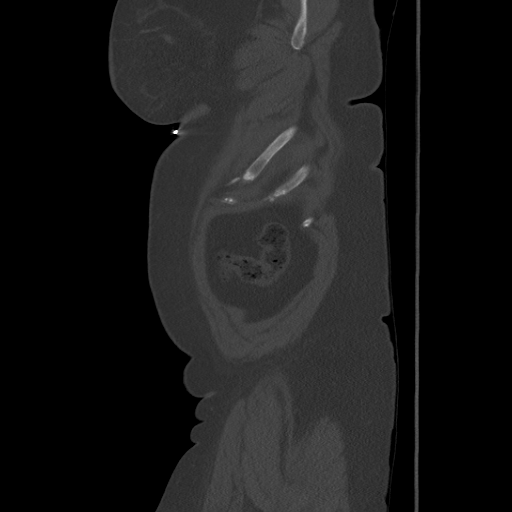
[im 165/176  soft-tissue]
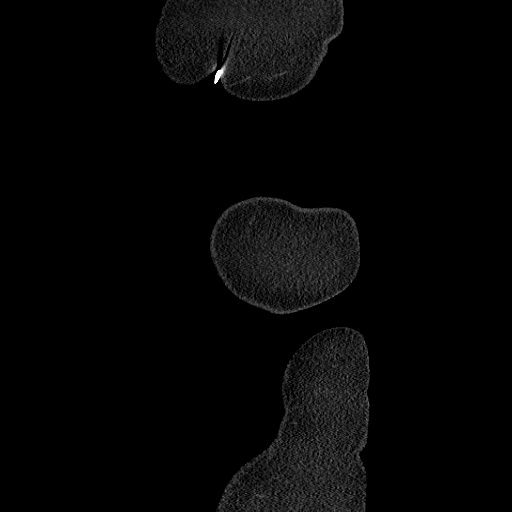

[13 of 36 positions shown; findings below may reference images not displayed]

FINDINGS: Lower chest: Ground-glass attenuation of the lung bases bilaterally
likely reflects atelectasis. Edema is considered less likely. The
heart is mildly enlarged. Coronary artery calcifications are
present. No significant pleural or pericardial effusion is present.

Hepatobiliary: There is diffuse fatty infiltration liver. No
discrete lesions are present. Layering gallstones are present at the
gallbladder without inflammatory change. The common bile duct is
within normal limits.

Pancreas: There is mild fatty infiltration of the pancreas. No
discrete lesions are present. There are no inflammatory changes.

Spleen: Within normal limits.

Adrenals/Urinary Tract: The adrenal glands are normal bilaterally. A
fat containing mass is present at the lower pole of the left kidney
measuring 5.7 x 6.7 x 8.0 cm. This extends into the central hilum of
the right kidney.

Moderate left-sided hydronephrosis is present. The left ureter is
dilated to the level of a distal obstructing 4 mm stone. The urinary
bladder is otherwise unremarkable.

Stomach/Bowel: The stomach and duodenum are within normal limits.
The small bowel is unremarkable. The appendix is visualized and
normal. The ascending and transverse colon are within normal limits.
Diverticular changes are present in the sigmoid colon without
inflammation to suggest diverticulitis. The distal sigmoid colon and
rectum are within normal limits.

Vascular/Lymphatic: Atherosclerotic calcifications are present in
the aorta without aneurysm. No significant adenopathy is present.

Reproductive: Within normal limits for age.

Other: No significant free fluid or free air is present.

Musculoskeletal: There is chronic loss of disc height at L1-2, L2-3,
L4-5, and L5-S1. No focal lytic or blastic lesions are present.
Vertebral body heights are maintained. The bony pelvis is intact.
IMPRESSION: 1. Obstructing 4 mm stone in the distal left ureter with moderate
left-sided hydronephrosis.
2. 8 cm fat containing lesion at the lower pole of the right kidney
extending into the hilum likely representing in an angiomyolipoma.
Recommend consultation with urology and follow-up CT or MRI 3- 6
months to assure stability.
3. Sigmoid diverticulosis without diverticulitis.
4. Cholelithiasis without cholecystitis.
5. Atherosclerosis.

## 2018-11-14 DIAGNOSIS — Z23 Encounter for immunization: Secondary | ICD-10-CM | POA: Diagnosis not present

## 2019-01-03 ENCOUNTER — Other Ambulatory Visit: Payer: Self-pay

## 2019-01-03 MED ORDER — GLYCOPYRROLATE 2 MG PO TABS: 2.0000 mg | ORAL_TABLET | Freq: Two times a day (BID) | ORAL | 2 refills | Status: DC

## 2019-01-08 DIAGNOSIS — H353121 Nonexudative age-related macular degeneration, left eye, early dry stage: Secondary | ICD-10-CM | POA: Diagnosis not present

## 2019-01-08 DIAGNOSIS — H2513 Age-related nuclear cataract, bilateral: Secondary | ICD-10-CM | POA: Diagnosis not present

## 2019-01-08 DIAGNOSIS — H0100A Unspecified blepharitis right eye, upper and lower eyelids: Secondary | ICD-10-CM | POA: Diagnosis not present

## 2019-02-25 DIAGNOSIS — R509 Fever, unspecified: Secondary | ICD-10-CM | POA: Diagnosis not present

## 2019-02-26 ENCOUNTER — Other Ambulatory Visit: Payer: Self-pay | Admitting: Nurse Practitioner

## 2019-02-26 ENCOUNTER — Encounter (HOSPITAL_COMMUNITY): Payer: Self-pay

## 2019-02-26 DIAGNOSIS — I10 Essential (primary) hypertension: Secondary | ICD-10-CM

## 2019-02-26 DIAGNOSIS — U071 COVID-19: Secondary | ICD-10-CM

## 2019-02-26 NOTE — Progress Notes (Signed)
  I connected by phone with Sierra Banks on 02/26/2019 at 7:24 AM to discuss the potential use of an new treatment for mild to moderate COVID-19 viral infection in non-hospitalized patients.  This patient is a 69 y.o. female that meets the FDA criteria for Emergency Use Authorization of bamlanivimab or casirivimab\imdevimab.  Has a (+) direct SARS-CoV-2 viral test result  Has mild or moderate COVID-19   Is ? 70 years of age and weighs ? 40 kg  Is NOT hospitalized due to COVID-19  Is NOT requiring oxygen therapy or requiring an increase in baseline oxygen flow rate due to COVID-19  Is within 10 days of symptom onset  Has at least one of the high risk factor(s) for progression to severe COVID-19 and/or hospitalization as defined in EUA.  Specific high risk criteria : Hypertension   I have spoken and communicated the following to the patient or parent/caregiver:  1. FDA has authorized the emergency use of bamlanivimab and casirivimab\imdevimab for the treatment of mild to moderate COVID-19 in adults and pediatric patients with positive results of direct SARS-CoV-2 viral testing who are 64 years of age and older weighing at least 40 kg, and who are at high risk for progressing to severe COVID-19 and/or hospitalization.  2. The significant known and potential risks and benefits of bamlanivimab and casirivimab\imdevimab, and the extent to which such potential risks and benefits are unknown.  3. Information on available alternative treatments and the risks and benefits of those alternatives, including clinical trials.  4. Patients treated with bamlanivimab and casirivimab\imdevimab should continue to self-isolate and use infection control measures (e.g., wear mask, isolate, social distance, avoid sharing personal items, clean and disinfect "high touch" surfaces, and frequent handwashing) according to CDC guidelines.   5. The patient or parent/caregiver has the option to accept or refuse  bamlanivimab or casirivimab\imdevimab .  After reviewing this information with the patient, The patient agreed to proceed with receiving the bamlanimivab infusion and will be provided a copy of the Fact sheet prior to receiving the infusion.Fenton Foy 02/26/2019 7:24 AM

## 2019-02-27 ENCOUNTER — Ambulatory Visit (HOSPITAL_COMMUNITY)
Admission: RE | Admit: 2019-02-27 | Discharge: 2019-02-27 | Disposition: A | Discharge status: Home / Self Care | Payer: Medicare Other | Source: Ambulatory Visit | Attending: Pulmonary Disease | Admitting: Pulmonary Disease

## 2019-02-27 DIAGNOSIS — Z23 Encounter for immunization: Secondary | ICD-10-CM | POA: Diagnosis not present

## 2019-02-27 DIAGNOSIS — I10 Essential (primary) hypertension: Secondary | ICD-10-CM | POA: Diagnosis not present

## 2019-02-27 DIAGNOSIS — U071 COVID-19: Secondary | ICD-10-CM

## 2019-02-27 MED ORDER — DIPHENHYDRAMINE HCL 50 MG/ML IJ SOLN: 50.0000 mg | Freq: Once | INTRAMUSCULAR | Status: DC | PRN

## 2019-02-27 MED ORDER — ALBUTEROL SULFATE HFA 108 (90 BASE) MCG/ACT IN AERS: 2.0000 | INHALATION_SPRAY | Freq: Once | RESPIRATORY_TRACT | Status: DC | PRN

## 2019-02-27 MED ORDER — METHYLPREDNISOLONE SODIUM SUCC 125 MG IJ SOLR: 125.0000 mg | Freq: Once | INTRAMUSCULAR | Status: DC | PRN

## 2019-02-27 MED ORDER — SODIUM CHLORIDE 0.9 % IV SOLN
700.0000 mg | Freq: Once | INTRAVENOUS | Status: AC
  Administered 2019-02-27: 11:00:00 700 mg via INTRAVENOUS
  Filled 2019-02-27: qty 20

## 2019-02-27 MED ORDER — EPINEPHRINE 0.3 MG/0.3ML IJ SOAJ: 0.3000 mg | Freq: Once | INTRAMUSCULAR | Status: DC | PRN

## 2019-02-27 MED ORDER — FAMOTIDINE IN NACL 20-0.9 MG/50ML-% IV SOLN: 20.0000 mg | Freq: Once | INTRAVENOUS | Status: DC | PRN

## 2019-02-27 MED ORDER — SODIUM CHLORIDE 0.9 % IV SOLN
INTRAVENOUS | Status: DC | PRN
  Administered 2019-02-27: 250 mL via INTRAVENOUS

## 2019-02-27 NOTE — Progress Notes (Signed)
  Diagnosis: COVID-19  Physician: Dr. Joya Gaskins  Procedure: [facility] Med: bamlanivimab infusion - Provided patient with bamlanimivab fact sheet for patients, parents and caregivers prior to infusion.  Complications: No immediate complications noted.  Discharge: Discharged home   [place], [place] 02/27/2019

## 2019-02-27 NOTE — Discharge Instructions (Signed)

## 2019-02-28 ENCOUNTER — Ambulatory Visit (HOSPITAL_COMMUNITY): Payer: Medicare Other

## 2019-03-10 DIAGNOSIS — Z79899 Other long term (current) drug therapy: Secondary | ICD-10-CM | POA: Diagnosis not present

## 2019-03-10 DIAGNOSIS — B342 Coronavirus infection, unspecified: Secondary | ICD-10-CM | POA: Diagnosis not present

## 2019-03-10 DIAGNOSIS — H6983 Other specified disorders of Eustachian tube, bilateral: Secondary | ICD-10-CM | POA: Diagnosis not present

## 2019-03-10 DIAGNOSIS — Z0000 Encounter for general adult medical examination without abnormal findings: Secondary | ICD-10-CM | POA: Diagnosis not present

## 2019-03-10 DIAGNOSIS — F5109 Other insomnia not due to a substance or known physiological condition: Secondary | ICD-10-CM | POA: Diagnosis not present

## 2019-03-10 DIAGNOSIS — I10 Essential (primary) hypertension: Secondary | ICD-10-CM | POA: Diagnosis not present

## 2019-03-10 DIAGNOSIS — F322 Major depressive disorder, single episode, severe without psychotic features: Secondary | ICD-10-CM | POA: Diagnosis not present

## 2019-03-10 DIAGNOSIS — U071 COVID-19: Secondary | ICD-10-CM | POA: Diagnosis not present

## 2019-03-10 DIAGNOSIS — Z1389 Encounter for screening for other disorder: Secondary | ICD-10-CM | POA: Diagnosis not present

## 2019-03-10 DIAGNOSIS — E039 Hypothyroidism, unspecified: Secondary | ICD-10-CM | POA: Diagnosis not present

## 2019-03-10 DIAGNOSIS — Z23 Encounter for immunization: Secondary | ICD-10-CM | POA: Diagnosis not present

## 2019-03-28 ENCOUNTER — Other Ambulatory Visit: Payer: Self-pay

## 2019-03-28 MED ORDER — GLYCOPYRROLATE 2 MG PO TABS: 2.0000 mg | ORAL_TABLET | Freq: Two times a day (BID) | ORAL | 2 refills | Status: DC

## 2019-04-15 DIAGNOSIS — K5900 Constipation, unspecified: Secondary | ICD-10-CM | POA: Diagnosis not present

## 2019-04-15 DIAGNOSIS — H6522 Chronic serous otitis media, left ear: Secondary | ICD-10-CM | POA: Diagnosis not present

## 2019-04-15 DIAGNOSIS — R1013 Epigastric pain: Secondary | ICD-10-CM | POA: Diagnosis not present

## 2019-04-15 DIAGNOSIS — R1012 Left upper quadrant pain: Secondary | ICD-10-CM | POA: Diagnosis not present

## 2019-04-15 DIAGNOSIS — J309 Allergic rhinitis, unspecified: Secondary | ICD-10-CM | POA: Diagnosis not present

## 2019-05-28 DIAGNOSIS — Z23 Encounter for immunization: Secondary | ICD-10-CM | POA: Diagnosis not present

## 2019-06-18 DIAGNOSIS — Z23 Encounter for immunization: Secondary | ICD-10-CM | POA: Diagnosis not present

## 2019-08-07 ENCOUNTER — Other Ambulatory Visit: Payer: Self-pay | Admitting: Interventional Radiology

## 2019-08-07 DIAGNOSIS — D1771 Benign lipomatous neoplasm of kidney: Secondary | ICD-10-CM

## 2019-08-29 ENCOUNTER — Other Ambulatory Visit: Payer: Self-pay

## 2019-08-29 ENCOUNTER — Ambulatory Visit (HOSPITAL_COMMUNITY)
Admission: RE | Admit: 2019-08-29 | Discharge: 2019-08-29 | Disposition: A | Discharge status: Home / Self Care | Payer: Medicare Other | Source: Ambulatory Visit | Attending: Interventional Radiology | Admitting: Interventional Radiology

## 2019-08-29 DIAGNOSIS — D1771 Benign lipomatous neoplasm of kidney: Secondary | ICD-10-CM | POA: Diagnosis not present

## 2019-08-29 DIAGNOSIS — I700 Atherosclerosis of aorta: Secondary | ICD-10-CM | POA: Diagnosis not present

## 2019-08-29 DIAGNOSIS — R600 Localized edema: Secondary | ICD-10-CM | POA: Diagnosis not present

## 2019-08-29 DIAGNOSIS — Z9049 Acquired absence of other specified parts of digestive tract: Secondary | ICD-10-CM | POA: Diagnosis not present

## 2019-08-29 MED ORDER — GADOBUTROL 1 MMOL/ML IV SOLN
10.0000 mL | Freq: Once | INTRAVENOUS | Status: AC | PRN
  Administered 2019-08-29: 10 mL via INTRAVENOUS

## 2019-09-02 ENCOUNTER — Encounter: Payer: Self-pay | Admitting: *Deleted

## 2019-09-02 ENCOUNTER — Ambulatory Visit
Admission: RE | Admit: 2019-09-02 | Discharge: 2019-09-02 | Disposition: A | Discharge status: Home / Self Care | Payer: Medicare Other | Source: Ambulatory Visit | Attending: Interventional Radiology | Admitting: Interventional Radiology

## 2019-09-02 ENCOUNTER — Other Ambulatory Visit: Payer: Self-pay

## 2019-09-02 DIAGNOSIS — D1771 Benign lipomatous neoplasm of kidney: Secondary | ICD-10-CM

## 2019-09-02 HISTORY — PX: IR RADIOLOGIST EVAL & MGMT: IMG5224

## 2019-09-02 NOTE — Progress Notes (Signed)
Patient ID: Sierra Banks, female   DOB: 1950/08/15, 69 y.o.   MRN: [id number]         Chief Complaint: Post right-sided renal AML embolization  Referring Physician(s): Borden  History of Present Illness: Sierra Banks is a 69 y.o. female with past medical history significant for hypertension, hyperlipidemia, obesity, GERD and depression who underwent a technically successful right-sided renal AML embolization on 10/24/2016 and is seen today via telemedicine consultation following acquisition of nearly 3-year post embolization surveillance renal MRI.  Patient is again without complaint.  Specifically, no flank pain, hematuria, dysuria, fever or chills.   Past Medical History:  Diagnosis Date  . ABSCESS, BREAST, RIGHT 10/04/2009  . Allergic rhinitis   . DEPRESSION 08/28/2006  . Dysrhythmia    HEART PALPITATIONS   . GERD (gastroesophageal reflux disease)    OCC TUMS   . Hepatitis    JAUNDICE AGE 58  . 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 History of kidney stones 04/05/2016  . HYPERLIPIDEMIA 12/23/2007  . HYPERTENSION 08/28/2006  . Hypothyroidism    not as tired as she was  . Overweight(278.02) 01/12/2009    Past Surgical History:  Procedure Laterality Date  . CARPAL TUNNEL RELEASE     LEFT  2002  . CHOLECYSTECTOMY N/A 12/12/2016   Procedure: LAPAROSCOPIC CHOLECYSTECTOMY;  Surgeon: Rolm Bookbinder, MD;  Location: [facility];  Service: General;  Laterality: N/A;  . DILATION AND CURETTAGE OF UTERUS     2011  . fatty tumor removed from right arm  2016  . IR EMBO TUMOR ORGAN ISCHEMIA INFARCT INC GUIDE ROADMAPPING  10/24/2016  . IR RADIOLOGIST EVAL & MGMT  10/12/2016  . IR RADIOLOGIST EVAL & MGMT  11/16/2016  . IR RADIOLOGIST EVAL & MGMT  02/15/2017  . IR RADIOLOGIST EVAL & MGMT  08/15/2017  . IR RADIOLOGIST EVAL & MGMT  10/08/2018  . IR RENAL SUPRASEL UNI S&I MOD SED  10/24/2016  . IR RENAL SUPRASEL UNI S&I MOD SED  10/24/2016  . IR US GUIDE VASC ACCESS RIGHT  10/24/2016  . JOINT  REPLACEMENT Left   . MASS EXCISION Right 08/21/2014   Procedure: RIGHT FOREARM MASS EXCISION WITH REPAIR AND RECONSTRUCTION;  Surgeon: Roseanne Kaufman, MD;  Location: [facility];  Service: Orthopedics;  Laterality: Right;  . TONSILLECTOMY     1969  . TOTAL KNEE ARTHROPLASTY  05/08/2011   Procedure: TOTAL KNEE ARTHROPLASTY;  Surgeon: Rudean Haskell, MD;  Location: [facility];  Service: Orthopedics;  Laterality: Left;  . TUBAL LIGATION     1980    Allergies: Patient has no known allergies.  Medications: Prior to Admission medications   Medication Sig Start Date End Date Taking? Authorizing Provider  amLODipine (NORVASC) 10 MG tablet TAKE 1 TABLET BY MOUTH EVERY MORNING Patient taking differently: Take 5 mg by mouth every morning 04/11/12   Dorena Cookey, MD  atenolol (TENORMIN) 50 MG tablet Take 100 mg by mouth daily.     [provider]  buPROPion (WELLBUTRIN) 75 MG tablet TAKE 1 TABLET AT BEDTIME Patient taking differently: TAKE 75 MG BY MOUTH AT BEDTIME 09/20/12   Dorena Cookey, MD  cloNIDine (CATAPRES) 0.1 MG tablet Take 0.2 mg by mouth at bedtime.     [provider]  furosemide (LASIX) 40 MG tablet TAKE 1 TABLET BY MOUTH EVERY MORNING Patient taking differently: TAKE 40 MG BY MOUTH EVERY MORNING 09/20/12   Dorena Cookey, MD  glycopyrrolate (ROBINUL) 2 MG tablet Take  1 tablet (2 mg total) by mouth 2 (two) times daily. 03/28/19   Ladene Artist, MD  HYDROcodone-acetaminophen (NORCO/VICODIN) 5-325 MG tablet Take 1-2 tablets by mouth every 4 (four) hours as needed for moderate pain. 10/25/16   Regalado, Belkys A, MD  ibuprofen (ADVIL,MOTRIN) 200 MG tablet Take 800 mg by mouth every 6 (six) hours as needed for headache or moderate pain.    [provider]  levothyroxine (SYNTHROID, LEVOTHROID) 50 MCG tablet Take 50 mcg by mouth daily before breakfast.     [provider]  losartan (COZAAR) 100 MG tablet Take 100 mg by mouth daily.    [provider]  Multiple Vitamin (MULITIVITAMIN WITH MINERALS) TABS Take 2 tablets by mouth daily.     [provider]  simvastatin (ZOCOR) 40 MG tablet Take 40 mg by mouth daily.  03/28/11   Dorena Cookey, MD  topiramate (TOPAMAX) 25 MG tablet Take 50 mg by mouth daily.     [provider]  venlafaxine XR (EFFEXOR-XR) 150 MG 24 hr capsule TAKE ONE CAPSULE BY MOUTH TWICE A DAY Patient taking differently: TAKE 300 MG BY MOUTH IN THE EVENING 06/18/12   Dorena Cookey, MD     Family History  Problem Relation Age of Onset  . Colon polyps Sister   . Colon cancer Maternal Uncle   . Colon cancer Maternal Grandfather   . Heart disease Mother   . Hypertension Mother     Social History   Socioeconomic History  . Marital status: Married    Spouse name: Not on file  . Number of children: Not on file  . Years of education: Not on file  . Highest education level: Not on file  Occupational History  . Not on file  Tobacco Use  . Smoking status: Never Smoker  . Smokeless tobacco: Never Used  Vaping Use  . Vaping Use: Never used  Substance and Sexual Activity  . Alcohol use: No  . Drug use: No  . Sexual activity: Not on file  Other Topics Concern  . Not on file  Social History Narrative  . Not on file   Social Determinants of Health   Financial Resource Strain:   . Difficulty of Paying Living Expenses:   Food Insecurity:   . Worried About Charity fundraiser in the Last Year:   . Arboriculturist in the Last Year:   Transportation Needs:   . Film/video editor (Medical):   Marland Kitchen Lack of Transportation (Non-Medical):   Physical Activity:   . Days of Exercise per Week:   . Minutes of Exercise per Session:   Stress:   . Feeling of Stress :   Social Connections:   . Frequency of Communication with Friends and Family:   . Frequency of Social Gatherings with Friends and Family:   . Attends Religious Services:   . Active Member of Clubs or Organizations:   .  Attends Archivist Meetings:   Marland Kitchen Marital Status:     ECOG Status: 0 - Asymptomatic  Review of Systems  Review of Systems: A 12 point ROS discussed and pertinent positives are indicated in the HPI above.  All other systems are negative.  Physical Exam No direct physical exam was performed (except for noted visual exam findings with Video Visits).   Vital Signs: There were no vitals taken for this visit.  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 EXAM: MRI ABDOMEN WITHOUT AND WITH CONTRAST TECHNIQUE: Multiplanar multisequence MR imaging of the abdomen was performed both before and after the administration of intravenous contrast. CONTRAST:  59mL GADAVIST GADOBUTROL 1 MMOL/ML IV SOLN COMPARISON:  10/08/2018 FINDINGS: Portions of exam are mildly motion degraded. Lower chest: Normal heart size without pericardial or pleural effusion. Hepatobiliary: Normal liver. Cholecystectomy, without biliary ductal dilatation. Pancreas:  Normal, without mass or ductal dilatation. Spleen:  Normal in size, without focal abnormality. Adrenals/Urinary Tract: Normal adrenal glands. Tiny bilateral renal lesions are most likely cysts. Off the medial aspect of the right renal sinus is the treated angiomyolipoma. On transverse imaging, this measures 5.2 x 4.5 cm on 50/16. Compare 6.2 x 5.1 cm on the prior exam. 5.9 cm craniocaudal on 37/20 today versus 5.8 cm at the same level on the prior exam. The enhancing component is less distinct today. Example at 9 x 8 mm on 42/20 versus 12 x 8 mm on the prior exam. Possibly present on transverse image 56/16 and 9 mm today versus 15 mm on the prior. No hydronephrosis or other acute complication. No perinephric or  intraparenchymal hemorrhage. No new solid renal lesion. Stomach/Bowel: Normal stomach and small bowel loops. Colonic stool burden suggests constipation. Vascular/Lymphatic: Aortic atherosclerosis. Patent renal veins. No retroperitoneal or retrocrural adenopathy. Other:  No ascites. Musculoskeletal: Discogenic edema at multiple thoracolumbar levels. IMPRESSION: 1. 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 No hemorrhage, hydronephrosis, or other acute complication. No evidence of metachronous tumor. 2.  Aortic Atherosclerosis (ICD10-I70.0). 3.  Possible constipation. Electronically Signed   By: Abigail Miyamoto M.D.   On: 08/29/2019 13:24    Labs:  CBC: No results for input(s): WBC, HGB, HCT, PLT in the last 8760 hours.  COAGS: No results for input(s): INR, APTT in the last 8760 hours.  BMP: Recent Labs    10/08/18 0728  CREATININE 1.40*    LIVER FUNCTION TESTS: No results for input(s): BILITOT, AST, ALT, ALKPHOS, PROT, ALBUMIN in the last 8760 hours.  TUMOR MARKERS: No results for input(s): AFPTM, CEA, CA199, CHROMGRNA in the last 8760 hours.  Assessment and Plan:  Sierra Banks is a 69 y.o. female with past medical history significant for hypertension, hyperlipidemia, obesity, GERD and depression who underwent a technically successful right-sided renal AML embolization on 10/24/2016 and is seen today via telemedicine consultation following acquisition of nearly 3-year post embolization surveillance renal MRI.  Patient is again without complaint.    Personal review of abdominal MRI performed earlier today demonstrates continued involution of embolized right-sided renal AML, particularly, there has been near complete resolution of previously noted tiny enhancing component.  Given imaging stability, continued dedicated follow-up is NOT recommended.  The patient demonstrated excellent understanding of the above discussion and knows to call the  interventional radiology clinic with any future questions or concerns, but may otherwise follow-up on a PRN basis  A copy of this report was sent to the requesting provider on this date.  Electronically Signed: Sandi Mariscal 09/02/2019, 9:23 AM   I spent a total of 5 Minutes in remote  clinical consultation, greater than 50% of which was counseling/coordinating care for post right-sided renal AML transcatheter embolization.    Visit type: Audio only (telephone). Audio (no video) only due to patient's lack of internet/smartphone capability. Alternative for in-person consultation at [facility], [address] [place], [place], [place]. This visit type was conducted due to national recommendations for restrictions regarding the COVID-19 Pandemic (e.g. social distancing).  This format is felt to be most appropriate for this patient at this time.  All issues noted in this document were discussed and addressed.

## 2019-09-08 DIAGNOSIS — K5900 Constipation, unspecified: Secondary | ICD-10-CM | POA: Diagnosis not present

## 2019-09-08 DIAGNOSIS — F339 Major depressive disorder, recurrent, unspecified: Secondary | ICD-10-CM | POA: Diagnosis not present

## 2019-09-19 DIAGNOSIS — Z1231 Encounter for screening mammogram for malignant neoplasm of breast: Secondary | ICD-10-CM | POA: Diagnosis not present

## 2019-09-24 DIAGNOSIS — R928 Other abnormal and inconclusive findings on diagnostic imaging of breast: Secondary | ICD-10-CM | POA: Diagnosis not present

## 2019-11-20 ENCOUNTER — Ambulatory Visit (INDEPENDENT_AMBULATORY_CARE_PROVIDER_SITE_OTHER): Payer: Medicare Other | Admitting: Gastroenterology

## 2019-11-20 ENCOUNTER — Encounter: Payer: Self-pay | Admitting: Gastroenterology

## 2019-11-20 VITALS — BP 110/70 | HR 66 | Ht 64.0 in | Wt 213.6 lb

## 2019-11-20 DIAGNOSIS — K5900 Constipation, unspecified: Secondary | ICD-10-CM | POA: Diagnosis not present

## 2019-11-20 DIAGNOSIS — R194 Change in bowel habit: Secondary | ICD-10-CM

## 2019-11-20 DIAGNOSIS — Z800 Family history of malignant neoplasm of digestive organs: Secondary | ICD-10-CM

## 2019-11-20 MED ORDER — NA SULFATE-K SULFATE-MG SULF 17.5-3.13-1.6 GM/177ML PO SOLN: 1.0000 | Freq: Once | ORAL | 0 refills | Status: AC

## 2019-11-20 NOTE — Progress Notes (Signed)
    History of Present Illness: This is a 69 year old female with a history of IBS, hepatic steatosis and a family history of colon cancer.  Her last visit was in April 2020 via telemedicine.  For the past 6 months she notes constipation occasionally alternating with diarrhea, lower abdominal pain, abdominal bloating and nausea.  She has been taking Senokot if she has not had a bowel movement for 4 to 5 days which has been effective.  Current Medications, Allergies, Past Medical History, Past Surgical History, Family History and Social History were reviewed in [employer] record.   Physical Exam: General: Well developed, well nourished, no acute distress Head: Normocephalic and atraumatic Eyes:  sclerae anicteric, EOMI Ears: Normal auditory acuity Mouth: Not examined, mask on during Covid-19 pandemic Lungs: Clear throughout to auscultation Heart: Regular rate and rhythm; no murmurs, rubs or bruits Abdomen: Soft, non tender and non distended. No masses, hepatosplenomegaly or hernias noted. Normal Bowel sounds Rectal: Deferred to colonoscopy Musculoskeletal: Symmetrical with no gross deformities  Pulses:  Normal pulses noted Extremities: No clubbing, cyanosis, edema or deformities noted Neurological: Alert oriented x 4, grossly nonfocal Psychological:  Alert and cooperative. Normal mood and affect   Assessment and Recommendations:  1.  IBS with constipation and rare episodes of diarrhea.  Change in bowel habits.  Begin MiraLAX once or twice daily titrated for complete bowel movement daily or every other day.  If the MiraLAX regimen is not effective for regular complete bowel movements she is advised to call we will consider Trulance or Linzess.  If abdominal pain and bloating persist with MiraLAX regimen working adequately then resume glycopyrrolate 2 mg p.o. twice daily as needed.  2.  Family history of colon cancer in 2 second degree relatives, MGM and M uncle.  She  is due for 5-year screening colonoscopy.  Schedule colonoscopy. The risks (including bleeding, perforation, infection, missed lesions, medication reactions and possible hospitalization or surgery if complications occur), benefits, and alternatives to colonoscopy with possible biopsy and possible polypectomy were discussed with the patient and they consent to proceed.

## 2019-11-20 NOTE — Patient Instructions (Signed)
Start over the counter Miralax mixing 17 grams in 8 oz of water 1-2 x daily.   Call back in 2 weeks if you do not see improvement in your constipation with this regimen.   You have been scheduled for a colonoscopy. Please follow written instructions given to you at your visit today.  Please pick up your prep supplies at the pharmacy within the next 1-3 days. If you use inhalers (even only as needed), please bring them with you on the day of your procedure.   Thank you for choosing me and [facility].  Pricilla Riffle. Dagoberto Ligas., MD., Marval Regal

## 2019-12-22 DIAGNOSIS — H6983 Other specified disorders of Eustachian tube, bilateral: Secondary | ICD-10-CM | POA: Diagnosis not present

## 2019-12-22 DIAGNOSIS — H9313 Tinnitus, bilateral: Secondary | ICD-10-CM | POA: Diagnosis not present

## 2019-12-22 DIAGNOSIS — H903 Sensorineural hearing loss, bilateral: Secondary | ICD-10-CM | POA: Diagnosis not present

## 2019-12-24 ENCOUNTER — Inpatient Hospital Stay (HOSPITAL_COMMUNITY)
Admission: EM | Admit: 2019-12-24 | Discharge: 2019-12-29 | DRG: 871 | Disposition: A | Discharge status: Home / Self Care | Payer: Medicare Other | Attending: Internal Medicine | Admitting: Internal Medicine

## 2019-12-24 ENCOUNTER — Emergency Department (HOSPITAL_COMMUNITY): Payer: Medicare Other

## 2019-12-24 ENCOUNTER — Inpatient Hospital Stay (HOSPITAL_COMMUNITY): Payer: Medicare Other

## 2019-12-24 ENCOUNTER — Encounter (HOSPITAL_COMMUNITY): Payer: Self-pay | Admitting: Student

## 2019-12-24 ENCOUNTER — Other Ambulatory Visit: Payer: Self-pay

## 2019-12-24 DIAGNOSIS — I110 Hypertensive heart disease with heart failure: Secondary | ICD-10-CM | POA: Diagnosis present

## 2019-12-24 DIAGNOSIS — J9601 Acute respiratory failure with hypoxia: Secondary | ICD-10-CM | POA: Diagnosis not present

## 2019-12-24 DIAGNOSIS — I10 Essential (primary) hypertension: Secondary | ICD-10-CM | POA: Diagnosis not present

## 2019-12-24 DIAGNOSIS — R6520 Severe sepsis without septic shock: Secondary | ICD-10-CM

## 2019-12-24 DIAGNOSIS — I2699 Other pulmonary embolism without acute cor pulmonale: Secondary | ICD-10-CM | POA: Diagnosis not present

## 2019-12-24 DIAGNOSIS — E039 Hypothyroidism, unspecified: Secondary | ICD-10-CM | POA: Diagnosis present

## 2019-12-24 DIAGNOSIS — Z6836 Body mass index (BMI) 36.0-36.9, adult: Secondary | ICD-10-CM

## 2019-12-24 DIAGNOSIS — Z800 Family history of malignant neoplasm of digestive organs: Secondary | ICD-10-CM | POA: Diagnosis not present

## 2019-12-24 DIAGNOSIS — J189 Pneumonia, unspecified organism: Secondary | ICD-10-CM | POA: Diagnosis present

## 2019-12-24 DIAGNOSIS — Z20822 Contact with and (suspected) exposure to covid-19: Secondary | ICD-10-CM | POA: Diagnosis present

## 2019-12-24 DIAGNOSIS — Z79899 Other long term (current) drug therapy: Secondary | ICD-10-CM

## 2019-12-24 DIAGNOSIS — R070 Pain in throat: Secondary | ICD-10-CM | POA: Diagnosis not present

## 2019-12-24 DIAGNOSIS — F32A Depression, unspecified: Secondary | ICD-10-CM | POA: Diagnosis not present

## 2019-12-24 DIAGNOSIS — Z8371 Family history of colonic polyps: Secondary | ICD-10-CM | POA: Diagnosis not present

## 2019-12-24 DIAGNOSIS — A419 Sepsis, unspecified organism: Secondary | ICD-10-CM | POA: Diagnosis present

## 2019-12-24 DIAGNOSIS — R001 Bradycardia, unspecified: Secondary | ICD-10-CM | POA: Diagnosis present

## 2019-12-24 DIAGNOSIS — E785 Hyperlipidemia, unspecified: Secondary | ICD-10-CM | POA: Diagnosis present

## 2019-12-24 DIAGNOSIS — R062 Wheezing: Secondary | ICD-10-CM | POA: Diagnosis not present

## 2019-12-24 DIAGNOSIS — R0600 Dyspnea, unspecified: Secondary | ICD-10-CM | POA: Diagnosis not present

## 2019-12-24 DIAGNOSIS — R0902 Hypoxemia: Secondary | ICD-10-CM | POA: Diagnosis not present

## 2019-12-24 DIAGNOSIS — Z96652 Presence of left artificial knee joint: Secondary | ICD-10-CM | POA: Diagnosis present

## 2019-12-24 DIAGNOSIS — J90 Pleural effusion, not elsewhere classified: Secondary | ICD-10-CM | POA: Diagnosis not present

## 2019-12-24 DIAGNOSIS — K219 Gastro-esophageal reflux disease without esophagitis: Secondary | ICD-10-CM | POA: Diagnosis present

## 2019-12-24 DIAGNOSIS — J42 Unspecified chronic bronchitis: Secondary | ICD-10-CM | POA: Diagnosis present

## 2019-12-24 DIAGNOSIS — I5032 Chronic diastolic (congestive) heart failure: Secondary | ICD-10-CM | POA: Diagnosis present

## 2019-12-24 DIAGNOSIS — Z8616 Personal history of COVID-19: Secondary | ICD-10-CM

## 2019-12-24 DIAGNOSIS — Z8249 Family history of ischemic heart disease and other diseases of the circulatory system: Secondary | ICD-10-CM | POA: Diagnosis not present

## 2019-12-24 DIAGNOSIS — R0602 Shortness of breath: Secondary | ICD-10-CM | POA: Diagnosis not present

## 2019-12-24 DIAGNOSIS — E669 Obesity, unspecified: Secondary | ICD-10-CM | POA: Diagnosis present

## 2019-12-24 DIAGNOSIS — R0689 Other abnormalities of breathing: Secondary | ICD-10-CM | POA: Diagnosis not present

## 2019-12-24 DIAGNOSIS — I5033 Acute on chronic diastolic (congestive) heart failure: Secondary | ICD-10-CM

## 2019-12-24 DIAGNOSIS — Z79890 Hormone replacement therapy (postmenopausal): Secondary | ICD-10-CM | POA: Diagnosis not present

## 2019-12-24 LAB — CBC WITH DIFFERENTIAL/PLATELET
Abs Immature Granulocytes: 0.13 10*3/uL — ABNORMAL HIGH (ref 0.00–0.07)
Basophils Absolute: 0.1 10*3/uL (ref 0.0–0.1)
Basophils Relative: 0 %
Eosinophils Absolute: 0 10*3/uL (ref 0.0–0.5)
Eosinophils Relative: 0 %
HCT: 42.2 % (ref 36.0–46.0)
Hemoglobin: 13.3 g/dL (ref 12.0–15.0)
Immature Granulocytes: 1 %
Lymphocytes Relative: 5 %
Lymphs Abs: 1.1 10*3/uL (ref 0.7–4.0)
MCH: 28.3 pg (ref 26.0–34.0)
MCHC: 31.5 g/dL (ref 30.0–36.0)
MCV: 89.8 fL (ref 80.0–100.0)
Monocytes Absolute: 1.4 10*3/uL — ABNORMAL HIGH (ref 0.1–1.0)
Monocytes Relative: 6 %
Neutro Abs: 21.6 10*3/uL — ABNORMAL HIGH (ref 1.7–7.7)
Neutrophils Relative %: 88 %
Platelets: 296 10*3/uL (ref 150–400)
RBC: 4.7 MIL/uL (ref 3.87–5.11)
RDW: 13.9 % (ref 11.5–15.5)
WBC: 24.3 10*3/uL — ABNORMAL HIGH (ref 4.0–10.5)
nRBC: 0 % (ref 0.0–0.2)

## 2019-12-24 LAB — TROPONIN I (HIGH SENSITIVITY)
Troponin I (High Sensitivity): 7 ng/L (ref <18)
Troponin I (High Sensitivity): 9 ng/L (ref <18)

## 2019-12-24 LAB — COMPREHENSIVE METABOLIC PANEL
ALT: 25 U/L (ref 0–44)
AST: 24 U/L (ref 15–41)
Albumin: 3.9 g/dL (ref 3.5–5.0)
Alkaline Phosphatase: 87 U/L (ref 38–126)
Anion gap: 13 (ref 5–15)
BUN: 13 mg/dL (ref 8–23)
CO2: 23 mmol/L (ref 22–32)
Calcium: 9.6 mg/dL (ref 8.9–10.3)
Chloride: 105 mmol/L (ref 98–111)
Creatinine, Ser: 1.22 mg/dL — ABNORMAL HIGH (ref 0.44–1.00)
GFR, Estimated: 48 mL/min — ABNORMAL LOW (ref >60)
Glucose, Bld: 159 mg/dL — ABNORMAL HIGH (ref 70–99)
Potassium: 3.4 mmol/L — ABNORMAL LOW (ref 3.5–5.1)
Sodium: 141 mmol/L (ref 135–145)
Total Bilirubin: 0.8 mg/dL (ref 0.3–1.2)
Total Protein: 7.1 g/dL (ref 6.5–8.1)

## 2019-12-24 LAB — I-STAT ARTERIAL BLOOD GAS, ED
Acid-Base Excess: 1 mmol/L (ref 0.0–2.0)
Bicarbonate: 27.6 mmol/L (ref 20.0–28.0)
Calcium, Ion: 1.29 mmol/L (ref 1.15–1.40)
HCT: 37 % (ref 36.0–46.0)
Hemoglobin: 12.6 g/dL (ref 12.0–15.0)
O2 Saturation: 90 %
Potassium: 3.8 mmol/L (ref 3.5–5.1)
Sodium: 144 mmol/L (ref 135–145)
TCO2: 29 mmol/L (ref 22–32)
pCO2 arterial: 49 mmHg — ABNORMAL HIGH (ref 32.0–48.0)
pH, Arterial: 7.358 (ref 7.350–7.450)
pO2, Arterial: 62 mmHg — ABNORMAL LOW (ref 83.0–108.0)

## 2019-12-24 LAB — ECHOCARDIOGRAM COMPLETE
Area-P 1/2: 2.6 cm2
Height: 64 in
S' Lateral: 2.87 cm
Weight: 3418.01 oz

## 2019-12-24 LAB — RESPIRATORY PANEL BY RT PCR (FLU A&B, COVID)
Influenza A by PCR: NEGATIVE
Influenza B by PCR: NEGATIVE
SARS Coronavirus 2 by RT PCR: NEGATIVE

## 2019-12-24 LAB — LACTIC ACID, PLASMA
Lactic Acid, Venous: 2.1 mmol/L (ref 0.5–1.9)
Lactic Acid, Venous: 2.1 mmol/L (ref 0.5–1.9)
Lactic Acid, Venous: 1.7 mmol/L (ref 0.5–1.9)

## 2019-12-24 LAB — MAGNESIUM: Magnesium: 1.4 mg/dL — ABNORMAL LOW (ref 1.7–2.4)

## 2019-12-24 LAB — HIV ANTIBODY (ROUTINE TESTING W REFLEX): HIV Screen 4th Generation wRfx: NONREACTIVE

## 2019-12-24 LAB — PROCALCITONIN: Procalcitonin: 0.81 ng/mL

## 2019-12-24 LAB — BRAIN NATRIURETIC PEPTIDE: B Natriuretic Peptide: 194 pg/mL — ABNORMAL HIGH (ref 0.0–100.0)

## 2019-12-24 MED ORDER — SODIUM CHLORIDE 0.9 % IV SOLN
500.0000 mg | Freq: Once | INTRAVENOUS | Status: AC
  Administered 2019-12-24: 500 mg via INTRAVENOUS
  Filled 2019-12-24: qty 500

## 2019-12-24 MED ORDER — TOPIRAMATE 25 MG PO TABS
50.0000 mg | ORAL_TABLET | Freq: Every day | ORAL | Status: DC
  Administered 2019-12-25 – 2019-12-29 (×5): 50 mg via ORAL
  Filled 2019-12-24 (×5): qty 2

## 2019-12-24 MED ORDER — ATENOLOL 25 MG PO TABS
50.0000 mg | ORAL_TABLET | Freq: Every day | ORAL | Status: DC
  Administered 2019-12-25 – 2019-12-29 (×5): 50 mg via ORAL
  Filled 2019-12-24 (×5): qty 2

## 2019-12-24 MED ORDER — BUPROPION HCL 75 MG PO TABS
150.0000 mg | ORAL_TABLET | Freq: Every day | ORAL | Status: DC
  Administered 2019-12-24 – 2019-12-28 (×5): 150 mg via ORAL
  Filled 2019-12-24 (×8): qty 2

## 2019-12-24 MED ORDER — AMLODIPINE BESYLATE 5 MG PO TABS
5.0000 mg | ORAL_TABLET | Freq: Every day | ORAL | Status: DC
  Administered 2019-12-25 – 2019-12-28 (×4): 5 mg via ORAL
  Filled 2019-12-24 (×4): qty 1

## 2019-12-24 MED ORDER — ALUM & MAG HYDROXIDE-SIMETH 200-200-20 MG/5ML PO SUSP: 30.0000 mL | Freq: Once | ORAL | Status: DC

## 2019-12-24 MED ORDER — SODIUM CHLORIDE 0.9 % IV SOLN
1.0000 g | Freq: Once | INTRAVENOUS | Status: AC
  Administered 2019-12-24: 1 g via INTRAVENOUS
  Filled 2019-12-24: qty 10

## 2019-12-24 MED ORDER — ENOXAPARIN SODIUM 40 MG/0.4ML ~~LOC~~ SOLN
40.0000 mg | Freq: Every day | SUBCUTANEOUS | Status: DC
  Administered 2019-12-24 – 2019-12-29 (×6): 40 mg via SUBCUTANEOUS
  Filled 2019-12-24 (×6): qty 0.4

## 2019-12-24 MED ORDER — FUROSEMIDE 10 MG/ML IJ SOLN
40.0000 mg | Freq: Once | INTRAMUSCULAR | Status: AC
  Administered 2019-12-24: 40 mg via INTRAVENOUS
  Filled 2019-12-24: qty 4

## 2019-12-24 MED ORDER — SODIUM CHLORIDE 0.9 % IV SOLN
2.0000 g | INTRAVENOUS | Status: DC
  Administered 2019-12-25 – 2019-12-28 (×4): 2 g via INTRAVENOUS
  Filled 2019-12-24 (×2): qty 20
  Filled 2019-12-24 (×2): qty 2

## 2019-12-24 MED ORDER — SIMVASTATIN 20 MG PO TABS
40.0000 mg | ORAL_TABLET | Freq: Every day | ORAL | Status: DC
  Administered 2019-12-25 – 2019-12-29 (×5): 40 mg via ORAL
  Filled 2019-12-24 (×5): qty 2

## 2019-12-24 MED ORDER — CLONIDINE HCL 0.2 MG PO TABS
0.2000 mg | ORAL_TABLET | Freq: Every day | ORAL | Status: DC
  Administered 2019-12-24 – 2019-12-28 (×5): 0.2 mg via ORAL
  Filled 2019-12-24 (×5): qty 1

## 2019-12-24 MED ORDER — SODIUM CHLORIDE 0.9 % IV SOLN
500.0000 mg | INTRAVENOUS | Status: DC
  Administered 2019-12-25 – 2019-12-28 (×4): 500 mg via INTRAVENOUS
  Filled 2019-12-24 (×4): qty 500

## 2019-12-24 MED ORDER — VENLAFAXINE HCL ER 75 MG PO CP24
300.0000 mg | ORAL_CAPSULE | Freq: Every day | ORAL | Status: DC
  Administered 2019-12-24 – 2019-12-28 (×5): 300 mg via ORAL
  Filled 2019-12-24 (×3): qty 4
  Filled 2019-12-24: qty 2
  Filled 2019-12-24 (×2): qty 4

## 2019-12-24 MED ORDER — LEVOTHYROXINE SODIUM 50 MCG PO TABS
50.0000 ug | ORAL_TABLET | Freq: Every day | ORAL | Status: DC
  Administered 2019-12-25 – 2019-12-29 (×5): 50 ug via ORAL
  Filled 2019-12-24 (×5): qty 1

## 2019-12-24 NOTE — Care Plan (Addendum)
[name] is a 69 year old female medical history significant for hypertension, mood disorder, hypothyroidism, HLD who presents with worsening shortness of breath, cough and fever x3 days.  Patient states she was at a local festival this past Saturday within 24 hours notice scratchy throat followed by cough and fever 100 at home and worsening shortness of breath prompting her ED evaluation.  On admission patient was found to have a T-max of 101.6 tachypneic tachycardic otherwise hemodynamically stable lab work notable for Covid test negative, BNP 194, potassium 3.4, creatinine 1.2 consistent with baseline WBC of 24.3.  Patient is admitted with diagnosis of sepsis secondary to community-acquired pneumonia.  Of note initial chest x-ray showed no active disease.  Given patient's profound respiratory distress patient required BiPAP support in addition to IV ceftriaxone and azithromycin.  This monitor my evaluation patient reports her breathing is much more improved.  She still tolerating the BiPAP without any supplemental O2.  Denies any chest pain.  Sepsis secondary to community-acquired pneumonia with acute hypoxic respiratory failure.  Unclear organism but suspect likely gram-positive/atypical.  Presented with tachypnea, tachycardia, febrile, symptoms concerning for pneumonia, SPO2 82% on room air required O2 up to 15 L before transitioning to BiPAP with improvement in shortness of breath.  Does have slightly elevated BNP and what seems to be crackles on examination could be concerning for CHF but TTE shows preserved EF with grade 1 diastolic dysfunction -Agree with continuing.  IV ceftriaxone, azithromycin -Check sputum culture -Currently not requiring any O2, but is requiring BiPAP for respiratory support -No blood cultures were obtained on admission prior to antibiotics, will repeat blood cultures if becomes febrile again She has slight lactic acidosis of 2.1 we will repeat, otherwise  hemodynamically stable -Check ABG for formal evaluation of hypoxia sure no hypercarbia -status post IV Lasix 40 mg, monitor volume status   HTN, currently at goal-continue home blood pressure medications and close monitoring giving sepsis physiology  Remaining assessment and plan as per H&P from earlier today by Dr. Eda Paschal [facility]

## 2019-12-24 NOTE — ED Triage Notes (Signed)
Pt from home by EMS; c/o respiratory distress; one episode of vomiting; to ER on NRB at 15.  Pt also reports chest pressurel

## 2019-12-24 NOTE — ED Notes (Signed)
Pt asking to be put back on bipap, pt  RR=30,

## 2019-12-24 NOTE — Progress Notes (Signed)
RT placed patient on BIPAP per patient request. Patient tolerating settings well at this time. RT will monitor as needed.

## 2019-12-24 NOTE — Plan of Care (Signed)

## 2019-12-24 NOTE — Progress Notes (Signed)
Pt removed from bipap and placed on 6L [place], SATs maintaining >90. Pt is alert and oriented. [place] provided to pt to help clear out thick, white secretions that she is coughing up. RN aware. RT will continue to monitor.

## 2019-12-24 NOTE — ED Notes (Signed)
RT placed pt back on bipap

## 2019-12-24 NOTE — ED Notes (Signed)
Per Provider, RT to assess pt, might need to be placed on Bipap.

## 2019-12-24 NOTE — ED Notes (Signed)
PA Aldona Bar made aware of pts O2 sat at 88%.

## 2019-12-24 NOTE — Progress Notes (Signed)
  Echocardiogram 2D Echocardiogram has been performed.  Sierra Banks 12/24/2019, 9:54 AM

## 2019-12-24 NOTE — Progress Notes (Signed)
Placed patient on high flow cannula via salter set at 8 LPM with Sp02=97%. Patients respiratory rate 26-32 with mild respiratory distress. Patient agreed to be place back on bipap within the next hour. Nurse is aware.

## 2019-12-24 NOTE — H&P (Signed)
History and Physical    Sierra Banks [id number] DOB: 1950-08-14 DOA: 12/24/2019  PCP: Josetta Huddle, MD  Patient coming from: Home  I have personally briefly reviewed patient's old medical records in [place]  Chief Complaint: Respiratory distress  HPI: Sierra Banks is a 69 y.o. female with medical history significant of HTN.  Pt presents to the ED in respiratory distress.  Patient has been having fevers (up to 101), chills, sore throat, cough productive of purulent sputum for past couple of days.  This had been progressively worsening but rapidly worsened this morning resulting in respiratory distress and chest tightness.  EMS called.  Pt put on CPAP, Given NTG which dropped her BP and caused her to vomit X1, CPAP stopped.  Zofran and no more nausea / vomiting.  Has had COVID vaccines.  No leg pain, swelling, recent travel, hormone use, h/o CA, h/o DVT/PE.  No hemoptysis, syncope.  Reports h/o "CHF" is on lasix, no missed doses.   ED Course: Tm 99.4, RR 30, pt put on BIPAP due to satting in the 80s on RA and even 15L NRB.  Satting low 90s on BIPAP.  WBC 24.3k with mild L shift.  BNP 194, trop 7.  COVID neg.  CXR is surprisingly clear.  BP running 094B-096G systolic.  Pt put on empiric rocephin and vanc.  Also given 40mg  IV lasix x1.   Review of Systems: As per HPI, otherwise all review of systems negative.  Past Medical History:  Diagnosis Date  . ABSCESS, BREAST, RIGHT 10/04/2009  . Allergic rhinitis   . DEPRESSION 08/28/2006  . Dysrhythmia    HEART PALPITATIONS   . GERD (gastroesophageal reflux disease)    OCC TUMS   . Hepatitis    JAUNDICE AGE 83  . 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 History of kidney stones 04/05/2016  . HYPERLIPIDEMIA 12/23/2007  . HYPERTENSION 08/28/2006  . Hypothyroidism    not as tired as she was  . Overweight(278.02) 01/12/2009    Past Surgical History:  Procedure Laterality Date  . CARPAL TUNNEL RELEASE     LEFT   2002  . CHOLECYSTECTOMY N/A 12/12/2016   Procedure: LAPAROSCOPIC CHOLECYSTECTOMY;  Surgeon: Rolm Bookbinder, MD;  Location: [facility];  Service: General;  Laterality: N/A;  . DILATION AND CURETTAGE OF UTERUS     2011  . fatty tumor removed from right arm  2016  . IR EMBO TUMOR ORGAN ISCHEMIA INFARCT INC GUIDE ROADMAPPING  10/24/2016  . IR RADIOLOGIST EVAL & MGMT  10/12/2016  . IR RADIOLOGIST EVAL & MGMT  11/16/2016  . IR RADIOLOGIST EVAL & MGMT  02/15/2017  . IR RADIOLOGIST EVAL & MGMT  08/15/2017  . IR RADIOLOGIST EVAL & MGMT  10/08/2018  . IR RADIOLOGIST EVAL & MGMT  09/02/2019  . IR RENAL SUPRASEL UNI S&I MOD SED  10/24/2016  . IR RENAL SUPRASEL UNI S&I MOD SED  10/24/2016  . IR US GUIDE VASC ACCESS RIGHT  10/24/2016  . JOINT REPLACEMENT Left   . MASS EXCISION Right 08/21/2014   Procedure: RIGHT FOREARM MASS EXCISION WITH REPAIR AND RECONSTRUCTION;  Surgeon: Roseanne Kaufman, MD;  Location: [facility];  Service: Orthopedics;  Laterality: Right;  . TONSILLECTOMY     1969  . TOTAL KNEE ARTHROPLASTY  05/08/2011   Procedure: TOTAL KNEE ARTHROPLASTY;  Surgeon: Rudean Haskell, MD;  Location: [facility];  Service: Orthopedics;  Laterality: Left;  . Hanson  reports that she has never smoked. She has never used smokeless tobacco. She reports that she does not drink alcohol and does not use drugs.  No Known Allergies  Family History  Problem Relation Age of Onset  . Colon polyps Sister   . Colon cancer Maternal Uncle   . Colon cancer Maternal Grandfather   . Heart disease Mother   . Hypertension Mother   . Esophageal cancer Neg Hx   . Pancreatic cancer Neg Hx   . Liver disease Neg Hx   . Stomach cancer Neg Hx      Prior to Admission medications   Medication Sig Start Date End Date Taking? Authorizing Provider  amLODipine (NORVASC) 5 MG tablet Take 5 mg by mouth daily.   Yes [provider]  atenolol (TENORMIN) 50 MG tablet Take 50 mg by mouth daily.     Yes [provider]  buPROPion (WELLBUTRIN) 75 MG tablet TAKE 1 TABLET AT BEDTIME Patient taking differently: Take 150 mg by mouth at bedtime.  09/20/12  Yes Dorena Cookey, MD  cloNIDine (CATAPRES) 0.1 MG tablet Take 0.2 mg by mouth at bedtime.    Yes [provider]  furosemide (LASIX) 40 MG tablet TAKE 1 TABLET BY MOUTH EVERY MORNING Patient taking differently: TAKE 40 MG BY MOUTH EVERY MORNING 09/20/12  Yes Dorena Cookey, MD  levothyroxine (SYNTHROID, LEVOTHROID) 50 MCG tablet Take 50 mcg by mouth daily before breakfast.    Yes [provider]  simvastatin (ZOCOR) 40 MG tablet Take 40 mg by mouth daily.  03/28/11  Yes Dorena Cookey, MD  topiramate (TOPAMAX) 25 MG tablet Take 50 mg by mouth daily.    Yes [provider]  venlafaxine XR (EFFEXOR-XR) 150 MG 24 hr capsule TAKE ONE CAPSULE BY MOUTH TWICE A DAY Patient taking differently: Take 300 mg by mouth at bedtime.  06/18/12  Yes Dorena Cookey, MD    Physical Exam: Vitals:   12/24/19 0400 12/24/19 0414 12/24/19 0430 12/24/19 0500  BP: 92/77 92/77 (!) 172/85 (!) 168/89  Pulse: 90  80 87  Resp: 19 (!) 26 (!) 21 (!) 26  Temp:      TempSrc:      SpO2: 94%  97% 97%  Weight:      Height:        Constitutional: NAD, calm, comfortable Eyes: PERRL, lids and conjunctivae normal ENMT: Mucous membranes are moist. Posterior pharynx clear of any exudate or lesions.Normal dentition.  Neck: normal, supple, no masses, no thyromegaly Respiratory: Coarse crackles that are audible from across the room. Cardiovascular: Regular rate and rhythm, no murmurs / rubs / gallops. No extremity edema. 2+ pedal pulses. No carotid bruits.  Abdomen: no tenderness, no masses palpated. No hepatosplenomegaly. Bowel sounds positive.  Musculoskeletal: no clubbing / cyanosis. No joint deformity upper and lower extremities. Good ROM, no contractures. Normal muscle tone.  Skin: no rashes, lesions, ulcers. No induration Neurologic: CN  2-12 grossly intact. Sensation intact, DTR normal. Strength 5/5 in all 4.  Psychiatric: Normal judgment and insight. Alert and oriented x 3. Normal mood.    Labs on Admission: I have personally reviewed following labs and imaging studies  CBC: Recent Labs  Lab 12/24/19 0332  WBC 24.3*  NEUTROABS 21.6*  HGB 13.3  HCT 42.2  MCV 89.8  PLT 528   Basic Metabolic Panel: Recent Labs  Lab 12/24/19 0332  NA 141  K 3.4*  CL 105  CO2 23  GLUCOSE 159*  BUN  13  CREATININE 1.22*  CALCIUM 9.6   GFR: Estimated Creatinine Clearance: 48.5 mL/min (A) (by C-G formula based on SCr of 1.22 mg/dL (H)). Liver Function Tests: Recent Labs  Lab 12/24/19 0332  AST 24  ALT 25  ALKPHOS 87  BILITOT 0.8  PROT 7.1  ALBUMIN 3.9   No results for input(s): LIPASE, AMYLASE in the last 168 hours. No results for input(s): AMMONIA in the last 168 hours. Coagulation Profile: No results for input(s): INR, PROTIME in the last 168 hours. Cardiac Enzymes: No results for input(s): CKTOTAL, CKMB, CKMBINDEX, TROPONINI in the last 168 hours. BNP (last 3 results) No results for input(s): PROBNP in the last 8760 hours. HbA1C: No results for input(s): HGBA1C in the last 72 hours. CBG: No results for input(s): GLUCAP in the last 168 hours. Lipid Profile: No results for input(s): CHOL, HDL, LDLCALC, TRIG, CHOLHDL, LDLDIRECT in the last 72 hours. Thyroid Function Tests: No results for input(s): TSH, T4TOTAL, FREET4, T3FREE, THYROIDAB in the last 72 hours. Anemia Panel: No results for input(s): VITAMINB12, FOLATE, FERRITIN, TIBC, IRON, RETICCTPCT in the last 72 hours. Urine analysis:    Component Value Date/Time   COLORURINE YELLOW 08/24/2013 [place] 08/24/2013 1121   LABSPEC 1.013 08/24/2013 1121   PHURINE 6.0 08/24/2013 1121   GLUCOSEU NEGATIVE 08/24/2013 1121   HGBUR NEGATIVE 08/24/2013 1121   HGBUR trace-intact 01/10/2010 0851   BILIRUBINUR NEGATIVE 08/24/2013 1121   BILIRUBINUR  n 03/20/2011 1122   KETONESUR NEGATIVE 08/24/2013 1121   PROTEINUR NEGATIVE 08/24/2013 1121   UROBILINOGEN 1.0 08/24/2013 1121   NITRITE NEGATIVE 08/24/2013 1121   LEUKOCYTESUR NEGATIVE 08/24/2013 1121    Radiological Exams on Admission: DG Chest Port 1 View  Result Date: 12/24/2019 CLINICAL DATA:  Dyspnea EXAM: PORTABLE CHEST 1 VIEW COMPARISON:  May 12, 2011 FINDINGS: The heart size is stable. Aortic calcifications are noted. There is no pneumothorax or large pleural effusion. No focal infiltrate. No acute osseous abnormality. IMPRESSION: No active disease. Electronically Signed   By: Constance Holster M.D.   On: 12/24/2019 03:56    EKG: Independently reviewed.  Assessment/Plan Principal Problem:   CAP (community acquired pneumonia) Active Problems:   Acute respiratory failure with hypoxia (HCC)   HTN (hypertension)   Severe sepsis with acute organ dysfunction ([place])    1. Acute respiratory failure with hypoxia - Requiring BIPAP 1. Most likely due to underlying CAP given fever, WBC, HPI. 2. PNA pathway 3. Rocephin + Azithro 4. Tele monitor 5. Cont pulse ox 6. BIPAP 7. CHF possible but less likely - 1. CXR neg, would expect very positive CXR given degree of respiratory failure if it were CHF causing 2. CHF wouldn't explain the leukocytosis nor fever history 3. Getting 40mg  IV lasix in ED (she takes 40mg  PO daily). 4. Getting 2D echo 8. PE felt much less likely given history and marked physical exam findings. 2. Sepsis - severe sepsis with acute respiratory failure 1. With leukocytosis and tachypnea, source is CAP 2. Given respiratory status and HTN, holding off on IVF for the moment 3. Check lactate 4. ABx as above 3. HTN - 1. Cont home BP meds  DVT prophylaxis: Lovenox Code Status: Full Family Communication: No family in room Disposition Plan: Home after breathing improved Consults called: None Admission status: Admit to inpatient  Severity of Illness: The  appropriate patient status for this patient is INPATIENT. Inpatient status is judged to be reasonable and necessary in order to provide the required intensity  of service to ensure the patient's safety. The patient's presenting symptoms, physical exam findings, and initial radiographic and laboratory data in the context of their chronic comorbidities is felt to place them at high risk for further clinical deterioration. Furthermore, it is not anticipated that the patient will be medically stable for discharge from the hospital within 2 midnights of admission. The following factors support the patient status of inpatient.   IP status due to acute respiratory failure with hypoxia, requiring rescue BIPAP.  This in setting of sepsis from likely Pneumonia.   * I certify that at the point of admission it is my clinical judgment that the patient will require inpatient hospital care spanning beyond 2 midnights from the point of admission due to high intensity of service, high risk for further deterioration and high frequency of surveillance required.*    Teigan Manner M. DO [facility]  How to contact the [facility] Attending or Consulting provider [place] or covering provider during after hours [place], for this patient?  1. Check the care team in [facility] and look for a) attending/consulting [facility] provider listed and b) the [facility] team listed 2. Log into [email or web address]  Amion Physician Scheduling and messaging for groups and whole hospitals  On call and physician scheduling software for group practices, residents, hospitalists and other medical providers for call, clinic, rotation and shift schedules. On[employer] is a hospital-wide system for scheduling doctors and paging doctors on call. EasyPlot is for scientific plotting and data analysis.  [email or web address]  and use [facility]'s universal password to access. If you do not have the password, please contact the hospital operator.  3. Locate the [facility] provider  you are looking for under [facility] and page to a number that you can be directly reached. 4. If you still have difficulty reaching the provider, please page the [facility] (Director on Call) for the Hospitalists listed on amion for assistance.  12/24/2019, 6:08 AM

## 2019-12-24 NOTE — ED Notes (Signed)
Report given to [employer].

## 2019-12-24 NOTE — ED Provider Notes (Signed)
[facility] EMERGENCY DEPARTMENT Provider Note   CSN: [id number] Arrival date & time: 12/24/19  8588     History Chief Complaint  Patient presents with  . Respiratory Distress    Sierra Banks is a 69 y.o. female with a history of hypertension and hyperlipidemia who presents to the ED via EMS with complaints of dyspnea x 3 days which acutely worsened this evening. Patient has had dyspnea, nasal congestion, low grade fevers w/ temp of 100.1, sore throat & cough productive of white phlegm sputum for the past few days, tonight her dyspnea acutely worsened with development of chest heaviness prompting 911 call. Per EMS SpO2 88% on RA, had rales, given NTG w/ CPAP applied, patient became nauseated following NTG & had episode of emesis therefore cpap was removed. Received zofran en route and is no longer nauseated.  She reports hx of CHF, has not missed any doses of Lasix. Denies diaphoresis, syncope, Denies leg pain/swelling, hemoptysis, recent surgery/trauma, recent long travel, hormone use, personal hx of cancer, or hx of DVT/PE. She is fully vaccinated against covid 19. No recent covid 19 exposures.    HPI     Past Medical History:  Diagnosis Date  . ABSCESS, BREAST, RIGHT 10/04/2009  . Allergic rhinitis   . DEPRESSION 08/28/2006  . Dysrhythmia    HEART PALPITATIONS   . GERD (gastroesophageal reflux disease)    OCC TUMS   . Hepatitis    JAUNDICE AGE 22  . 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 History of kidney stones 04/05/2016  . HYPERLIPIDEMIA 12/23/2007  . HYPERTENSION 08/28/2006  . Hypothyroidism    not as tired as she was  . Overweight(278.02) 01/12/2009    Patient Active Problem List   Diagnosis Date Noted  . Preoperative cardiovascular examination 11/23/2016  . H/O sinus bradycardia 11/23/2016  . 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 Bradycardia 10/24/2016  . Other abnormal glucose 11/22/2011  . Acute respiratory failure with hypoxia ([place])  05/11/2011  . Pedal edema 05/11/2011  . Depression 05/11/2011  . HTN (hypertension) 05/11/2011  . Skin tag 10/27/2010  . Postmenopausal bleeding 04/07/2010  . ABSCESS, BREAST, RIGHT 10/04/2009  . 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 OVERWEIGHT 01/12/2009  . Mixed hyperlipidemia 12/23/2007  . DEPRESSION 08/28/2006  . HYPERTENSION 08/28/2006    Past Surgical History:  Procedure Laterality Date  . CARPAL TUNNEL RELEASE     LEFT  2002  . CHOLECYSTECTOMY N/A 12/12/2016   Procedure: LAPAROSCOPIC CHOLECYSTECTOMY;  Surgeon: Rolm Bookbinder, MD;  Location: [facility];  Service: General;  Laterality: N/A;  . DILATION AND CURETTAGE OF UTERUS     2011  . fatty tumor removed from right arm  2016  . IR EMBO TUMOR ORGAN ISCHEMIA INFARCT INC GUIDE ROADMAPPING  10/24/2016  . IR RADIOLOGIST EVAL & MGMT  10/12/2016  . IR RADIOLOGIST EVAL & MGMT  11/16/2016  . IR RADIOLOGIST EVAL & MGMT  02/15/2017  . IR RADIOLOGIST EVAL & MGMT  08/15/2017  . IR RADIOLOGIST EVAL & MGMT  10/08/2018  . IR RADIOLOGIST EVAL & MGMT  09/02/2019  . IR RENAL SUPRASEL UNI S&I MOD SED  10/24/2016  . IR RENAL SUPRASEL UNI S&I MOD SED  10/24/2016  . IR US GUIDE VASC ACCESS RIGHT  10/24/2016  . JOINT REPLACEMENT Left   . MASS EXCISION Right 08/21/2014   Procedure: RIGHT FOREARM MASS EXCISION WITH REPAIR AND RECONSTRUCTION;  Surgeon: Roseanne Kaufman, MD;  Location: [facility];  Service: Orthopedics;  Laterality: Right;  .  TONSILLECTOMY     1969  . TOTAL KNEE ARTHROPLASTY  05/08/2011   Procedure: TOTAL KNEE ARTHROPLASTY;  Surgeon: Rudean Haskell, MD;  Location: [facility];  Service: Orthopedics;  Laterality: Left;  . TUBAL LIGATION     1980     OB History   No obstetric history on file.     Family History  Problem Relation Age of Onset  . Colon polyps Sister   . Colon cancer Maternal Uncle   . Colon cancer Maternal Grandfather   . Heart disease Mother   . Hypertension Mother   . Esophageal cancer Neg Hx   . Pancreatic cancer Neg Hx   .  Liver disease Neg Hx   . Stomach cancer Neg Hx     Social History   Tobacco Use  . Smoking status: Never Smoker  . Smokeless tobacco: Never Used  Vaping Use  . Vaping Use: Never used  Substance Use Topics  . Alcohol use: No  . Drug use: No    Home Medications Prior to Admission medications   Medication Sig Start Date End Date Taking? Authorizing Provider  atenolol (TENORMIN) 50 MG tablet Take 100 mg by mouth daily.     [provider]  buPROPion (WELLBUTRIN) 75 MG tablet TAKE 1 TABLET AT BEDTIME Patient taking differently: TAKE 75 MG BY MOUTH AT BEDTIME 09/20/12   Dorena Cookey, MD  cloNIDine (CATAPRES) 0.1 MG tablet Take 0.2 mg by mouth at bedtime.     [provider]  furosemide (LASIX) 40 MG tablet TAKE 1 TABLET BY MOUTH EVERY MORNING Patient taking differently: TAKE 40 MG BY MOUTH EVERY MORNING 09/20/12   Dorena Cookey, MD  glycopyrrolate (ROBINUL) 2 MG tablet Take 1 tablet (2 mg total) by mouth 2 (two) times daily. Patient taking differently: Take 2 mg by mouth every other day.  03/28/19   Ladene Artist, MD  levothyroxine (SYNTHROID, LEVOTHROID) 50 MCG tablet Take 50 mcg by mouth daily before breakfast.     [provider]  simvastatin (ZOCOR) 40 MG tablet Take 40 mg by mouth daily.  03/28/11   Dorena Cookey, MD  topiramate (TOPAMAX) 25 MG tablet Take 50 mg by mouth daily.     [provider]  venlafaxine XR (EFFEXOR-XR) 150 MG 24 hr capsule TAKE ONE CAPSULE BY MOUTH TWICE A DAY Patient taking differently: TAKE 300 MG BY MOUTH IN THE EVENING 06/18/12   Dorena Cookey, MD    Allergies    Patient has no known allergies.  Review of Systems   Review of Systems  Constitutional: Positive for fever.  HENT: Positive for congestion and sore throat.   Respiratory: Positive for cough and shortness of breath.   Cardiovascular: Positive for chest pain. Negative for leg swelling.  Gastrointestinal: Positive for vomiting (x 1 s/p NTG). Negative  for abdominal pain, anal bleeding, blood in stool, constipation and diarrhea.  Genitourinary: Negative for dysuria.  Neurological: Negative for syncope.  All other systems reviewed and are negative.   Physical Exam Updated Vital Signs Temp 99.4 F (37.4 C) (Axillary)   Ht 5\' 4"  (1.626 m)   Wt 96.9 kg   BMI 36.67 kg/m   Physical Exam Vitals and nursing note reviewed.  Constitutional:      General: She is not in acute distress.    Appearance: She is well-developed. She is not toxic-appearing.  HENT:     Head: Normocephalic and atraumatic.     Mouth/Throat:  Comments: Nasal congestion present.  Eyes:     General:        Right eye: No discharge.        Left eye: No discharge.     Conjunctiva/sclera: Conjunctivae normal.  Cardiovascular:     Rate and Rhythm: Normal rate and regular rhythm.  Pulmonary:     Comments: SpO2 92% on 10L NRB. Course breath sounds throughout, rales present, poor air movement.  Wet cough on exam. Abdominal:     General: There is no distension.     Palpations: Abdomen is soft.     Tenderness: There is no abdominal tenderness. There is no guarding or rebound.  Musculoskeletal:     Cervical back: Neck supple. No rigidity.     Comments: Trace symmetric edema to bilateral lower legs. No calf tenderness.   Skin:    General: Skin is warm and dry.     Findings: No rash.  Neurological:     Mental Status: She is alert.     Comments: Clear speech.   Psychiatric:        Behavior: Behavior normal.     ED Results / Procedures / Treatments   Labs (all labs ordered are listed, but only abnormal results are displayed) Labs Reviewed  COMPREHENSIVE METABOLIC PANEL - Abnormal; Notable for the following components:      Result Value   Potassium 3.4 (*)    Glucose, Bld 159 (*)    Creatinine, Ser 1.22 (*)    GFR, Estimated 48 (*)    All other components within normal limits  CBC WITH DIFFERENTIAL/PLATELET - Abnormal; Notable for the following components:    WBC 24.3 (*)    Neutro Abs 21.6 (*)    Monocytes Absolute 1.4 (*)    Abs Immature Granulocytes 0.13 (*)    All other components within normal limits  BRAIN NATRIURETIC PEPTIDE - Abnormal; Notable for the following components:   B Natriuretic Peptide 194.0 (*)    All other components within normal limits  RESPIRATORY PANEL BY RT PCR (FLU A&B, COVID)  HIV ANTIBODY (ROUTINE TESTING W REFLEX)  TROPONIN I (HIGH SENSITIVITY)  TROPONIN I (HIGH SENSITIVITY)    EKG None  Radiology DG Chest Port 1 View  Result Date: 12/24/2019 CLINICAL DATA:  Dyspnea EXAM: PORTABLE CHEST 1 VIEW COMPARISON:  May 12, 2011 FINDINGS: The heart size is stable. Aortic calcifications are noted. There is no pneumothorax or large pleural effusion. No focal infiltrate. No acute osseous abnormality. IMPRESSION: No active disease. Electronically Signed   By: Constance Holster M.D.   On: 12/24/2019 03:56    Procedures .Critical Care Performed by: Amaryllis Dyke, PA-C Authorized by: Amaryllis Dyke, PA-C    CRITICAL CARE Performed by: Kennith Maes   Total critical care time: 50 minutes  Critical care time was exclusive of separately billable procedures and treating other patients.  Critical care was necessary to treat or prevent imminent or life-threatening deterioration.  Critical care was time spent personally by me on the following activities: development of treatment plan with patient and/or surrogate as well as nursing, discussions with consultants, evaluation of patient's response to treatment, examination of patient, obtaining history from patient or surrogate, ordering and performing treatments and interventions, ordering and review of laboratory studies, ordering and review of radiographic studies, pulse oximetry and re-evaluation of patient's condition.  (including critical care time)  Medications Ordered in ED Medications  cefTRIAXone (ROCEPHIN) 1 g in sodium chloride 0.9 %  100 mL IVPB (1  g Intravenous New Bag/Given 12/24/19 0548)  azithromycin (ZITHROMAX) 500 mg in sodium chloride 0.9 % 250 mL IVPB (has no administration in time range)  buPROPion (WELLBUTRIN) tablet 150 mg (has no administration in time range)  amLODipine (NORVASC) tablet 5 mg (has no administration in time range)  atenolol (TENORMIN) tablet 50 mg (has no administration in time range)  cloNIDine (CATAPRES) tablet 0.2 mg (has no administration in time range)  levothyroxine (SYNTHROID) tablet 50 mcg (has no administration in time range)  topiramate (TOPAMAX) tablet 50 mg (has no administration in time range)  simvastatin (ZOCOR) tablet 40 mg (has no administration in time range)  venlafaxine XR (EFFEXOR-XR) 24 hr capsule 300 mg (has no administration in time range)  enoxaparin (LOVENOX) injection 40 mg (has no administration in time range)  cefTRIAXone (ROCEPHIN) 2 g in sodium chloride 0.9 % 100 mL IVPB (has no administration in time range)  azithromycin (ZITHROMAX) 500 mg in sodium chloride 0.9 % 250 mL IVPB (has no administration in time range)  cefTRIAXone (ROCEPHIN) 1 g in sodium chloride 0.9 % 100 mL IVPB (has no administration in time range)  furosemide (LASIX) injection 40 mg (40 mg Intravenous Given 12/24/19 0429)    ED Course  I have reviewed the triage vital signs and the nursing notes.  Pertinent labs & imaging results that were available during my care of the patient were reviewed by me and considered in my medical decision making (see chart for details).  Sierra Banks was evaluated in Emergency Department on 12/24/2019 for the symptoms described in the history of present illness. He/she was evaluated in the context of the global COVID-19 pandemic, which necessitated consideration that the patient might be at risk for infection with the SARS-CoV-2 virus that causes COVID-19. Institutional protocols and algorithms that pertain to the evaluation of patients at risk for COVID-19 are in  a state of rapid change based on information released by regulatory bodies including the CDC and federal and state organizations. These policies and algorithms were followed during the patient's care in the ED.    MDM Rules/Calculators/A&P                         Patient presents to the ED with complaints of dyspnea over the past few days that acutely worsened just prior to arrival.  She is nontoxic, hypoxic prior to arrival and currently has an SPO2 of 91 to 92% on 10 L nonrebreather therefore no de-escalation in supplemental oxygen at this time.  She is extremely coarse breath sounds throughout with poor air movement.  Trace peripheral edema to the bilateral lower legs.  Differential diagnosis includes: Acute CHF/pulmonary edema, pneumonia, pneumothorax, atypical ACS, pulmonary embolism.  Additional history obtained:  Additional history obtained from chart review, nursing note review, and EMS..   EKG: No STEMI Lab Tests:  I Ordered, reviewed, and interpreted labs, which included:  CBC: Significant leukocytosis at 24.3. CMP: Overall fairly similar to prior. Troponin: Within normal limits BNP: Mild elevation Covid/influenza testing: Negative  Imaging Studies ordered:  I ordered imaging studies which included chest x-ray, I independently visualized and interpreted imaging which showed no acute process.  ED Course:  04:00: RE-EVAL: SpO2 82% on NRB @ 15L, applied 6L via [place] underneath, slowly improved to 91%. Remains with course breath sounds & poor air movement throughout.  Lasix ordered.  Given her nausea with episode of vomiting was immediately following nitroglycerin and she no longer feels nauseated status post  Zofran feel is reasonable to put her back on BiPAP, she was made aware that should nausea return to alert staff immediately.  Patient is in agreement.    Patient doing well on BiPAP.  Despite her clear chest x-ray suspect that there is a degree of CHF contributing to her symptoms  as well as pneumonia from a clinical standpoint.  She has received lasix and we will start abx for CAP with her reported fevers, productive cough, and notable leukocytosis. Plan to discuss w/ hospitalist service for admission.   05:53: CONSULT: Discussed with hospitalist Dr. Alcario Drought- accepts admission.   Findings and plan of care discussed with supervising physician Dr. Dina Rich who is in agreement.    Portions of this note were generated with [employer]. Dictation errors may occur despite best attempts at proofreading.  Final Clinical Impression(s) / ED Diagnoses Final diagnoses:  Acute hypoxemic respiratory failure [facility])    Rx / DC Orders ED Discharge Orders    None       Amaryllis Dyke, PA-C 12/24/19 [id number]    Merryl Hacker, MD 12/24/19 [phone]

## 2019-12-24 NOTE — Progress Notes (Signed)
Placed on BIPAP via [facility] per order. No distress noted, coarse crackles,, fluid overload,

## 2019-12-25 ENCOUNTER — Inpatient Hospital Stay (HOSPITAL_COMMUNITY): Payer: Medicare Other

## 2019-12-25 DIAGNOSIS — E669 Obesity, unspecified: Secondary | ICD-10-CM

## 2019-12-25 LAB — COMPREHENSIVE METABOLIC PANEL
ALT: 23 U/L (ref 0–44)
AST: 27 U/L (ref 15–41)
Albumin: 3.1 g/dL — ABNORMAL LOW (ref 3.5–5.0)
Alkaline Phosphatase: 64 U/L (ref 38–126)
Anion gap: 12 (ref 5–15)
BUN: 24 mg/dL — ABNORMAL HIGH (ref 8–23)
CO2: 26 mmol/L (ref 22–32)
Calcium: 9.4 mg/dL (ref 8.9–10.3)
Chloride: 104 mmol/L (ref 98–111)
Creatinine, Ser: 1.3 mg/dL — ABNORMAL HIGH (ref 0.44–1.00)
GFR, Estimated: 44 mL/min — ABNORMAL LOW (ref >60)
Glucose, Bld: 109 mg/dL — ABNORMAL HIGH (ref 70–99)
Potassium: 3.4 mmol/L — ABNORMAL LOW (ref 3.5–5.1)
Sodium: 142 mmol/L (ref 135–145)
Total Bilirubin: 0.5 mg/dL (ref 0.3–1.2)
Total Protein: 6.1 g/dL — ABNORMAL LOW (ref 6.5–8.1)

## 2019-12-25 LAB — CBC
HCT: 36.5 % (ref 36.0–46.0)
Hemoglobin: 11.8 g/dL — ABNORMAL LOW (ref 12.0–15.0)
MCH: 29 pg (ref 26.0–34.0)
MCHC: 32.3 g/dL (ref 30.0–36.0)
MCV: 89.7 fL (ref 80.0–100.0)
Platelets: 226 10*3/uL (ref 150–400)
RBC: 4.07 MIL/uL (ref 3.87–5.11)
RDW: 14.4 % (ref 11.5–15.5)
WBC: 17.7 10*3/uL — ABNORMAL HIGH (ref 4.0–10.5)
nRBC: 0 % (ref 0.0–0.2)

## 2019-12-25 LAB — D-DIMER, QUANTITATIVE: D-Dimer, Quant: 3.31 ug/mL-FEU — ABNORMAL HIGH (ref 0.00–0.50)

## 2019-12-25 LAB — MAGNESIUM: Magnesium: 1.8 mg/dL (ref 1.7–2.4)

## 2019-12-25 MED ORDER — IOHEXOL 350 MG/ML SOLN
60.0000 mL | Freq: Once | INTRAVENOUS | Status: AC | PRN
  Administered 2019-12-25: 60 mL via INTRAVENOUS

## 2019-12-25 MED ORDER — GUAIFENESIN-DM 100-10 MG/5ML PO SYRP
5.0000 mL | ORAL_SOLUTION | ORAL | Status: DC | PRN
  Administered 2019-12-26 – 2019-12-28 (×3): 5 mL via ORAL
  Filled 2019-12-25 (×3): qty 5

## 2019-12-25 MED ORDER — ALBUTEROL SULFATE (2.5 MG/3ML) 0.083% IN NEBU
2.5000 mg | INHALATION_SOLUTION | Freq: Four times a day (QID) | RESPIRATORY_TRACT | Status: DC | PRN
  Administered 2019-12-25 – 2019-12-26 (×2): 2.5 mg via RESPIRATORY_TRACT
  Filled 2019-12-25: qty 3

## 2019-12-25 MED ORDER — FUROSEMIDE 10 MG/ML IJ SOLN
40.0000 mg | Freq: Once | INTRAMUSCULAR | Status: AC
  Administered 2019-12-25: 40 mg via INTRAVENOUS
  Filled 2019-12-25: qty 4

## 2019-12-25 MED ORDER — ACETAMINOPHEN 325 MG PO TABS
650.0000 mg | ORAL_TABLET | Freq: Four times a day (QID) | ORAL | Status: DC | PRN
  Administered 2019-12-25 – 2019-12-29 (×2): 650 mg via ORAL
  Filled 2019-12-25 (×2): qty 2

## 2019-12-25 MED ORDER — POTASSIUM CHLORIDE CRYS ER 20 MEQ PO TBCR
40.0000 meq | EXTENDED_RELEASE_TABLET | ORAL | Status: AC
  Administered 2019-12-25 (×2): 40 meq via ORAL
  Filled 2019-12-25: qty 2

## 2019-12-25 NOTE — Progress Notes (Signed)
[facility]  PROGRESS NOTE  [name] PYP:[id number] DOB: 10/03/1950 DOA: 12/24/2019 PCP: Josetta Huddle, MD Admit date - 12/24/2019   Admitting Physician Etta Quill, DO  Outpatient Primary MD for the patient is Josetta Huddle, MD  LOS - 1 Brief Narrative   [name] is a 69 year old female medical history significant for hypertension, mood disorder, hypothyroidism, HLD who presents with worsening shortness of breath, cough and fever x3 days.  Patient states [place] within 24 hours notice scratchy throat followed by cough and fever 100 at home and worsening shortness of breath prompting her ED evaluation.  On admission patient was found to have a T-max of 101.6 tachypneic tachycardic otherwise hemodynamically stable lab work notable for Covid test negative, BNP 194, potassium 3.4, creatinine 1.2 consistent with baseline WBC of 24.3.  Patient is admitted with diagnosis of sepsis secondary to community-acquired pneumonia.  Of note initial chest x-ray showed no active disease.  Given patient's profound respiratory distress patient required BiPAP support in addition to IV ceftriaxone and azithromycin.  Hospital course complicated by persistently high O2 requirements despite being on IV antibiotics and no signs of volume overload on exam or CXR. TTE shows diastolic dysfunction but preserved EF. On 6 L O2 and has no O2 requirements at home. D-dimer elevated.   Subjective  Today feels her breathing is less labored. Got off BiPAP this morning to 6 L O2 and SpO2 of 89% A & P  Sepsis secondary to community-acquired pneumonia with acute hypoxic respiratory failure.  Unclear organism but suspect likely gram-positive/atypical.  Presented with tachypnea, tachycardia, febrile, symptoms concerning for pneumonia, SPO2 82% on room air required O2 up to 15 L before transitioning to BiPAP with improvement in shortness of breath though still on 6 L to keep  SpO2 greater than 92%.  Does have slightly elevated BNPand what seems to be crackles on admission examination TTE shows some diastolic dysfunction. D-dimer elevated. Slightly tachypneic but no fever, hemodynamically stable.  -Agree with continuing.  IV ceftriaxone, azithromycin -Check sputum culture, Spna, legionella -wean o2 to maitain Spo2> 92% --CTA chest to rule pe/pleural effusion/edema -No blood cultures were obtained on admission prior to antibiotics, will repeat blood cultures if becomes febrile again --Incentive spirometry, flutter valve  CHFpEF. Elevated BNP on admission. No volume overloaded on my exam. CXR unremarkable. But still very high O2 req --check CTA --IV 40 mg Lasix x1, monitor output  HTN, currently at goal -continue home blood pressure medications and close monitoring giving sepsis physiology  HLD, stable -home statin  Mood disorder, stable - continue home wellbutrin, topamax  Family Communication  :  Husband updated at bedside  Code Status :  FULL  Disposition Plan  :  Patient is from home. Anticipated d/c date: 2 to 3 days. Barriers to d/c or necessity for inpatient status: IV lasix, CTA chest, Iv antibiotics Consults  :    Procedures  :  TTE  DVT Prophylaxis  :  Lovenox   MDM: The below labs and imaging reports were reviewed and summarized above.  Medication management as above.  Lab Results  Component Value Date   PLT 226 12/25/2019    Diet :  Diet Order            Diet regular Room service appropriate? Yes; Fluid consistency: Thin  Diet effective now                  Inpatient Medications  Scheduled Meds: . amLODipine  5 mg Oral Daily  . atenolol  50 mg Oral Daily  . buPROPion  150 mg Oral QHS  . cloNIDine  0.2 mg Oral QHS  . enoxaparin (LOVENOX) injection  40 mg Subcutaneous Daily  . levothyroxine  50 mcg Oral QAC breakfast  . simvastatin  40 mg Oral Daily  . topiramate  50 mg Oral Daily  . venlafaxine XR  300 mg Oral QHS    Continuous Infusions: . azithromycin 500 mg (12/25/19 1000)  . cefTRIAXone (ROCEPHIN)  IV 2 g (12/25/19 0900)   PRN Meds:.acetaminophen  Antibiotics  :   Anti-infectives (From admission, onward)   Start     Dose/Rate Route Frequency Ordered Stop   12/25/19 0800  cefTRIAXone (ROCEPHIN) 2 g in sodium chloride 0.9 % 100 mL IVPB        2 g 200 mL/hr over 30 Minutes Intravenous Every 24 hours 12/24/19 0557 12/30/19 0759   12/25/19 0800  azithromycin (ZITHROMAX) 500 mg in sodium chloride 0.9 % 250 mL IVPB        500 mg 250 mL/hr over 60 Minutes Intravenous Every 24 hours 12/24/19 0557 12/30/19 0759   12/24/19 0800  cefTRIAXone (ROCEPHIN) 1 g in sodium chloride 0.9 % 100 mL IVPB        1 g 200 mL/hr over 30 Minutes Intravenous  Once 12/24/19 0557 12/24/19 1101   12/24/19 0545  cefTRIAXone (ROCEPHIN) 1 g in sodium chloride 0.9 % 100 mL IVPB        1 g 200 mL/hr over 30 Minutes Intravenous  Once 12/24/19 0539 12/24/19 0636   12/24/19 0545  azithromycin (ZITHROMAX) 500 mg in sodium chloride 0.9 % 250 mL IVPB        500 mg 250 mL/hr over 60 Minutes Intravenous  Once 12/24/19 0539 12/24/19 0847       Objective   Vitals:   12/24/19 2221 12/24/19 2322 12/25/19 0344 12/25/19 1156  BP: (!) 151/90 (!) 161/66 131/66 (!) 134/56  Pulse: 94 85 77 80  Resp: (!) 24 (!) 21 19 16   Temp: 98.8 F (37.1 C) 99 F (37.2 C) 99.1 F (37.3 C) 98.6 F (37 C)  TempSrc: Axillary Oral Axillary   SpO2: 98% 96% 96% 91%  Weight:      Height:        SpO2: 91 % O2 Flow Rate (L/min): 6 L/min FiO2 (%): 50 %  Wt Readings from Last 3 Encounters:  12/24/19 96.9 kg  11/20/19 96.9 kg  06/25/18 98.4 kg    No intake or output data in the 24 hours ending 12/25/19 1502  Physical Exam:     Awake Alert, Oriented X 3, Normal affect No new F.N deficits,  [place].AT, Normal respiratory effort on 6 L, CTAB RRR,No Gallops,Rubs or new Murmurs,  +ve B.Sounds, Abd Soft, No tenderness, No rebound, guarding or  rigidity. No Cyanosis, No new Rash or bruise     I have personally reviewed the following:   Data Reviewed:  CBC Recent Labs  Lab 12/24/19 0332 12/24/19 1607 12/25/19 0134  WBC 24.3*  --  17.7*  HGB 13.3 12.6 11.8*  HCT 42.2 37.0 36.5  PLT 296  --  226  MCV 89.8  --  89.7  MCH 28.3  --  29.0  MCHC 31.5  --  32.3  RDW 13.9  --  14.4  LYMPHSABS 1.1  --   --   MONOABS 1.4*  --   --  EOSABS 0.0  --   --   BASOSABS 0.1  --   --     Chemistries  Recent Labs  Lab 12/24/19 0332 12/24/19 1129 12/24/19 1607 12/25/19 0134 12/25/19 0752  NA 141  --  144 142  --   K 3.4*  --  3.8 3.4*  --   CL 105  --   --  104  --   CO2 23  --   --  26  --   GLUCOSE 159*  --   --  109*  --   BUN 13  --   --  24*  --   CREATININE 1.22*  --   --  1.30*  --   CALCIUM 9.6  --   --  9.4  --   MG  --  1.4*  --   --  1.8  AST 24  --   --  27  --   ALT 25  --   --  23  --   ALKPHOS 87  --   --  64  --   BILITOT 0.8  --   --  0.5  --    ------------------------------------------------------------------------------------------------------------------ No results for input(s): CHOL, HDL, LDLCALC, TRIG, CHOLHDL, LDLDIRECT in the last 72 hours.  No results found for: HGBA1C ------------------------------------------------------------------------------------------------------------------ No results for input(s): TSH, T4TOTAL, T3FREE, THYROIDAB in the last 72 hours.  Invalid input(s): FREET3 ------------------------------------------------------------------------------------------------------------------ No results for input(s): VITAMINB12, FOLATE, FERRITIN, TIBC, IRON, RETICCTPCT in the last 72 hours.  Coagulation profile No results for input(s): INR, PROTIME in the last 168 hours.  Recent Labs    12/25/19 1225  DDIMER 3.31*    Cardiac Enzymes No results for input(s): CKMB, TROPONINI, MYOGLOBIN in the last 168 hours.  Invalid input(s):  CK ------------------------------------------------------------------------------------------------------------------    Component Value Date/Time   BNP 194.0 (H) 12/24/2019 4540    Micro Results Recent Results (from the past 240 hour(s))  Respiratory Panel by RT PCR (Flu A&B, Covid) - Nasopharyngeal Swab     Status: None   Collection Time: 12/24/19  3:33 AM   Specimen: Nasopharyngeal Swab  Result Value Ref Range Status   SARS Coronavirus 2 by RT PCR NEGATIVE NEGATIVE Final    Comment: (NOTE) SARS-CoV-2 target nucleic acids are NOT DETECTED.  The SARS-CoV-2 RNA is generally detectable in upper respiratoy specimens during the acute phase of infection. The lowest concentration of SARS-CoV-2 viral copies this assay can detect is 131 copies/mL. A negative result does not preclude SARS-Cov-2 infection and should not be used as the sole basis for treatment or other patient management decisions. A negative result may occur with  improper specimen collection/handling, submission of specimen other than nasopharyngeal swab, presence of viral mutation(s) within the areas targeted by this assay, and inadequate number of viral copies (<131 copies/mL). A negative result must be combined with clinical observations, patient history, and epidemiological information. The expected result is Negative.  Fact Sheet for Patients:  PinkCheek.be  Fact Sheet for Healthcare Providers:  GravelBags.it  This test is no t yet approved or cleared by the [place] FDA and  has been authorized for detection and/or diagnosis of SARS-CoV-2 by FDA under an Emergency Use Authorization (EUA). This EUA will remain  in effect (meaning this test can be used) for the duration of the COVID-19 declaration under Section 564(b)(1) of the Act, 21 U.S.C. section 360bbb-3(b)(1), unless the authorization is terminated or revoked sooner.     Influenza A by PCR  NEGATIVE NEGATIVE  Final   Influenza B by PCR NEGATIVE NEGATIVE Final    Comment: (NOTE) The Xpert Xpress SARS-CoV-2/FLU/RSV assay is intended as an aid in  the diagnosis of influenza from Nasopharyngeal swab specimens and  should not be used as a sole basis for treatment. Nasal washings and  aspirates are unacceptable for Xpert Xpress SARS-CoV-2/FLU/RSV  testing.  Fact Sheet for Patients: PinkCheek.be  Fact Sheet for Healthcare Providers: GravelBags.it  This test is not yet approved or cleared by the [place] FDA and  has been authorized for detection and/or diagnosis of SARS-CoV-2 by  FDA under an Emergency Use Authorization (EUA). This EUA will remain  in effect (meaning this test can be used) for the duration of the  Covid-19 declaration under Section 564(b)(1) of the Act, 21  U.S.C. section 360bbb-3(b)(1), unless the authorization is  terminated or revoked. Performed at [facility], [place] [address] [place], [place] [address]     Radiology Reports DG Chest Port 1 View  Result Date: 12/24/2019 CLINICAL DATA:  Dyspnea EXAM: PORTABLE CHEST 1 VIEW COMPARISON:  May 12, 2011 FINDINGS: The heart size is stable. Aortic calcifications are noted. There is no pneumothorax or large pleural effusion. No focal infiltrate. No acute osseous abnormality. IMPRESSION: No active disease. Electronically Signed   By: Constance Holster M.D.   On: 12/24/2019 03:56   ECHOCARDIOGRAM COMPLETE  Result Date: 12/24/2019    ECHOCARDIOGRAM REPORT   Patient Name:   Sierra Banks Date of Exam: 12/24/2019 Medical Rec #:  [id number]        Height:       64.0 in Accession #:    [id number]       Weight:       213.6 lb Date of Birth:  May 27, 1950        BSA:          2.012 m Patient Age:    85 years         BP:           143/81 mmHg Patient Gender: F                HR:           86 bpm. Exam Location:  Inpatient Procedure: 2D Echo, Cardiac  Doppler and Color Doppler Indications:    I50.33 Acute on chronic diastolic (congestive) heart failure  History:        Patient has prior history of Echocardiogram examinations, most                 recent 05/11/2011. Risk Factors:Hypertension and Dyslipidemia.                 Hypothyroidism. GERD.  Sonographer:    Jonelle Sidle Dance Referring Phys: [place]  1. Left ventricular ejection fraction, by estimation, is 60 to 65%. The left ventricle has normal function. The left ventricle has no regional wall motion abnormalities. Left ventricular diastolic parameters are consistent with Grade I diastolic dysfunction (impaired relaxation).  2. Right ventricular systolic function is normal. The right ventricular size is normal.  3. The mitral valve is normal in structure. No evidence of mitral valve regurgitation. No evidence of mitral stenosis.  4. The aortic valve is normal in structure. Aortic valve regurgitation is not visualized. No aortic stenosis is present.  5. The inferior vena cava is normal in size with greater than 50% respiratory variability, suggesting right atrial pressure of 3 mmHg. FINDINGS  Left Ventricle:  Left ventricular ejection fraction, by estimation, is 60 to 65%. The left ventricle has normal function. The left ventricle has no regional wall motion abnormalities. The left ventricular internal cavity size was normal in size. There is  no left ventricular hypertrophy. Left ventricular diastolic parameters are consistent with Grade I diastolic dysfunction (impaired relaxation). Right Ventricle: The right ventricular size is normal. No increase in right ventricular wall thickness. Right ventricular systolic function is normal. Left Atrium: Left atrial size was normal in size. Right Atrium: Right atrial size was normal in size. Pericardium: There is no evidence of pericardial effusion. Mitral Valve: The mitral valve is normal in structure. No evidence of mitral valve regurgitation. No  evidence of mitral valve stenosis. Tricuspid Valve: The tricuspid valve is normal in structure. Tricuspid valve regurgitation is not demonstrated. No evidence of tricuspid stenosis. Aortic Valve: The aortic valve is normal in structure. Aortic valve regurgitation is not visualized. No aortic stenosis is present. Pulmonic Valve: The pulmonic valve was normal in structure. Pulmonic valve regurgitation is not visualized. No evidence of pulmonic stenosis. Aorta: The aortic root is normal in size and structure. Venous: The inferior vena cava is normal in size with greater than 50% respiratory variability, suggesting right atrial pressure of 3 mmHg. IAS/Shunts: No atrial level shunt detected by color flow Doppler.  LEFT VENTRICLE PLAX 2D LVIDd:         4.10 cm  Diastology LVIDs:         2.87 cm  LV e' medial:    6.64 cm/s LV PW:         1.30 cm  LV E/e' medial:  11.9 LV IVS:        1.09 cm  LV e' lateral:   6.20 cm/s LVOT diam:     2.10 cm  LV E/e' lateral: 12.7 LV SV:         80 LV SV Index:   40 LVOT Area:     3.46 cm  RIGHT VENTRICLE             IVC RV Basal diam:  2.20 cm     IVC diam: 1.78 cm RV S prime:     10.00 cm/s TAPSE (M-mode): 1.7 cm LEFT ATRIUM             Index       RIGHT ATRIUM          Index LA diam:        4.40 cm 2.19 cm/m  RA Area:     9.76 cm LA Vol (A2C):   39.3 ml 19.53 ml/m RA Volume:   16.00 ml 7.95 ml/m LA Vol (A4C):   46.0 ml 22.86 ml/m LA Biplane Vol: 44.4 ml 22.07 ml/m  AORTIC VALVE LVOT Vmax:   98.30 cm/s LVOT Vmean:  70.800 cm/s LVOT VTI:    0.231 m  AORTA Ao Root diam: 3.10 cm Ao Asc diam:  3.30 cm MITRAL VALVE MV Area (PHT): 2.60 cm    SHUNTS MV Decel Time: 292 msec    Systemic VTI:  0.23 m MV E velocity: 78.80 cm/s  Systemic Diam: 2.10 cm MV A velocity: 97.90 cm/s MV E/A ratio:  0.80 Candee Furbish MD Electronically signed by Candee Furbish MD Signature Date/Time: 12/24/2019/11:02:27 AM    Final      Time Spent in minutes  30     Desiree Hane M.D on 12/25/2019 at 3:02  PM  To page go to [email or web address] - password [facility]

## 2019-12-25 NOTE — Plan of Care (Signed)

## 2019-12-26 DIAGNOSIS — F32A Depression, unspecified: Secondary | ICD-10-CM

## 2019-12-26 LAB — BASIC METABOLIC PANEL
Anion gap: 10 (ref 5–15)
BUN: 21 mg/dL (ref 8–23)
CO2: 25 mmol/L (ref 22–32)
Calcium: 9.3 mg/dL (ref 8.9–10.3)
Chloride: 104 mmol/L (ref 98–111)
Creatinine, Ser: 1.05 mg/dL — ABNORMAL HIGH (ref 0.44–1.00)
GFR, Estimated: 57 mL/min — ABNORMAL LOW (ref >60)
Glucose, Bld: 144 mg/dL — ABNORMAL HIGH (ref 70–99)
Potassium: 3.2 mmol/L — ABNORMAL LOW (ref 3.5–5.1)
Sodium: 139 mmol/L (ref 135–145)

## 2019-12-26 LAB — CBC WITH DIFFERENTIAL/PLATELET
Abs Immature Granulocytes: 0.03 10*3/uL (ref 0.00–0.07)
Basophils Absolute: 0 10*3/uL (ref 0.0–0.1)
Basophils Relative: 0 %
Eosinophils Absolute: 0.4 10*3/uL (ref 0.0–0.5)
Eosinophils Relative: 4 %
HCT: 36.4 % (ref 36.0–46.0)
Hemoglobin: 11.4 g/dL — ABNORMAL LOW (ref 12.0–15.0)
Immature Granulocytes: 0 %
Lymphocytes Relative: 14 %
Lymphs Abs: 1.5 10*3/uL (ref 0.7–4.0)
MCH: 28.5 pg (ref 26.0–34.0)
MCHC: 31.3 g/dL (ref 30.0–36.0)
MCV: 91 fL (ref 80.0–100.0)
Monocytes Absolute: 0.6 10*3/uL (ref 0.1–1.0)
Monocytes Relative: 6 %
Neutro Abs: 7.9 10*3/uL — ABNORMAL HIGH (ref 1.7–7.7)
Neutrophils Relative %: 76 %
Platelets: 204 10*3/uL (ref 150–400)
RBC: 4 MIL/uL (ref 3.87–5.11)
RDW: 14.4 % (ref 11.5–15.5)
WBC: 10.4 10*3/uL (ref 4.0–10.5)
nRBC: 0 % (ref 0.0–0.2)

## 2019-12-26 LAB — MAGNESIUM: Magnesium: 1.9 mg/dL (ref 1.7–2.4)

## 2019-12-26 MED ORDER — FUROSEMIDE 40 MG PO TABS
40.0000 mg | ORAL_TABLET | Freq: Every morning | ORAL | Status: DC
  Administered 2019-12-26 – 2019-12-29 (×4): 40 mg via ORAL
  Filled 2019-12-26 (×4): qty 1

## 2019-12-26 MED ORDER — GUAIFENESIN ER 600 MG PO TB12
600.0000 mg | ORAL_TABLET | Freq: Two times a day (BID) | ORAL | Status: DC
  Administered 2019-12-26 – 2019-12-29 (×7): 600 mg via ORAL
  Filled 2019-12-26 (×7): qty 1

## 2019-12-26 MED ORDER — POTASSIUM CHLORIDE CRYS ER 20 MEQ PO TBCR
40.0000 meq | EXTENDED_RELEASE_TABLET | ORAL | Status: AC
  Administered 2019-12-26 (×2): 40 meq via ORAL
  Filled 2019-12-26: qty 2

## 2019-12-26 MED ORDER — IPRATROPIUM-ALBUTEROL 0.5-2.5 (3) MG/3ML IN SOLN
3.0000 mL | Freq: Four times a day (QID) | RESPIRATORY_TRACT | Status: DC
  Administered 2019-12-26 – 2019-12-28 (×8): 3 mL via RESPIRATORY_TRACT
  Filled 2019-12-26 (×9): qty 3

## 2019-12-26 NOTE — Care Management Important Message (Signed)
Important Message  Patient Details  Name: Sierra Banks MRN: [id number] Date of Birth: 01/14/1951   Medicare Important Message Given:  Yes     [name] [address] 12/26/2019, 3:18 PM

## 2019-12-26 NOTE — Progress Notes (Signed)
[facility]  PROGRESS NOTE  RAKEISHA NYCE ASN:[id number] DOB: 07/05/1950 DOA: 12/24/2019 PCP: Josetta Huddle, MD Admit date - 12/24/2019   Admitting Physician Etta Quill, DO  Outpatient Primary MD for the patient is Josetta Huddle, MD  LOS - 2 Brief Narrative   Ms. Gavyn Zoss is a 69 year old female medical history significant for hypertension, mood disorder, hypothyroidism, HLD who presents with worsening shortness of breath, cough and fever x3 days.  Patient states she was [place] local festival this past [place] within 24 hours notice scratchy throat followed by cough and fever 100 at home and worsening shortness of breath prompting her ED evaluation.  On admission patient was found to have a T-max of 101.6 tachypneic tachycardic otherwise hemodynamically stable lab work notable for Covid test negative, BNP 194, potassium 3.4, creatinine 1.2 consistent with baseline WBC of 24.3.  Patient is admitted with diagnosis of sepsis secondary to community-acquired pneumonia.  Of note initial chest x-ray showed no active disease.  Given patient's profound respiratory distress patient required BiPAP support in addition to IV ceftriaxone and azithromycin.  Hospital course complicated by persistently high O2 requirements despite being on IV antibiotics and no signs of volume overload on exam or CXR. TTE shows diastolic dysfunction but preserved EF. On 6 L O2 and has no O2 requirements at home. D-dimer elevated but CTA negative for PE.    Subjective  Overnight had increased coughing and wheezing that improved with supportive care with antitussives and albuterol nebulizer as needed.  States she feels like her wheezing is worse today.  Has headache when she coughs A & P  Sepsis secondary to community-acquired pneumonia with acute hypoxic respiratory failure.  Unclear organism but suspect likely gram-positive/atypical.    Still requiring 6 L to maintain SPO2 greater than 92% (down from 15 L on  admission), still has significant wheezing on exam.  CTA shows groundglass opacities consistent with multifocal infection.  Suspect has some baseline lung dysfunction given prior Covid infection (reported in March 2021), Covid test negative this admission.   - IV ceftriaxone, azithromycin -Check sputum culture, Spna, legionella -wean o2 to maitain Spo2> 92% -Addscheduled duo nebs --CTA chest to rule pe/pleural effusion/edema -No blood cultures were obtained on admission prior to antibiotics, will repeat blood cultures if becomes febrile again --Incentive spirometry, flutter valve  CHFpEF. Elevated BNP on admission. No volume overloaded on my exam. CXR unremarkable.  No pulmonary edema on CTA.  Preserved EF on TTE with hospital stay.  Euvolemic on exam --Resume home Lasix -Monitor output  HTN, currently at goal -continue home blood pressure medications and close monitoring  HLD, stable -home statin  Mood disorder, stable - continue home wellbutrin, topamax  Family Communication  :  Husband updated at bedside on 11/11  Code Status :  FULL  Disposition Plan  :  Patient is from home. Anticipated d/c date: 2 to 3 days. Barriers to d/c or necessity for inpatient status: , Iv antibiotics, still requiring high amount of supplemental oxygen to maintain normal oxygen saturation, no new scheduled duo nebs consults  : None  Procedures  :  TTE  DVT Prophylaxis  :  Lovenox   MDM: The below labs and imaging reports were reviewed and summarized above.  Medication management as above.  Lab Results  Component Value Date   PLT 204 12/26/2019    Diet :  Diet Order            Diet regular Room service appropriate? Yes; Fluid  consistency: Thin  Diet effective now                  Inpatient Medications Scheduled Meds: . amLODipine  5 mg Oral Daily  . atenolol  50 mg Oral Daily  . buPROPion  150 mg Oral QHS  . cloNIDine  0.2 mg Oral QHS  . enoxaparin (LOVENOX) injection  40 mg  Subcutaneous Daily  . guaiFENesin  600 mg Oral BID  . ipratropium-albuterol  3 mL Nebulization Q6H  . levothyroxine  50 mcg Oral QAC breakfast  . simvastatin  40 mg Oral Daily  . topiramate  50 mg Oral Daily  . venlafaxine XR  300 mg Oral QHS   Continuous Infusions: . azithromycin 500 mg (12/26/19 0900)  . cefTRIAXone (ROCEPHIN)  IV 2 g (12/26/19 1105)   PRN Meds:.acetaminophen, albuterol, guaiFENesin-dextromethorphan  Antibiotics  :   Anti-infectives (From admission, onward)   Start     Dose/Rate Route Frequency Ordered Stop   12/25/19 0800  cefTRIAXone (ROCEPHIN) 2 g in sodium chloride 0.9 % 100 mL IVPB        2 g 200 mL/hr over 30 Minutes Intravenous Every 24 hours 12/24/19 0557 12/30/19 0759   12/25/19 0800  azithromycin (ZITHROMAX) 500 mg in sodium chloride 0.9 % 250 mL IVPB        500 mg 250 mL/hr over 60 Minutes Intravenous Every 24 hours 12/24/19 0557 12/30/19 0759   12/24/19 0800  cefTRIAXone (ROCEPHIN) 1 g in sodium chloride 0.9 % 100 mL IVPB        1 g 200 mL/hr over 30 Minutes Intravenous  Once 12/24/19 0557 12/24/19 1101   12/24/19 0545  cefTRIAXone (ROCEPHIN) 1 g in sodium chloride 0.9 % 100 mL IVPB        1 g 200 mL/hr over 30 Minutes Intravenous  Once 12/24/19 0539 12/24/19 0636   12/24/19 0545  azithromycin (ZITHROMAX) 500 mg in sodium chloride 0.9 % 250 mL IVPB        500 mg 250 mL/hr over 60 Minutes Intravenous  Once 12/24/19 0539 12/24/19 0847       Objective   Vitals:   12/25/19 2343 12/26/19 0509 12/26/19 0938 12/26/19 1056  BP:  (!) 129/59    Pulse: 67 64 61 72  Resp: 17 17 19    Temp:  98.4 F (36.9 C)    TempSrc:  Oral    SpO2: 99% 98% 97%   Weight:      Height:        SpO2: 97 % O2 Flow Rate (L/min): 3 L/min FiO2 (%): 50 %  Wt Readings from Last 3 Encounters:  12/24/19 96.9 kg  11/20/19 96.9 kg  06/25/18 98.4 kg     Intake/Output Summary (Last 24 hours) at 12/26/2019 1205 Last data filed at 12/26/2019 0402 Gross per 24 hour   Intake 250 ml  Output -  Net 250 ml    Physical Exam:     Awake Alert, Oriented X 3, Normal affect No new F.N deficits,  [place].AT, Normal respiratory effort on 6 L, audible wheezing, increased wheezing throughout all lung fields RRR,No Gallops,Rubs or new Murmurs,  +ve B.Sounds, Abd Soft, No tenderness, No rebound, guarding or rigidity. No Cyanosis, No new Rash or bruise     I have personally reviewed the following:   Data Reviewed:  CBC Recent Labs  Lab 12/24/19 0332 12/24/19 1607 12/25/19 0134 12/26/19 0155  WBC 24.3*  --  17.7* 10.4  HGB 13.3 12.6  11.8* 11.4*  HCT 42.2 37.0 36.5 36.4  PLT 296  --  226 204  MCV 89.8  --  89.7 91.0  MCH 28.3  --  29.0 28.5  MCHC 31.5  --  32.3 31.3  RDW 13.9  --  14.4 14.4  LYMPHSABS 1.1  --   --  1.5  MONOABS 1.4*  --   --  0.6  EOSABS 0.0  --   --  0.4  BASOSABS 0.1  --   --  0.0    Chemistries  Recent Labs  Lab 12/24/19 0332 12/24/19 1129 12/24/19 1607 12/25/19 0134 12/25/19 0752 12/26/19 0155  NA 141  --  144 142  --  139  K 3.4*  --  3.8 3.4*  --  3.2*  CL 105  --   --  104  --  104  CO2 23  --   --  26  --  25  GLUCOSE 159*  --   --  109*  --  144*  BUN 13  --   --  24*  --  21  CREATININE 1.22*  --   --  1.30*  --  1.05*  CALCIUM 9.6  --   --  9.4  --  9.3  MG  --  1.4*  --   --  1.8 1.9  AST 24  --   --  27  --   --   ALT 25  --   --  23  --   --   ALKPHOS 87  --   --  64  --   --   BILITOT 0.8  --   --  0.5  --   --    ------------------------------------------------------------------------------------------------------------------ No results for input(s): CHOL, HDL, LDLCALC, TRIG, CHOLHDL, LDLDIRECT in the last 72 hours.  No results found for: HGBA1C ------------------------------------------------------------------------------------------------------------------ No results for input(s): TSH, T4TOTAL, T3FREE, THYROIDAB in the last 72 hours.  Invalid input(s):  FREET3 ------------------------------------------------------------------------------------------------------------------ No results for input(s): VITAMINB12, FOLATE, FERRITIN, TIBC, IRON, RETICCTPCT in the last 72 hours.  Coagulation profile No results for input(s): INR, PROTIME in the last 168 hours.  Recent Labs    12/25/19 1225  DDIMER 3.31*    Cardiac Enzymes No results for input(s): CKMB, TROPONINI, MYOGLOBIN in the last 168 hours.  Invalid input(s): CK ------------------------------------------------------------------------------------------------------------------    Component Value Date/Time   BNP 194.0 (H) 12/24/2019 3536    Micro Results Recent Results (from the past 240 hour(s))  Respiratory Panel by RT PCR (Flu A&B, Covid) - Nasopharyngeal Swab     Status: None   Collection Time: 12/24/19  3:33 AM   Specimen: Nasopharyngeal Swab  Result Value Ref Range Status   SARS Coronavirus 2 by RT PCR NEGATIVE NEGATIVE Final    Comment: (NOTE) SARS-CoV-2 target nucleic acids are NOT DETECTED.  The SARS-CoV-2 RNA is generally detectable in upper respiratoy specimens during the acute phase of infection. The lowest concentration of SARS-CoV-2 viral copies this assay can detect is 131 copies/mL. A negative result does not preclude SARS-Cov-2 infection and should not be used as the sole basis for treatment or other patient management decisions. A negative result may occur with  improper specimen collection/handling, submission of specimen other than nasopharyngeal swab, presence of viral mutation(s) within the areas targeted by this assay, and inadequate number of viral copies (<131 copies/mL). A negative result must be combined with clinical observations, patient history, and epidemiological information. The expected result is Negative.  Fact Sheet for  Patients:  PinkCheek.be  Fact Sheet for Healthcare Providers:   GravelBags.it  This test is no t yet approved or cleared by the [place] FDA and  has been authorized for detection and/or diagnosis of SARS-CoV-2 by FDA under an Emergency Use Authorization (EUA). This EUA will remain  in effect (meaning this test can be used) for the duration of the COVID-19 declaration under Section 564(b)(1) of the Act, 21 U.S.C. section 360bbb-3(b)(1), unless the authorization is terminated or revoked sooner.     Influenza A by PCR NEGATIVE NEGATIVE Final   Influenza B by PCR NEGATIVE NEGATIVE Final    Comment: (NOTE) The Xpert Xpress SARS-CoV-2/FLU/RSV assay is intended as an aid in  the diagnosis of influenza from Nasopharyngeal swab specimens and  should not be used as a sole basis for treatment. Nasal washings and  aspirates are unacceptable for Xpert Xpress SARS-CoV-2/FLU/RSV  testing.  Fact Sheet for Patients: PinkCheek.be  Fact Sheet for Healthcare Providers: GravelBags.it  This test is not yet approved or cleared by the [place] FDA and  has been authorized for detection and/or diagnosis of SARS-CoV-2 by  FDA under an Emergency Use Authorization (EUA). This EUA will remain  in effect (meaning this test can be used) for the duration of the  Covid-19 declaration under Section 564(b)(1) of the Act, 21  U.S.C. section 360bbb-3(b)(1), unless the authorization is  terminated or revoked. Performed at [facility], [place] [address] [place], [place] [address]     Radiology Reports CT ANGIO CHEST PE W OR WO CONTRAST  Result Date: 12/25/2019 CLINICAL DATA:  PE suspected EXAM: CT ANGIOGRAPHY CHEST WITH CONTRAST TECHNIQUE: Multidetector CT imaging of the chest was performed using the standard protocol during bolus administration of intravenous contrast. Multiplanar CT image reconstructions and MIPs were obtained to evaluate the vascular anatomy. CONTRAST:   52mL OMNIPAQUE IOHEXOL 350 MG/ML SOLN COMPARISON:  None. FINDINGS: Cardiovascular: Satisfactory opacification of the pulmonary arteries to the segmental level. No evidence of pulmonary embolism. Normal heart size. No pericardial effusion. Mediastinum/Nodes: No enlarged mediastinal, hilar, or axillary lymph nodes. Frothy debris in the lower trachea. Thyroid gland and esophagus demonstrate no significant findings. Lungs/Pleura: There is scattered bilateral heterogeneous and ground-glass airspace opacity throughout the lungs, with a more dense, bandlike consolidation or atelectasis of the left lung base (series 7, image 63). No pleural effusion or pneumothorax. Upper Abdomen: No acute abnormality. Musculoskeletal: No chest wall abnormality. No acute or significant osseous findings. Review of the MIP images confirms the above findings. IMPRESSION: 1. Negative examination for pulmonary embolism. 2. Scattered bilateral heterogeneous and ground-glass airspace opacity throughout the lungs, with a more dense, bandlike consolidation or atelectasis of the left lung base. Findings are consistent with multifocal infection, including COVID-19 if clinically suspected. Electronically Signed   By: Eddie Candle M.D.   On: 12/25/2019 18:10   DG Chest Port 1 View  Result Date: 12/24/2019 CLINICAL DATA:  Dyspnea EXAM: PORTABLE CHEST 1 VIEW COMPARISON:  May 12, 2011 FINDINGS: The heart size is stable. Aortic calcifications are noted. There is no pneumothorax or large pleural effusion. No focal infiltrate. No acute osseous abnormality. IMPRESSION: No active disease. Electronically Signed   By: Constance Holster M.D.   On: 12/24/2019 03:56   ECHOCARDIOGRAM COMPLETE  Result Date: 12/24/2019    ECHOCARDIOGRAM REPORT   Patient Name:   Sierra Banks Date of Exam: 12/24/2019 Medical Rec #:  [id number]        Height:  64.0 in Accession #:    [id number]       Weight:       213.6 lb Date of Birth:  09/21/1950        BSA:           2.012 m Patient Age:    83 years         BP:           143/81 mmHg Patient Gender: F                HR:           86 bpm. Exam Location:  Inpatient Procedure: 2D Echo, Cardiac Doppler and Color Doppler Indications:    I50.33 Acute on chronic diastolic (congestive) heart failure  History:        Patient has prior history of Echocardiogram examinations, most                 recent 05/11/2011. Risk Factors:Hypertension and Dyslipidemia.                 Hypothyroidism. GERD.  Sonographer:    Jonelle Sidle Dance Referring Phys: [place]  1. Left ventricular ejection fraction, by estimation, is 60 to 65%. The left ventricle has normal function. The left ventricle has no regional wall motion abnormalities. Left ventricular diastolic parameters are consistent with Grade I diastolic dysfunction (impaired relaxation).  2. Right ventricular systolic function is normal. The right ventricular size is normal.  3. The mitral valve is normal in structure. No evidence of mitral valve regurgitation. No evidence of mitral stenosis.  4. The aortic valve is normal in structure. Aortic valve regurgitation is not visualized. No aortic stenosis is present.  5. The inferior vena cava is normal in size with greater than 50% respiratory variability, suggesting right atrial pressure of 3 mmHg. FINDINGS  Left Ventricle: Left ventricular ejection fraction, by estimation, is 60 to 65%. The left ventricle has normal function. The left ventricle has no regional wall motion abnormalities. The left ventricular internal cavity size was normal in size. There is  no left ventricular hypertrophy. Left ventricular diastolic parameters are consistent with Grade I diastolic dysfunction (impaired relaxation). Right Ventricle: The right ventricular size is normal. No increase in right ventricular wall thickness. Right ventricular systolic function is normal. Left Atrium: Left atrial size was normal in size. Right Atrium: Right atrial size  was normal in size. Pericardium: There is no evidence of pericardial effusion. Mitral Valve: The mitral valve is normal in structure. No evidence of mitral valve regurgitation. No evidence of mitral valve stenosis. Tricuspid Valve: The tricuspid valve is normal in structure. Tricuspid valve regurgitation is not demonstrated. No evidence of tricuspid stenosis. Aortic Valve: The aortic valve is normal in structure. Aortic valve regurgitation is not visualized. No aortic stenosis is present. Pulmonic Valve: The pulmonic valve was normal in structure. Pulmonic valve regurgitation is not visualized. No evidence of pulmonic stenosis. Aorta: The aortic root is normal in size and structure. Venous: The inferior vena cava is normal in size with greater than 50% respiratory variability, suggesting right atrial pressure of 3 mmHg. IAS/Shunts: No atrial level shunt detected by color flow Doppler.  LEFT VENTRICLE PLAX 2D LVIDd:         4.10 cm  Diastology LVIDs:         2.87 cm  LV e' medial:    6.64 cm/s LV PW:         1.30 cm  LV E/e' medial:  11.9 LV IVS:        1.09 cm  LV e' lateral:   6.20 cm/s LVOT diam:     2.10 cm  LV E/e' lateral: 12.7 LV SV:         80 LV SV Index:   40 LVOT Area:     3.46 cm  RIGHT VENTRICLE             IVC RV Basal diam:  2.20 cm     IVC diam: 1.78 cm RV S prime:     10.00 cm/s TAPSE (M-mode): 1.7 cm LEFT ATRIUM             Index       RIGHT ATRIUM          Index LA diam:        4.40 cm 2.19 cm/m  RA Area:     9.76 cm LA Vol (A2C):   39.3 ml 19.53 ml/m RA Volume:   16.00 ml 7.95 ml/m LA Vol (A4C):   46.0 ml 22.86 ml/m LA Biplane Vol: 44.4 ml 22.07 ml/m  AORTIC VALVE LVOT Vmax:   98.30 cm/s LVOT Vmean:  70.800 cm/s LVOT VTI:    0.231 m  AORTA Ao Root diam: 3.10 cm Ao Asc diam:  3.30 cm MITRAL VALVE MV Area (PHT): 2.60 cm    SHUNTS MV Decel Time: 292 msec    Systemic VTI:  0.23 m MV E velocity: 78.80 cm/s  Systemic Diam: 2.10 cm MV A velocity: 97.90 cm/s MV E/A ratio:  0.80 Candee Furbish MD  Electronically signed by Candee Furbish MD Signature Date/Time: 12/24/2019/11:02:27 AM    Final      Time Spent in minutes  30     Desiree Hane M.D on 12/26/2019 at 12:05 PM  To page go to [email or web address] - password [facility]

## 2019-12-26 NOTE — Progress Notes (Signed)
Pt was noted coughing and wheezing at the beginning of the shift. On call MD was notified, new PRN orders for breathing tx and cough syrup in mar.

## 2019-12-26 NOTE — Plan of Care (Signed)
Weaning O2 as ordered. Denies SOB or discomfort at this time. Safety precautions maintained.

## 2019-12-27 LAB — BASIC METABOLIC PANEL
Anion gap: 5 (ref 5–15)
BUN: 15 mg/dL (ref 8–23)
CO2: 26 mmol/L (ref 22–32)
Calcium: 9.3 mg/dL (ref 8.9–10.3)
Chloride: 108 mmol/L (ref 98–111)
Creatinine, Ser: 1.05 mg/dL — ABNORMAL HIGH (ref 0.44–1.00)
GFR, Estimated: 57 mL/min — ABNORMAL LOW (ref >60)
Glucose, Bld: 91 mg/dL (ref 70–99)
Potassium: 3.6 mmol/L (ref 3.5–5.1)
Sodium: 139 mmol/L (ref 135–145)

## 2019-12-27 LAB — CBC WITH DIFFERENTIAL/PLATELET
Abs Immature Granulocytes: 0.04 10*3/uL (ref 0.00–0.07)
Basophils Absolute: 0 10*3/uL (ref 0.0–0.1)
Basophils Relative: 0 %
Eosinophils Absolute: 0.3 10*3/uL (ref 0.0–0.5)
Eosinophils Relative: 3 %
HCT: 35.4 % — ABNORMAL LOW (ref 36.0–46.0)
Hemoglobin: 11.2 g/dL — ABNORMAL LOW (ref 12.0–15.0)
Immature Granulocytes: 1 %
Lymphocytes Relative: 25 %
Lymphs Abs: 2.1 10*3/uL (ref 0.7–4.0)
MCH: 28.8 pg (ref 26.0–34.0)
MCHC: 31.6 g/dL (ref 30.0–36.0)
MCV: 91 fL (ref 80.0–100.0)
Monocytes Absolute: 0.7 10*3/uL (ref 0.1–1.0)
Monocytes Relative: 8 %
Neutro Abs: 5.2 10*3/uL (ref 1.7–7.7)
Neutrophils Relative %: 63 %
Platelets: 240 10*3/uL (ref 150–400)
RBC: 3.89 MIL/uL (ref 3.87–5.11)
RDW: 14.6 % (ref 11.5–15.5)
WBC: 8.3 10*3/uL (ref 4.0–10.5)
nRBC: 0 % (ref 0.0–0.2)

## 2019-12-27 NOTE — Progress Notes (Addendum)
[facility]  PROGRESS NOTE  Sierra Banks MVH:[id number] DOB: [birth year] DOA: 12/24/2019 PCP: Josetta Huddle, MD Admit date - 12/24/2019   Admitting Physician Etta Quill, DO  Outpatient Primary MD for the patient is Josetta Huddle, MD  LOS - 3 Brief Narrative   [name] is a 69 year old female medical history significant for hypertension, mood disorder, hypothyroidism, HLD who presents with worsening shortness of breath, cough and fever x3 days.  Patient states [place] was [place] this past [place] within 24 hours notice scratchy throat followed by cough and fever 100 at home and worsening shortness of breath prompting her ED evaluation.  On admission patient was found to have a T-max of 101.6 tachypneic tachycardic otherwise hemodynamically stable lab work notable for Covid test negative, BNP 194, potassium 3.4, creatinine 1.2 consistent with baseline WBC of 24.3.  Patient is admitted with diagnosis of sepsis secondary to community-acquired pneumonia.  Of note initial chest x-ray showed no active disease.  Given patient's profound respiratory distress patient required BiPAP support in addition to IV ceftriaxone and azithromycin.  Hospital course complicated by persistently high O2 requirements despite being on IV antibiotics and no signs of volume overload on exam or CXR. TTE shows diastolic dysfunction but preserved EF. On 6 L O2 and has no O2 requirements at home. D-dimer elevated but CTA negative for PE.    Subjective  Feels breathing has improved.  Still wheezing. A & P  Sepsis secondary to multifocal community-acquired pneumonia with acute hypoxic respiratory failure.  Unclear organism but suspect likely gram-positive/atypical.    O2 requirements have decreased from 6 L to 2 L.  (down from 15 L on admission), audible wheezing still present dosing slightly decreased from yesterday.  CTA shows groundglass opacities consistent with multifocal infection.  Suspect  has some baseline lung dysfunction given prior Covid infection (reported in March 2021), Covid test negative this admission.   - IV ceftriaxone, azithromycin -Check sputum culture, Spna, legionella -wean o2 to maitain Spo2> 92% -Continue scheduled duo nebs -No blood cultures were obtained on admission prior to antibiotics, will repeat blood cultures if becomes febrile again --Incentive spirometry, flutter valve, encourage out of bed to chair  CHFpEF. Elevated BNP on admission. No volume overloaded on my exam. CXR unremarkable.  No pulmonary edema on CTA.  Preserved EF on TTE with hospital stay.  Euvolemic on exam --home Lasix -Monitor output  HTN, currently at goal -continue home blood pressure medications and close monitoring   HLD, stable -home statin  Mood disorder, stable - continue home wellbutrin, topamax  Family Communication  : Daughter updated at this time no change 13 Code Status :  FULL  Disposition Plan  :  Patient is from home. Anticipated d/c date: 2 to 3 days. Barriers to d/c or necessity for inpatient status: , Iv antibiotics, still requiring  supplemental oxygen to maintain normal oxygen saturation, still needs continued scheduled duo nebs consults  : None  Procedures  :  TTE  DVT Prophylaxis  :  Lovenox   MDM: The below labs and imaging reports were reviewed and summarized above.  Medication management as above.  Lab Results  Component Value Date   PLT 240 12/27/2019    Diet :  Diet Order            Diet regular Room service appropriate? Yes; Fluid consistency: Thin  Diet effective now  Inpatient Medications Scheduled Meds: . amLODipine  5 mg Oral Daily  . atenolol  50 mg Oral Daily  . buPROPion  150 mg Oral QHS  . cloNIDine  0.2 mg Oral QHS  . enoxaparin (LOVENOX) injection  40 mg Subcutaneous Daily  . furosemide  40 mg Oral q morning - 10a  . guaiFENesin  600 mg Oral BID  . ipratropium-albuterol  3 mL Nebulization Q6H  .  levothyroxine  50 mcg Oral QAC breakfast  . simvastatin  40 mg Oral Daily  . topiramate  50 mg Oral Daily  . venlafaxine XR  300 mg Oral QHS   Continuous Infusions: . azithromycin 500 mg (12/27/19 0909)  . cefTRIAXone (ROCEPHIN)  IV 2 g (12/27/19 0815)   PRN Meds:.acetaminophen, albuterol, guaiFENesin-dextromethorphan  Antibiotics  :   Anti-infectives (From admission, onward)   Start     Dose/Rate Route Frequency Ordered Stop   12/25/19 0800  cefTRIAXone (ROCEPHIN) 2 g in sodium chloride 0.9 % 100 mL IVPB        2 g 200 mL/hr over 30 Minutes Intravenous Every 24 hours 12/24/19 0557 12/30/19 0759   12/25/19 0800  azithromycin (ZITHROMAX) 500 mg in sodium chloride 0.9 % 250 mL IVPB        500 mg 250 mL/hr over 60 Minutes Intravenous Every 24 hours 12/24/19 0557 12/30/19 0759   12/24/19 0800  cefTRIAXone (ROCEPHIN) 1 g in sodium chloride 0.9 % 100 mL IVPB        1 g 200 mL/hr over 30 Minutes Intravenous  Once 12/24/19 0557 12/24/19 1101   12/24/19 0545  cefTRIAXone (ROCEPHIN) 1 g in sodium chloride 0.9 % 100 mL IVPB        1 g 200 mL/hr over 30 Minutes Intravenous  Once 12/24/19 0539 12/24/19 0636   12/24/19 0545  azithromycin (ZITHROMAX) 500 mg in sodium chloride 0.9 % 250 mL IVPB        500 mg 250 mL/hr over 60 Minutes Intravenous  Once 12/24/19 0539 12/24/19 0847       Objective   Vitals:   12/27/19 0750 12/27/19 0806 12/27/19 1212 12/27/19 1405  BP:  (!) 147/72 (!) 154/84   Pulse:      Resp:      Temp:  97.6 F (36.4 C) (!) 97.5 F (36.4 C)   TempSrc:  Oral Oral   SpO2: 100%   100%  Weight:      Height:        SpO2: 100 % O2 Flow Rate (L/min): 2 L/min FiO2 (%): 50 %  Wt Readings from Last 3 Encounters:  12/24/19 96.9 kg  11/20/19 96.9 kg  06/25/18 98.4 kg     Intake/Output Summary (Last 24 hours) at 12/27/2019 1450 Last data filed at 12/27/2019 1100 Gross per 24 hour  Intake 800 ml  Output --  Net 800 ml    Physical Exam:     Awake Alert,  Oriented X 3, Normal affect No new F.N deficits,  [place].AT, Normal respiratory effort on 2L, audible wheezing though decreased from prior exam, diffuse end expiratory wheezing throughout all lung fields RRR,No Gallops,Rubs or new Murmurs,  +ve B.Sounds, Abd Soft, No tenderness, No rebound, guarding or rigidity. No Cyanosis, No new Rash or bruise     I have personally reviewed the following:   Data Reviewed:  CBC Recent Labs  Lab 12/24/19 0332 12/24/19 1607 12/25/19 0134 12/26/19 0155 12/27/19 0439  WBC 24.3*  --  17.7* 10.4 8.3  HGB  13.3 12.6 11.8* 11.4* 11.2*  HCT 42.2 37.0 36.5 36.4 35.4*  PLT 296  --  226 204 240  MCV 89.8  --  89.7 91.0 91.0  MCH 28.3  --  29.0 28.5 28.8  MCHC 31.5  --  32.3 31.3 31.6  RDW 13.9  --  14.4 14.4 14.6  LYMPHSABS 1.1  --   --  1.5 2.1  MONOABS 1.4*  --   --  0.6 0.7  EOSABS 0.0  --   --  0.4 0.3  BASOSABS 0.1  --   --  0.0 0.0    Chemistries  Recent Labs  Lab 12/24/19 0332 12/24/19 1129 12/24/19 1607 12/25/19 0134 12/25/19 0752 12/26/19 0155 12/27/19 0439  NA 141  --  144 142  --  139 139  K 3.4*  --  3.8 3.4*  --  3.2* 3.6  CL 105  --   --  104  --  104 108  CO2 23  --   --  26  --  25 26  GLUCOSE 159*  --   --  109*  --  144* 91  BUN 13  --   --  24*  --  21 15  CREATININE 1.22*  --   --  1.30*  --  1.05* 1.05*  CALCIUM 9.6  --   --  9.4  --  9.3 9.3  MG  --  1.4*  --   --  1.8 1.9  --   AST 24  --   --  27  --   --   --   ALT 25  --   --  23  --   --   --   ALKPHOS 87  --   --  64  --   --   --   BILITOT 0.8  --   --  0.5  --   --   --    ------------------------------------------------------------------------------------------------------------------ No results for input(s): CHOL, HDL, LDLCALC, TRIG, CHOLHDL, LDLDIRECT in the last 72 hours.  No results found for: HGBA1C ------------------------------------------------------------------------------------------------------------------ No results for input(s): TSH,  T4TOTAL, T3FREE, THYROIDAB in the last 72 hours.  Invalid input(s): FREET3 ------------------------------------------------------------------------------------------------------------------ No results for input(s): VITAMINB12, FOLATE, FERRITIN, TIBC, IRON, RETICCTPCT in the last 72 hours.  Coagulation profile No results for input(s): INR, PROTIME in the last 168 hours.  Recent Labs    12/25/19 1225  DDIMER 3.31*    Cardiac Enzymes No results for input(s): CKMB, TROPONINI, MYOGLOBIN in the last 168 hours.  Invalid input(s): CK ------------------------------------------------------------------------------------------------------------------    Component Value Date/Time   BNP 194.0 (H) 12/24/2019 0086    Micro Results Recent Results (from the past 240 hour(s))  Respiratory Panel by RT PCR (Flu A&B, Covid) - Nasopharyngeal Swab     Status: None   Collection Time: 12/24/19  3:33 AM   Specimen: Nasopharyngeal Swab  Result Value Ref Range Status   SARS Coronavirus 2 by RT PCR NEGATIVE NEGATIVE Final    Comment: (NOTE) SARS-CoV-2 target nucleic acids are NOT DETECTED.  The SARS-CoV-2 RNA is generally detectable in upper respiratoy specimens during the acute phase of infection. The lowest concentration of SARS-CoV-2 viral copies this assay can detect is 131 copies/mL. A negative result does not preclude SARS-Cov-2 infection and should not be used as the sole basis for treatment or other patient management decisions. A negative result may occur with  improper specimen collection/handling, submission of specimen other than nasopharyngeal swab, presence of viral mutation(s)  within the areas targeted by this assay, and inadequate number of viral copies (<131 copies/mL). A negative result must be combined with clinical observations, patient history, and epidemiological information. The expected result is Negative.  Fact Sheet for Patients:   PinkCheek.be  Fact Sheet for Healthcare Providers:  GravelBags.it  This test is no t yet approved or cleared by the [place] FDA and  has been authorized for detection and/or diagnosis of SARS-CoV-2 by FDA under an Emergency Use Authorization (EUA). This EUA will remain  in effect (meaning this test can be used) for the duration of the COVID-19 declaration under Section 564(b)(1) of the Act, 21 U.S.C. section 360bbb-3(b)(1), unless the authorization is terminated or revoked sooner.     Influenza A by PCR NEGATIVE NEGATIVE Final   Influenza B by PCR NEGATIVE NEGATIVE Final    Comment: (NOTE) The Xpert Xpress SARS-CoV-2/FLU/RSV assay is intended as an aid in  the diagnosis of influenza from Nasopharyngeal swab specimens and  should not be used as a sole basis for treatment. Nasal washings and  aspirates are unacceptable for Xpert Xpress SARS-CoV-2/FLU/RSV  testing.  Fact Sheet for Patients: PinkCheek.be  Fact Sheet for Healthcare Providers: GravelBags.it  This test is not yet approved or cleared by the [place] FDA and  has been authorized for detection and/or diagnosis of SARS-CoV-2 by  FDA under an Emergency Use Authorization (EUA). This EUA will remain  in effect (meaning this test can be used) for the duration of the  Covid-19 declaration under Section 564(b)(1) of the Act, 21  U.S.C. section 360bbb-3(b)(1), unless the authorization is  terminated or revoked. Performed at [facility], [place] [address], [place], [place] [address]     Radiology Reports CT ANGIO CHEST PE W OR WO CONTRAST  Result Date: 12/25/2019 CLINICAL DATA:  PE suspected EXAM: CT ANGIOGRAPHY CHEST WITH CONTRAST TECHNIQUE: Multidetector CT imaging of the chest was performed using the standard protocol during bolus administration of intravenous contrast. Multiplanar CT image  reconstructions and MIPs were obtained to evaluate the vascular anatomy. CONTRAST:  80mL OMNIPAQUE IOHEXOL 350 MG/ML SOLN COMPARISON:  None. FINDINGS: Cardiovascular: Satisfactory opacification of the pulmonary arteries to the segmental level. No evidence of pulmonary embolism. Normal heart size. No pericardial effusion. Mediastinum/Nodes: No enlarged mediastinal, hilar, or axillary lymph nodes. Frothy debris in the lower trachea. Thyroid gland and esophagus demonstrate no significant findings. Lungs/Pleura: There is scattered bilateral heterogeneous and ground-glass airspace opacity throughout the lungs, with a more dense, bandlike consolidation or atelectasis of the left lung base (series 7, image 63). No pleural effusion or pneumothorax. Upper Abdomen: No acute abnormality. Musculoskeletal: No chest wall abnormality. No acute or significant osseous findings. Review of the MIP images confirms the above findings. IMPRESSION: 1. Negative examination for pulmonary embolism. 2. Scattered bilateral heterogeneous and ground-glass airspace opacity throughout the lungs, with a more dense, bandlike consolidation or atelectasis of the left lung base. Findings are consistent with multifocal infection, including COVID-19 if clinically suspected. Electronically Signed   By: Eddie Candle M.D.   On: 12/25/2019 18:10   DG Chest Port 1 View  Result Date: 12/24/2019 CLINICAL DATA:  Dyspnea EXAM: PORTABLE CHEST 1 VIEW COMPARISON:  May 12, 2011 FINDINGS: The heart size is stable. Aortic calcifications are noted. There is no pneumothorax or large pleural effusion. No focal infiltrate. No acute osseous abnormality. IMPRESSION: No active disease. Electronically Signed   By: Constance Holster M.D.   On: 12/24/2019 03:56   ECHOCARDIOGRAM COMPLETE  Result Date: 12/24/2019    ECHOCARDIOGRAM REPORT   Patient Name:   Sierra Banks Date of Exam: 12/24/2019 Medical Rec #:  [id number]        Height:       64.0 in Accession #:     [id number]       Weight:       213.6 lb Date of Birth:  Sep 09, 1950        BSA:          2.012 m Patient Age:    53 years         BP:           143/81 mmHg Patient Gender: F                HR:           86 bpm. Exam Location:  Inpatient Procedure: 2D Echo, Cardiac Doppler and Color Doppler Indications:    I50.33 Acute on chronic diastolic (congestive) heart failure  History:        Patient has prior history of Echocardiogram examinations, most                 recent 05/11/2011. Risk Factors:Hypertension and Dyslipidemia.                 Hypothyroidism. GERD.  Sonographer:    Jonelle Sidle Dance Referring Phys: [place]  1. Left ventricular ejection fraction, by estimation, is 60 to 65%. The left ventricle has normal function. The left ventricle has no regional wall motion abnormalities. Left ventricular diastolic parameters are consistent with Grade I diastolic dysfunction (impaired relaxation).  2. Right ventricular systolic function is normal. The right ventricular size is normal.  3. The mitral valve is normal in structure. No evidence of mitral valve regurgitation. No evidence of mitral stenosis.  4. The aortic valve is normal in structure. Aortic valve regurgitation is not visualized. No aortic stenosis is present.  5. The inferior vena cava is normal in size with greater than 50% respiratory variability, suggesting right atrial pressure of 3 mmHg. FINDINGS  Left Ventricle: Left ventricular ejection fraction, by estimation, is 60 to 65%. The left ventricle has normal function. The left ventricle has no regional wall motion abnormalities. The left ventricular internal cavity size was normal in size. There is  no left ventricular hypertrophy. Left ventricular diastolic parameters are consistent with Grade I diastolic dysfunction (impaired relaxation). Right Ventricle: The right ventricular size is normal. No increase in right ventricular wall thickness. Right ventricular systolic function is  normal. Left Atrium: Left atrial size was normal in size. Right Atrium: Right atrial size was normal in size. Pericardium: There is no evidence of pericardial effusion. Mitral Valve: The mitral valve is normal in structure. No evidence of mitral valve regurgitation. No evidence of mitral valve stenosis. Tricuspid Valve: The tricuspid valve is normal in structure. Tricuspid valve regurgitation is not demonstrated. No evidence of tricuspid stenosis. Aortic Valve: The aortic valve is normal in structure. Aortic valve regurgitation is not visualized. No aortic stenosis is present. Pulmonic Valve: The pulmonic valve was normal in structure. Pulmonic valve regurgitation is not visualized. No evidence of pulmonic stenosis. Aorta: The aortic root is normal in size and structure. Venous: The inferior vena cava is normal in size with greater than 50% respiratory variability, suggesting right atrial pressure of 3 mmHg. IAS/Shunts: No atrial level shunt detected by color flow Doppler.  LEFT VENTRICLE PLAX 2D LVIDd:  4.10 cm  Diastology LVIDs:         2.87 cm  LV e' medial:    6.64 cm/s LV PW:         1.30 cm  LV E/e' medial:  11.9 LV IVS:        1.09 cm  LV e' lateral:   6.20 cm/s LVOT diam:     2.10 cm  LV E/e' lateral: 12.7 LV SV:         80 LV SV Index:   40 LVOT Area:     3.46 cm  RIGHT VENTRICLE             IVC RV Basal diam:  2.20 cm     IVC diam: 1.78 cm RV S prime:     10.00 cm/s TAPSE (M-mode): 1.7 cm LEFT ATRIUM             Index       RIGHT ATRIUM          Index LA diam:        4.40 cm 2.19 cm/m  RA Area:     9.76 cm LA Vol (A2C):   39.3 ml 19.53 ml/m RA Volume:   16.00 ml 7.95 ml/m LA Vol (A4C):   46.0 ml 22.86 ml/m LA Biplane Vol: 44.4 ml 22.07 ml/m  AORTIC VALVE LVOT Vmax:   98.30 cm/s LVOT Vmean:  70.800 cm/s LVOT VTI:    0.231 m  AORTA Ao Root diam: 3.10 cm Ao Asc diam:  3.30 cm MITRAL VALVE MV Area (PHT): 2.60 cm    SHUNTS MV Decel Time: 292 msec    Systemic VTI:  0.23 m MV E velocity: 78.80 cm/s   Systemic Diam: 2.10 cm MV A velocity: 97.90 cm/s MV E/A ratio:  0.80 Candee Furbish MD Electronically signed by Candee Furbish MD Signature Date/Time: 12/24/2019/11:02:27 AM    Final      Time Spent in minutes  30     Desiree Hane M.D on 12/27/2019 at 2:50 PM  To page go to [email or web address] - password [email or web address]

## 2019-12-28 LAB — CBC WITH DIFFERENTIAL/PLATELET
Abs Immature Granulocytes: 0.08 10*3/uL — ABNORMAL HIGH (ref 0.00–0.07)
Basophils Absolute: 0.1 10*3/uL (ref 0.0–0.1)
Basophils Relative: 1 %
Eosinophils Absolute: 0.3 10*3/uL (ref 0.0–0.5)
Eosinophils Relative: 3 %
HCT: 35.8 % — ABNORMAL LOW (ref 36.0–46.0)
Hemoglobin: 11.3 g/dL — ABNORMAL LOW (ref 12.0–15.0)
Immature Granulocytes: 1 %
Lymphocytes Relative: 32 %
Lymphs Abs: 2.6 10*3/uL (ref 0.7–4.0)
MCH: 28 pg (ref 26.0–34.0)
MCHC: 31.6 g/dL (ref 30.0–36.0)
MCV: 88.8 fL (ref 80.0–100.0)
Monocytes Absolute: 0.7 10*3/uL (ref 0.1–1.0)
Monocytes Relative: 8 %
Neutro Abs: 4.5 10*3/uL (ref 1.7–7.7)
Neutrophils Relative %: 55 %
Platelets: 245 10*3/uL (ref 150–400)
RBC: 4.03 MIL/uL (ref 3.87–5.11)
RDW: 14.3 % (ref 11.5–15.5)
WBC: 8.3 10*3/uL (ref 4.0–10.5)
nRBC: 0 % (ref 0.0–0.2)

## 2019-12-28 LAB — STREP PNEUMONIAE URINARY ANTIGEN: Strep Pneumo Urinary Antigen: NEGATIVE

## 2019-12-28 MED ORDER — PREDNISONE 20 MG PO TABS
40.0000 mg | ORAL_TABLET | Freq: Every day | ORAL | Status: DC
  Administered 2019-12-28 – 2019-12-29 (×2): 40 mg via ORAL
  Filled 2019-12-28 (×2): qty 2

## 2019-12-28 MED ORDER — AZITHROMYCIN 250 MG PO TABS
500.0000 mg | ORAL_TABLET | Freq: Once | ORAL | Status: AC
  Administered 2019-12-29: 500 mg via ORAL
  Filled 2019-12-28: qty 2

## 2019-12-28 MED ORDER — IPRATROPIUM-ALBUTEROL 0.5-2.5 (3) MG/3ML IN SOLN: 3.0000 mL | Freq: Four times a day (QID) | RESPIRATORY_TRACT | Status: DC | PRN

## 2019-12-28 MED ORDER — HYDRALAZINE HCL 20 MG/ML IJ SOLN
5.0000 mg | Freq: Four times a day (QID) | INTRAMUSCULAR | Status: DC | PRN
  Administered 2019-12-28: 10 mg via INTRAVENOUS
  Filled 2019-12-28: qty 1

## 2019-12-28 MED ORDER — CEFDINIR 300 MG PO CAPS
300.0000 mg | ORAL_CAPSULE | Freq: Two times a day (BID) | ORAL | Status: DC
  Administered 2019-12-29: 300 mg via ORAL
  Filled 2019-12-28 (×2): qty 1

## 2019-12-28 NOTE — Progress Notes (Signed)
Dr. Lonny Prude notified about patient's B/P. She will place order for Hydralazine.

## 2019-12-28 NOTE — Progress Notes (Addendum)
[facility]  PROGRESS NOTE  EMI LYMON LNL:[id number] DOB: Jul 02, 1950 DOA: 12/24/2019 PCP: Josetta Huddle, MD Admit date - 12/24/2019   Admitting Physician Etta Quill, DO  Outpatient Primary MD for the patient is Josetta Huddle, MD  LOS - 4 Brief Narrative   [name] is a 69 year old female medical history significant for hypertension, mood disorder, hypothyroidism, HLD who presents with worsening shortness of breath, cough and fever x3 days.  Patient states she was [place] local festival this past [place] within 24 hours notice scratchy throat followed by cough and fever 100 at home and worsening shortness of breath prompting her ED evaluation.  On admission patient was found to have a T-max of 101.6 tachypneic tachycardic otherwise hemodynamically stable lab work notable for Covid test negative, BNP 194, potassium 3.4, creatinine 1.2 consistent with baseline WBC of 24.3.  Patient is admitted with diagnosis of sepsis secondary to community-acquired pneumonia.  Of note initial chest x-ray showed no active disease.  Given patient's profound respiratory distress patient required BiPAP support in addition to IV ceftriaxone and azithromycin.  Hospital course complicated by persistently high O2 requirements despite being on IV antibiotics and no signs of volume overload on exam or CXR. TTE shows diastolic dysfunction but preserved EF. On 6 L O2 and has no O2 requirements at home. D-dimer elevated but CTA negative for PE.    Subjective  Feels breathing has continued to improve.  Still has cough, not really productive.  Still wheezing slightly.  Sitting in bedside chair on room air. A & P  Sepsis secondary to multifocal community-acquired pneumonia with acute hypoxic respiratory failure.  Sepsis physiology resolved. Unclear organism but suspect likely gram-positive/atypical.    O2 requirements have decreased from 6 L to room air.  (down from 15 L on admission), audible wheezing  still present but continues to be significantly decreased from prior exams.  CTA shows groundglass opacities consistent with multifocal infection.  Suspect has some baseline lung dysfunction given prior Covid infection (reported in March 2021), Covid test negative this admission.  Also suspect a degree of chronic bronchitis given persistent wheezing - IV ceftriaxone, azithromycin today, transition to oral azithromycin, cefdenir tomorrow -Add oral prednisone 40 mg x 7 days for wheezing/bronchitis -Pending sputum culture, Spna, legionella -Ambulatory O2 testing -Change inhalers to as needed -No blood cultures were obtained on admission prior to antibiotics, will repeat blood cultures if becomes febrile again --Incentive spirometry, flutter valve, encourage out of bed to chair  Chronic congestive heart failure with preserved ejection fraction. Elevated BNP on admission. Not volume overloaded on my exam. CXR unremarkable.  No pulmonary edema on CTA.  Preserved EF on TTE with hospital stay.  Euvolemic on exam --home Lasix -Monitor output  HTN, currently at goal -continue home blood pressure medications and close monitoring   HLD, stable -home statin  Mood disorder, stable - continue home wellbutrin, topamax  Family Communication  : Daughter updated at bedside on 11/14 Code Status :  FULL  Disposition Plan  :  Patient is from home. Anticipated d/c date:  11/15. Barriers to d/c or necessity for inpatient status:  Close monitoring respiratory status, ambulatory O2 testing, if remains clinically stable and excellent for hours anticipate discharge on 11/15 consults  : None  Procedures  :  TTE  DVT Prophylaxis  :  Lovenox   MDM: The below labs and imaging reports were reviewed and summarized above.  Medication management as above.  Lab Results  Component Value Date  PLT 245 12/28/2019    Diet :  Diet Order            Diet regular Room service appropriate? Yes; Fluid consistency: Thin   Diet effective now                  Inpatient Medications Scheduled Meds: . amLODipine  5 mg Oral Daily  . atenolol  50 mg Oral Daily  . [START ON 12/29/2019] azithromycin  500 mg Oral Once  . buPROPion  150 mg Oral QHS  . cloNIDine  0.2 mg Oral QHS  . enoxaparin (LOVENOX) injection  40 mg Subcutaneous Daily  . furosemide  40 mg Oral q morning - 10a  . guaiFENesin  600 mg Oral BID  . levothyroxine  50 mcg Oral QAC breakfast  . predniSONE  40 mg Oral Q breakfast  . simvastatin  40 mg Oral Daily  . topiramate  50 mg Oral Daily  . venlafaxine XR  300 mg Oral QHS   Continuous Infusions:  PRN Meds:.acetaminophen, albuterol, guaiFENesin-dextromethorphan, ipratropium-albuterol  Antibiotics  :   Anti-infectives (From admission, onward)   Start     Dose/Rate Route Frequency Ordered Stop   12/29/19 1000  azithromycin (ZITHROMAX) tablet 500 mg        500 mg Oral  Once 12/28/19 1310     12/25/19 0800  cefTRIAXone (ROCEPHIN) 2 g in sodium chloride 0.9 % 100 mL IVPB  Status:  Discontinued        2 g 200 mL/hr over 30 Minutes Intravenous Every 24 hours 12/24/19 0557 12/28/19 1307   12/25/19 0800  azithromycin (ZITHROMAX) 500 mg in sodium chloride 0.9 % 250 mL IVPB  Status:  Discontinued        500 mg 250 mL/hr over 60 Minutes Intravenous Every 24 hours 12/24/19 0557 12/28/19 1307   12/24/19 0800  cefTRIAXone (ROCEPHIN) 1 g in sodium chloride 0.9 % 100 mL IVPB        1 g 200 mL/hr over 30 Minutes Intravenous  Once 12/24/19 0557 12/24/19 1101   12/24/19 0545  cefTRIAXone (ROCEPHIN) 1 g in sodium chloride 0.9 % 100 mL IVPB        1 g 200 mL/hr over 30 Minutes Intravenous  Once 12/24/19 0539 12/24/19 0636   12/24/19 0545  azithromycin (ZITHROMAX) 500 mg in sodium chloride 0.9 % 250 mL IVPB        500 mg 250 mL/hr over 60 Minutes Intravenous  Once 12/24/19 0539 12/24/19 0847       Objective   Vitals:   12/28/19 0233 12/28/19 0325 12/28/19 0728 12/28/19 1126  BP:  (!) 147/71  (!)  155/88  Pulse: 73 71  63  Resp: 14 20  20   Temp:  98.3 F (36.8 C)  98.6 F (37 C)  TempSrc:  Oral  Oral  SpO2:  97% 100% 93%  Weight:      Height:        SpO2: 93 % O2 Flow Rate (L/min): 1 L/min FiO2 (%): 50 %  Wt Readings from Last 3 Encounters:  12/24/19 96.9 kg  11/20/19 96.9 kg  06/25/18 98.4 kg     Intake/Output Summary (Last 24 hours) at 12/28/2019 1310 Last data filed at 12/27/2019 1700 Gross per 24 hour  Intake 200 ml  Output --  Net 200 ml    Physical Exam:     Awake Alert, Oriented X 3, Normal affect No new F.N deficits,  .AT, Normal respiratory effort on  room air, audible wheezing though decreased from prior exam, diffuse end expiratory wheezing throughout all lung fields but much decreased from prior exams, no crackles RRR,No Gallops,Rubs or new Murmurs,  +ve B.Sounds, Abd Soft, No tenderness, No rebound, guarding or rigidity. No Cyanosis, No new Rash or bruise     I have personally reviewed the following:   Data Reviewed:  CBC Recent Labs  Lab 12/24/19 0332 12/24/19 0332 12/24/19 1607 12/25/19 0134 12/26/19 0155 12/27/19 0439 12/28/19 0050  WBC 24.3*  --   --  17.7* 10.4 8.3 8.3  HGB 13.3   < > 12.6 11.8* 11.4* 11.2* 11.3*  HCT 42.2   < > 37.0 36.5 36.4 35.4* 35.8*  PLT 296  --   --  226 204 240 245  MCV 89.8  --   --  89.7 91.0 91.0 88.8  MCH 28.3  --   --  29.0 28.5 28.8 28.0  MCHC 31.5  --   --  32.3 31.3 31.6 31.6  RDW 13.9  --   --  14.4 14.4 14.6 14.3  LYMPHSABS 1.1  --   --   --  1.5 2.1 2.6  MONOABS 1.4*  --   --   --  0.6 0.7 0.7  EOSABS 0.0  --   --   --  0.4 0.3 0.3  BASOSABS 0.1  --   --   --  0.0 0.0 0.1   < > = values in this interval not displayed.    Chemistries  Recent Labs  Lab 12/24/19 0332 12/24/19 1129 12/24/19 1607 12/25/19 0134 12/25/19 0752 12/26/19 0155 12/27/19 0439  NA 141  --  144 142  --  139 139  K 3.4*  --  3.8 3.4*  --  3.2* 3.6  CL 105  --   --  104  --  104 108  CO2 23  --   --  26   --  25 26  GLUCOSE 159*  --   --  109*  --  144* 91  BUN 13  --   --  24*  --  21 15  CREATININE 1.22*  --   --  1.30*  --  1.05* 1.05*  CALCIUM 9.6  --   --  9.4  --  9.3 9.3  MG  --  1.4*  --   --  1.8 1.9  --   AST 24  --   --  27  --   --   --   ALT 25  --   --  23  --   --   --   ALKPHOS 87  --   --  64  --   --   --   BILITOT 0.8  --   --  0.5  --   --   --    ------------------------------------------------------------------------------------------------------------------ No results for input(s): CHOL, HDL, LDLCALC, TRIG, CHOLHDL, LDLDIRECT in the last 72 hours.  No results found for: HGBA1C ------------------------------------------------------------------------------------------------------------------ No results for input(s): TSH, T4TOTAL, T3FREE, THYROIDAB in the last 72 hours.  Invalid input(s): FREET3 ------------------------------------------------------------------------------------------------------------------ No results for input(s): VITAMINB12, FOLATE, FERRITIN, TIBC, IRON, RETICCTPCT in the last 72 hours.  Coagulation profile No results for input(s): INR, PROTIME in the last 168 hours.  No results for input(s): DDIMER in the last 72 hours.  Cardiac Enzymes No results for input(s): CKMB, TROPONINI, MYOGLOBIN in the last 168 hours.  Invalid input(s): CK ------------------------------------------------------------------------------------------------------------------    Component Value Date/Time  BNP 194.0 (H) 12/24/2019 9983    Micro Results Recent Results (from the past 240 hour(s))  Respiratory Panel by RT PCR (Flu A&B, Covid) - Nasopharyngeal Swab     Status: None   Collection Time: 12/24/19  3:33 AM   Specimen: Nasopharyngeal Swab  Result Value Ref Range Status   SARS Coronavirus 2 by RT PCR NEGATIVE NEGATIVE Final    Comment: (NOTE) SARS-CoV-2 target nucleic acids are NOT DETECTED.  The SARS-CoV-2 RNA is generally detectable in upper  respiratoy specimens during the acute phase of infection. The lowest concentration of SARS-CoV-2 viral copies this assay can detect is 131 copies/mL. A negative result does not preclude SARS-Cov-2 infection and should not be used as the sole basis for treatment or other patient management decisions. A negative result may occur with  improper specimen collection/handling, submission of specimen other than nasopharyngeal swab, presence of viral mutation(s) within the areas targeted by this assay, and inadequate number of viral copies (<131 copies/mL). A negative result must be combined with clinical observations, patient history, and epidemiological information. The expected result is Negative.  Fact Sheet for Patients:  PinkCheek.be  Fact Sheet for Healthcare Providers:  GravelBags.it  This test is no t yet approved or cleared by the [place] FDA and  has been authorized for detection and/or diagnosis of SARS-CoV-2 by FDA under an Emergency Use Authorization (EUA). This EUA will remain  in effect (meaning this test can be used) for the duration of the COVID-19 declaration under Section 564(b)(1) of the Act, 21 U.S.C. section 360bbb-3(b)(1), unless the authorization is terminated or revoked sooner.     Influenza A by PCR NEGATIVE NEGATIVE Final   Influenza B by PCR NEGATIVE NEGATIVE Final    Comment: (NOTE) The Xpert Xpress SARS-CoV-2/FLU/RSV assay is intended as an aid in  the diagnosis of influenza from Nasopharyngeal swab specimens and  should not be used as a sole basis for treatment. Nasal washings and  aspirates are unacceptable for Xpert Xpress SARS-CoV-2/FLU/RSV  testing.  Fact Sheet for Patients: PinkCheek.be  Fact Sheet for Healthcare Providers: GravelBags.it  This test is not yet approved or cleared by the [place] FDA and  has been  authorized for detection and/or diagnosis of SARS-CoV-2 by  FDA under an Emergency Use Authorization (EUA). This EUA will remain  in effect (meaning this test can be used) for the duration of the  Covid-19 declaration under Section 564(b)(1) of the Act, 21  U.S.C. section 360bbb-3(b)(1), unless the authorization is  terminated or revoked. Performed at [facility], [place] [address] [place], [place] [address]     Radiology Reports CT ANGIO CHEST PE W OR WO CONTRAST  Result Date: 12/25/2019 CLINICAL DATA:  PE suspected EXAM: CT ANGIOGRAPHY CHEST WITH CONTRAST TECHNIQUE: Multidetector CT imaging of the chest was performed using the standard protocol during bolus administration of intravenous contrast. Multiplanar CT image reconstructions and MIPs were obtained to evaluate the vascular anatomy. CONTRAST:  10mL OMNIPAQUE IOHEXOL 350 MG/ML SOLN COMPARISON:  None. FINDINGS: Cardiovascular: Satisfactory opacification of the pulmonary arteries to the segmental level. No evidence of pulmonary embolism. Normal heart size. No pericardial effusion. Mediastinum/Nodes: No enlarged mediastinal, hilar, or axillary lymph nodes. Frothy debris in the lower trachea. Thyroid gland and esophagus demonstrate no significant findings. Lungs/Pleura: There is scattered bilateral heterogeneous and ground-glass airspace opacity throughout the lungs, with a more dense, bandlike consolidation or atelectasis of the left lung base (series 7, image 63). No pleural effusion or pneumothorax. Upper  Abdomen: No acute abnormality. Musculoskeletal: No chest wall abnormality. No acute or significant osseous findings. Review of the MIP images confirms the above findings. IMPRESSION: 1. Negative examination for pulmonary embolism. 2. Scattered bilateral heterogeneous and ground-glass airspace opacity throughout the lungs, with a more dense, bandlike consolidation or atelectasis of the left lung base. Findings are consistent with multifocal  infection, including COVID-19 if clinically suspected. Electronically Signed   By: Eddie Candle M.D.   On: 12/25/2019 18:10   DG Chest Port 1 View  Result Date: 12/24/2019 CLINICAL DATA:  Dyspnea EXAM: PORTABLE CHEST 1 VIEW COMPARISON:  May 12, 2011 FINDINGS: The heart size is stable. Aortic calcifications are noted. There is no pneumothorax or large pleural effusion. No focal infiltrate. No acute osseous abnormality. IMPRESSION: No active disease. Electronically Signed   By: Constance Holster M.D.   On: 12/24/2019 03:56   ECHOCARDIOGRAM COMPLETE  Result Date: 12/24/2019    ECHOCARDIOGRAM REPORT   Patient Name:   GIULLIANA MCROBERTS Date of Exam: 12/24/2019 Medical Rec #:  [id number]        Height:       64.0 in Accession #:    [id number]       Weight:       213.6 lb Date of Birth:  06-Mar-1950        BSA:          2.012 m Patient Age:    6 years         BP:           143/81 mmHg Patient Gender: F                HR:           86 bpm. Exam Location:  Inpatient Procedure: 2D Echo, Cardiac Doppler and Color Doppler Indications:    I50.33 Acute on chronic diastolic (congestive) heart failure  History:        Patient has prior history of Echocardiogram examinations, most                 recent 05/11/2011. Risk Factors:Hypertension and Dyslipidemia.                 Hypothyroidism. GERD.  Sonographer:    Jonelle Sidle Dance Referring Phys: [place]  1. Left ventricular ejection fraction, by estimation, is 60 to 65%. The left ventricle has normal function. The left ventricle has no regional wall motion abnormalities. Left ventricular diastolic parameters are consistent with Grade I diastolic dysfunction (impaired relaxation).  2. Right ventricular systolic function is normal. The right ventricular size is normal.  3. The mitral valve is normal in structure. No evidence of mitral valve regurgitation. No evidence of mitral stenosis.  4. The aortic valve is normal in structure. Aortic valve  regurgitation is not visualized. No aortic stenosis is present.  5. The inferior vena cava is normal in size with greater than 50% respiratory variability, suggesting right atrial pressure of 3 mmHg. FINDINGS  Left Ventricle: Left ventricular ejection fraction, by estimation, is 60 to 65%. The left ventricle has normal function. The left ventricle has no regional wall motion abnormalities. The left ventricular internal cavity size was normal in size. There is  no left ventricular hypertrophy. Left ventricular diastolic parameters are consistent with Grade I diastolic dysfunction (impaired relaxation). Right Ventricle: The right ventricular size is normal. No increase in right ventricular wall thickness. Right ventricular systolic function is normal. Left Atrium: Left atrial size was normal  in size. Right Atrium: Right atrial size was normal in size. Pericardium: There is no evidence of pericardial effusion. Mitral Valve: The mitral valve is normal in structure. No evidence of mitral valve regurgitation. No evidence of mitral valve stenosis. Tricuspid Valve: The tricuspid valve is normal in structure. Tricuspid valve regurgitation is not demonstrated. No evidence of tricuspid stenosis. Aortic Valve: The aortic valve is normal in structure. Aortic valve regurgitation is not visualized. No aortic stenosis is present. Pulmonic Valve: The pulmonic valve was normal in structure. Pulmonic valve regurgitation is not visualized. No evidence of pulmonic stenosis. Aorta: The aortic root is normal in size and structure. Venous: The inferior vena cava is normal in size with greater than 50% respiratory variability, suggesting right atrial pressure of 3 mmHg. IAS/Shunts: No atrial level shunt detected by color flow Doppler.  LEFT VENTRICLE PLAX 2D LVIDd:         4.10 cm  Diastology LVIDs:         2.87 cm  LV e' medial:    6.64 cm/s LV PW:         1.30 cm  LV E/e' medial:  11.9 LV IVS:        1.09 cm  LV e' lateral:   6.20 cm/s  LVOT diam:     2.10 cm  LV E/e' lateral: 12.7 LV SV:         80 LV SV Index:   40 LVOT Area:     3.46 cm  RIGHT VENTRICLE             IVC RV Basal diam:  2.20 cm     IVC diam: 1.78 cm RV S prime:     10.00 cm/s TAPSE (M-mode): 1.7 cm LEFT ATRIUM             Index       RIGHT ATRIUM          Index LA diam:        4.40 cm 2.19 cm/m  RA Area:     9.76 cm LA Vol (A2C):   39.3 ml 19.53 ml/m RA Volume:   16.00 ml 7.95 ml/m LA Vol (A4C):   46.0 ml 22.86 ml/m LA Biplane Vol: 44.4 ml 22.07 ml/m  AORTIC VALVE LVOT Vmax:   98.30 cm/s LVOT Vmean:  70.800 cm/s LVOT VTI:    0.231 m  AORTA Ao Root diam: 3.10 cm Ao Asc diam:  3.30 cm MITRAL VALVE MV Area (PHT): 2.60 cm    SHUNTS MV Decel Time: 292 msec    Systemic VTI:  0.23 m MV E velocity: 78.80 cm/s  Systemic Diam: 2.10 cm MV A velocity: 97.90 cm/s MV E/A ratio:  0.80 Candee Furbish MD Electronically signed by Candee Furbish MD Signature Date/Time: 12/24/2019/11:02:27 AM    Final      Time Spent in minutes  30     Desiree Hane M.D on 12/28/2019 at 1:10 PM  To page go to [email or web address] - password [facility]

## 2019-12-29 LAB — CBC
HCT: 39.5 % (ref 36.0–46.0)
Hemoglobin: 12.5 g/dL (ref 12.0–15.0)
MCH: 27.8 pg (ref 26.0–34.0)
MCHC: 31.6 g/dL (ref 30.0–36.0)
MCV: 88 fL (ref 80.0–100.0)
Platelets: 316 10*3/uL (ref 150–400)
RBC: 4.49 MIL/uL (ref 3.87–5.11)
RDW: 14.3 % (ref 11.5–15.5)
WBC: 15 10*3/uL — ABNORMAL HIGH (ref 4.0–10.5)
nRBC: 0 % (ref 0.0–0.2)

## 2019-12-29 LAB — BASIC METABOLIC PANEL
Anion gap: 3 — ABNORMAL LOW (ref 5–15)
BUN: 13 mg/dL (ref 8–23)
CO2: 26 mmol/L (ref 22–32)
Calcium: 9.9 mg/dL (ref 8.9–10.3)
Chloride: 108 mmol/L (ref 98–111)
Creatinine, Ser: 1.02 mg/dL — ABNORMAL HIGH (ref 0.44–1.00)
GFR, Estimated: 59 mL/min — ABNORMAL LOW (ref >60)
Glucose, Bld: 177 mg/dL — ABNORMAL HIGH (ref 70–99)
Potassium: 4.4 mmol/L (ref 3.5–5.1)
Sodium: 137 mmol/L (ref 135–145)

## 2019-12-29 MED ORDER — CEFDINIR 300 MG PO CAPS: 300.0000 mg | ORAL_CAPSULE | Freq: Two times a day (BID) | ORAL | 0 refills | Status: AC

## 2019-12-29 MED ORDER — ALBUTEROL SULFATE HFA 108 (90 BASE) MCG/ACT IN AERS: 2.0000 | INHALATION_SPRAY | Freq: Four times a day (QID) | RESPIRATORY_TRACT | 2 refills | Status: AC | PRN

## 2019-12-29 MED ORDER — AMLODIPINE BESYLATE 10 MG PO TABS
10.0000 mg | ORAL_TABLET | Freq: Every day | ORAL | Status: DC
  Administered 2019-12-29: 10 mg via ORAL
  Filled 2019-12-29: qty 1

## 2019-12-29 MED ORDER — PREDNISONE 10 MG PO TABS
ORAL_TABLET | ORAL | 0 refills | Status: DC
Start: 2019-12-29 — End: 2023-12-18

## 2019-12-29 MED ORDER — GUAIFENESIN ER 600 MG PO TB12: 600.0000 mg | ORAL_TABLET | Freq: Two times a day (BID) | ORAL | 0 refills | Status: AC | PRN

## 2019-12-29 MED ORDER — AMLODIPINE BESYLATE 5 MG PO TABS
10.0000 mg | ORAL_TABLET | Freq: Every day | ORAL | 1 refills | Status: DC
Start: 2019-12-29 — End: 2023-12-18

## 2019-12-29 MED ORDER — MELATONIN 5 MG PO TABS
5.0000 mg | ORAL_TABLET | Freq: Every evening | ORAL | Status: DC | PRN
  Administered 2019-12-29: 5 mg via ORAL
  Filled 2019-12-29: qty 1

## 2019-12-29 NOTE — Discharge Summary (Signed)
Sierra Banks IWP:[id number] DOB: 1950/06/19 DOA: 12/24/2019  PCP: Josetta Huddle, MD  Admit date: 12/24/2019 Discharge date: 12/29/2019  Admitted From: home Disposition:  home  Recommendations for Outpatient Follow-up:  1. Follow up with PCP in 1-2 weeks 2. Prednisone taper, cefdenir x 1 day 3. Mucinex, albuterol PRN, flutter valve, incentive spirometer  4. Please follow up on the following pending results:  Home Health:none  Equipment/Devices: none  Discharge [place]  Brief/Interim Summary: Hospital Course:  [name] is a 69 year old female medical history significant for hypertension, mood disorder, hypothyroidism, HLD who presents with worsening shortness of breath, cough and fever x3 days. Patient states she was at a local festival this past Saturday within 24 hours notice scratchy throat followed by cough and fever 100 at home and worsening shortness of breath prompting her ED evaluation. On admission patient was found to have a T-max of 101.6 tachypneic tachycardic otherwise hemodynamically stable lab work notable for Covid test negative, BNP 194, potassium 3.4, creatinine 1.2 consistent with baseline WBC of 24.3. Patient is admitted with diagnosis of sepsis secondary to community-acquired pneumonia. Of note initial chest x-ray showed no active disease. Given patient's profound respiratory distress patient required BiPAP support in addition to IV ceftriaxone and azithromycin.  Hospital course complicated by persistently high O2 requirements despite being on IV antibiotics and no signs of volume overload on exam or CXR. TTE shows diastolic dysfunction but preserved EF. On 6 L O2 and has no O2 requirements at home. D-dimer elevated but CTA negative for PE.    Sepsis secondary to multifocal community-acquired pneumoniawith acute hypoxic respiratory failure.  Sepsis physiology resolved.Unclear organism but suspect likely  gram-positive/atypical.  O2 requirements  from peak of 15L to room air with scheduled inhalers, prednisone and antibiotics.   CTA shows groundglass opacities consistent with multifocal infection.  Suspect has some baseline lung dysfunction given prior Covid infection (reported in March 2021), Covid test negative this admission.  Also suspect a degree of chronic bronchitis given persistent wheezing--no longer wheezing on day of discharge. -Remained afebrile on transition to cefdenir, one more dose remaining on disdharge -continue prednisone taper on discharge -Strep pna negative, cough was not productive for sputum culture. Legionella pending on discharge (completed 5 days of azithromycin in hospital) -Maintained adequate oxygenation without need of supplementation on ambulation prior to discharge -PRN albuterol and mucinex on discharge  --Incentive spirometry, flutter valve,   Chronic congestive heart failure with preserved ejection fraction. Elevated BNP on admission. Not volume overloaded on my exam. CXR unremarkable.  No pulmonary edema on CTA.  Preserved EF on TTE this hospital stay.  Euvolemic on exam. S/p 1 dose of IV lasix on admission but stayed on home lasix regimen remainder of hospital stay --continue home Lasix  HTN, elevated to 150s-160s likely in setting of using prednisone -increased her amlodipine to 10 mg --advise close follow up with her PCP for BP check  HLD, stable -home statin  Mood disorder, stable - continue home wellbutrin, topamax   Consultations:  none  Procedures/Studies: TTE, 12/24/19  1. Left ventricular ejection fraction, by estimation, is 60 to 65%. The  left ventricle has normal function. The left ventricle has no regional  wall motion abnormalities. Left ventricular diastolic parameters are  consistent with Grade I diastolic  dysfunction (impaired relaxation).  2. Right ventricular systolic function is normal. The right ventricular  size is  normal.  3. The mitral valve is normal in structure. No evidence of mitral valve  regurgitation. No evidence of mitral stenosis.  4. The aortic valve is normal in structure. Aortic valve regurgitation is  not visualized. No aortic stenosis is present.  5. The inferior vena cava is normal in size with greater than 50%  respiratory variability, suggesting right atrial pressure of 3 mmHg.    Subjective: Feels well. Mild cough. No longer wheezing Discharge Exam: Vitals:   12/29/19 0814 12/29/19 1200  BP: (!) 159/80 (!) 164/83  Pulse: 69 65  Resp: 17 18  Temp:  97.8 F (36.6 C)  SpO2: 97% 92%   Vitals:   12/29/19 0444 12/29/19 0736 12/29/19 0814 12/29/19 1200  BP: (!) 162/80 (!) 159/80 (!) 159/80 (!) 164/83  Pulse: 72 73 69 65  Resp: 16 20 17 18   Temp: 98.5 F (36.9 C) 97.9 F (36.6 C)  97.8 F (36.6 C)  TempSrc: Oral Oral  Oral  SpO2: 96% 94% 97% 92%  Weight:      Height:        General: Lying in bed, no apparent distress Eyes: EOMI, anicteric ENT: Oral Mucosa clear and moist Cardiovascular: regular rate and rhythm, no murmurs, rubs or gallops, no edema, Respiratory: Normal respiratory effort on room air, scant wheezing, decreased breath sounds at bases Abdomen: soft, non-distended, non-tender, normal bowel sounds Skin: No Rash Neurologic: Grossly no focal neuro deficit.Mental status AAOx3, speech normal, Psychiatric:Appropriate affect, and mood  Discharge Diagnoses:  Principal Problem:   CAP (community acquired pneumonia) Active Problems:   Essential hypertension   Acute respiratory failure with hypoxia (HCC)   Depression   HTN (hypertension)   Bradycardia   Severe sepsis with acute organ dysfunction (HCC)   Obesity (BMI 30-39.9)  Discharge Instructions  Discharge Instructions    Diet - low sodium heart healthy   Complete by: As directed    Increase activity slowly   Complete by: As directed      Allergies as of 12/29/2019   No Known Allergies      Medication List    TAKE these medications   albuterol 108 (90 Base) MCG/ACT inhaler Commonly known as: VENTOLIN HFA Inhale 2 puffs into the lungs every 6 (six) hours as needed for wheezing or shortness of breath.   amLODipine 5 MG tablet Commonly known as: NORVASC Take 2 tablets (10 mg total) by mouth daily. What changed: how much to take   atenolol 50 MG tablet Commonly known as: TENORMIN Take 50 mg by mouth daily.   buPROPion 75 MG tablet Commonly known as: WELLBUTRIN TAKE 1 TABLET AT BEDTIME What changed: how much to take   cefdinir 300 MG capsule Commonly known as: OMNICEF Take 1 capsule (300 mg total) by mouth every 12 (twelve) hours for 1 dose.   cloNIDine 0.1 MG tablet Commonly known as: CATAPRES Take 0.2 mg by mouth at bedtime.   furosemide 40 MG tablet Commonly known as: LASIX TAKE 1 TABLET BY MOUTH EVERY MORNING What changed:   how much to take  how to take this  when to take this   guaiFENesin 600 MG 12 hr tablet Commonly known as: MUCINEX Take 1 tablet (600 mg total) by mouth 2 (two) times daily as needed for up to 5 days for to loosen phlegm.   levothyroxine 50 MCG tablet Commonly known as: SYNTHROID Take 50 mcg by mouth daily before breakfast.   predniSONE 10 MG tablet Commonly known as: DELTASONE Take 40 mg daily x 2 days then 20 mg daily x 2 days then 10 mg daily  x 2 days   simvastatin 40 MG tablet Commonly known as: ZOCOR Take 40 mg by mouth daily.   topiramate 25 MG tablet Commonly known as: TOPAMAX Take 50 mg by mouth daily.   venlafaxine XR 150 MG 24 hr capsule Commonly known as: EFFEXOR-XR TAKE ONE CAPSULE BY MOUTH TWICE A DAY What changed:   how much to take  when to take this       No Known Allergies      The results of significant diagnostics from this hospitalization (including imaging, microbiology, ancillary and laboratory) are listed below for reference.     Microbiology: Recent Results (from the past 240  hour(s))  Respiratory Panel by RT PCR (Flu A&B, Covid) - Nasopharyngeal Swab     Status: None   Collection Time: 12/24/19  3:33 AM   Specimen: Nasopharyngeal Swab  Result Value Ref Range Status   SARS Coronavirus 2 by RT PCR NEGATIVE NEGATIVE Final    Comment: (NOTE) SARS-CoV-2 target nucleic acids are NOT DETECTED.  The SARS-CoV-2 RNA is generally detectable in upper respiratoy specimens during the acute phase of infection. The lowest concentration of SARS-CoV-2 viral copies this assay can detect is 131 copies/mL. A negative result does not preclude SARS-Cov-2 infection and should not be used as the sole basis for treatment or other patient management decisions. A negative result may occur with  improper specimen collection/handling, submission of specimen other than nasopharyngeal swab, presence of viral mutation(s) within the areas targeted by this assay, and inadequate number of viral copies (<131 copies/mL). A negative result must be combined with clinical observations, patient history, and epidemiological information. The expected result is Negative.  Fact Sheet for Patients:  PinkCheek.be  Fact Sheet for Healthcare Providers:  GravelBags.it  This test is no t yet approved or cleared by the [place] FDA and  has been authorized for detection and/or diagnosis of SARS-CoV-2 by FDA under an Emergency Use Authorization (EUA). This EUA will remain  in effect (meaning this test can be used) for the duration of the COVID-19 declaration under Section 564(b)(1) of the Act, 21 U.S.C. section 360bbb-3(b)(1), unless the authorization is terminated or revoked sooner.     Influenza A by PCR NEGATIVE NEGATIVE Final   Influenza B by PCR NEGATIVE NEGATIVE Final    Comment: (NOTE) The Xpert Xpress SARS-CoV-2/FLU/RSV assay is intended as an aid in  the diagnosis of influenza from Nasopharyngeal swab specimens and  should not  be used as a sole basis for treatment. Nasal washings and  aspirates are unacceptable for Xpert Xpress SARS-CoV-2/FLU/RSV  testing.  Fact Sheet for Patients: PinkCheek.be  Fact Sheet for Healthcare Providers: GravelBags.it  This test is not yet approved or cleared by the [place] FDA and  has been authorized for detection and/or diagnosis of SARS-CoV-2 by  FDA under an Emergency Use Authorization (EUA). This EUA will remain  in effect (meaning this test can be used) for the duration of the  Covid-19 declaration under Section 564(b)(1) of the Act, 21  U.S.C. section 360bbb-3(b)(1), unless the authorization is  terminated or revoked. Performed at [facility], [place] [address], [place], [place] [address]      Labs: BNP (last 3 results) Recent Labs    12/24/19 0333  BNP 502.7*   Basic Metabolic Panel: Recent Labs  Lab 12/24/19 0332 12/24/19 0332 12/24/19 1129 12/24/19 1607 12/25/19 0134 12/25/19 0752 12/26/19 0155 12/27/19 0439 12/29/19 0341  NA 141   < >  --  144 142  --  139 139 137  K 3.4*   < >  --  3.8 3.4*  --  3.2* 3.6 4.4  CL 105  --   --   --  104  --  104 108 108  CO2 23  --   --   --  26  --  25 26 26   GLUCOSE 159*  --   --   --  109*  --  144* 91 177*  BUN 13  --   --   --  24*  --  21 15 13   CREATININE 1.22*  --   --   --  1.30*  --  1.05* 1.05* 1.02*  CALCIUM 9.6  --   --   --  9.4  --  9.3 9.3 9.9  MG  --   --  1.4*  --   --  1.8 1.9  --   --    < > = values in this interval not displayed.   Liver Function Tests: Recent Labs  Lab 12/24/19 0332 12/25/19 0134  AST 24 27  ALT 25 23  ALKPHOS 87 64  BILITOT 0.8 0.5  PROT 7.1 6.1*  ALBUMIN 3.9 3.1*   No results for input(s): LIPASE, AMYLASE in the last 168 hours. No results for input(s): AMMONIA in the last 168 hours. CBC: Recent Labs  Lab 12/24/19 0332 12/24/19 1607 12/25/19 0134 12/26/19 0155 12/27/19 0439 12/28/19 0050  12/29/19 0341  WBC 24.3*  --  17.7* 10.4 8.3 8.3 15.0*  NEUTROABS 21.6*  --   --  7.9* 5.2 4.5  --   HGB 13.3   < > 11.8* 11.4* 11.2* 11.3* 12.5  HCT 42.2   < > 36.5 36.4 35.4* 35.8* 39.5  MCV 89.8  --  89.7 91.0 91.0 88.8 88.0  PLT 296  --  226 204 240 245 316   < > = values in this interval not displayed.   Cardiac Enzymes: No results for input(s): CKTOTAL, CKMB, CKMBINDEX, TROPONINI in the last 168 hours. BNP: Invalid input(s): POCBNP CBG: No results for input(s): GLUCAP in the last 168 hours. D-Dimer No results for input(s): DDIMER in the last 72 hours. Hgb A1c No results for input(s): HGBA1C in the last 72 hours. Lipid Profile No results for input(s): CHOL, HDL, LDLCALC, TRIG, CHOLHDL, LDLDIRECT in the last 72 hours. Thyroid function studies No results for input(s): TSH, T4TOTAL, T3FREE, THYROIDAB in the last 72 hours.  Invalid input(s): FREET3 Anemia work up No results for input(s): VITAMINB12, FOLATE, FERRITIN, TIBC, IRON, RETICCTPCT in the last 72 hours. Urinalysis    Component Value Date/Time   COLORURINE YELLOW 08/24/2013 1121   APPEARANCEUR CLEAR 08/24/2013 1121   LABSPEC 1.013 08/24/2013 1121   PHURINE 6.0 08/24/2013 1121   GLUCOSEU NEGATIVE 08/24/2013 1121   HGBUR NEGATIVE 08/24/2013 1121   HGBUR trace-intact 01/10/2010 0851   BILIRUBINUR NEGATIVE 08/24/2013 1121   BILIRUBINUR n 03/20/2011 1122   KETONESUR NEGATIVE 08/24/2013 1121   PROTEINUR NEGATIVE 08/24/2013 1121   UROBILINOGEN 1.0 08/24/2013 1121   NITRITE NEGATIVE 08/24/2013 1121   LEUKOCYTESUR NEGATIVE 08/24/2013 1121   Sepsis Labs Invalid input(s): PROCALCITONIN,  WBC,  LACTICIDVEN Microbiology Recent Results (from the past 240 hour(s))  Respiratory Panel by RT PCR (Flu A&B, Covid) - Nasopharyngeal Swab     Status: None   Collection Time: 12/24/19  3:33 AM   Specimen: Nasopharyngeal Swab  Result Value Ref Range Status   SARS Coronavirus 2 by  RT PCR NEGATIVE NEGATIVE Final    Comment:  (NOTE) SARS-CoV-2 target nucleic acids are NOT DETECTED.  The SARS-CoV-2 RNA is generally detectable in upper respiratoy specimens during the acute phase of infection. The lowest concentration of SARS-CoV-2 viral copies this assay can detect is 131 copies/mL. A negative result does not preclude SARS-Cov-2 infection and should not be used as the sole basis for treatment or other patient management decisions. A negative result may occur with  improper specimen collection/handling, submission of specimen other than nasopharyngeal swab, presence of viral mutation(s) within the areas targeted by this assay, and inadequate number of viral copies (<131 copies/mL). A negative result must be combined with clinical observations, patient history, and epidemiological information. The expected result is Negative.  Fact Sheet for Patients:  PinkCheek.be  Fact Sheet for Healthcare Providers:  GravelBags.it  This test is no t yet approved or cleared by the [place] FDA and  has been authorized for detection and/or diagnosis of SARS-CoV-2 by FDA under an Emergency Use Authorization (EUA). This EUA will remain  in effect (meaning this test can be used) for the duration of the COVID-19 declaration under Section 564(b)(1) of the Act, 21 U.S.C. section 360bbb-3(b)(1), unless the authorization is terminated or revoked sooner.     Influenza A by PCR NEGATIVE NEGATIVE Final   Influenza B by PCR NEGATIVE NEGATIVE Final    Comment: (NOTE) The Xpert Xpress SARS-CoV-2/FLU/RSV assay is intended as an aid in  the diagnosis of influenza from Nasopharyngeal swab specimens and  should not be used as a sole basis for treatment. Nasal washings and  aspirates are unacceptable for Xpert Xpress SARS-CoV-2/FLU/RSV  testing.  Fact Sheet for Patients: PinkCheek.be  Fact Sheet for Healthcare  Providers: GravelBags.it  This test is not yet approved or cleared by the [place] FDA and  has been authorized for detection and/or diagnosis of SARS-CoV-2 by  FDA under an Emergency Use Authorization (EUA). This EUA will remain  in effect (meaning this test can be used) for the duration of the  Covid-19 declaration under Section 564(b)(1) of the Act, 21  U.S.C. section 360bbb-3(b)(1), unless the authorization is  terminated or revoked. Performed at [facility], [place] [address] [place], [place] [address]      Time coordinating discharge: Over 30 minutes  SIGNED:   Desiree Hane, MD  [facility] 12/29/2019, 6:59 PM Pager   If 7PM-7AM, please contact night-coverage [email or web address] Password [email or web address]

## 2019-12-29 NOTE — Discharge Instructions (Signed)
For your breathing Take Prednisone 40 mg daily x 2 days then 20 mg daily x 2 days then 10 mg daily x 2 days  I increased your amlodipine to 10 mg from 5 mg to help with your blood pressure while on the prednisone  You have one more dose of cefdenir to take tonight  You also have an albuterol inhaler prescribed that you can use as needed  You can also use the mucinex as needed to loosen any phlegm.

## 2019-12-30 LAB — LEGIONELLA PNEUMOPHILA SEROGP 1 UR AG: L. pneumophila Serogp 1 Ur Ag: NEGATIVE

## 2020-01-06 DIAGNOSIS — J189 Pneumonia, unspecified organism: Secondary | ICD-10-CM | POA: Diagnosis not present

## 2020-01-06 DIAGNOSIS — I10 Essential (primary) hypertension: Secondary | ICD-10-CM | POA: Diagnosis not present

## 2020-01-14 ENCOUNTER — Encounter: Payer: Medicare Other | Admitting: Gastroenterology

## 2020-02-23 ENCOUNTER — Other Ambulatory Visit: Payer: Self-pay

## 2020-02-23 ENCOUNTER — Ambulatory Visit (AMBULATORY_SURGERY_CENTER): Payer: Self-pay | Admitting: *Deleted

## 2020-02-23 VITALS — Ht 64.0 in | Wt 212.0 lb

## 2020-02-23 DIAGNOSIS — Z800 Family history of malignant neoplasm of digestive organs: Secondary | ICD-10-CM

## 2020-02-23 NOTE — Progress Notes (Signed)
Patient is here in-person for PV. Patient denies any allergies to eggs or soy. Patient denies any problems with anesthesia/sedation. Patient denies any oxygen use at home. Patient denies taking any diet/weight loss medications or blood thinners. Patient is not being treated for MRSA or C-diff. Patient is aware of our care-partner policy and ATFTD-32 safety protocol.    COVID-19 vaccines completed x2 and she will get booster on 1/11, per patient.   Patient has suprep kit at home. She will take miralax daily.

## 2020-02-24 DIAGNOSIS — Z23 Encounter for immunization: Secondary | ICD-10-CM | POA: Diagnosis not present

## 2020-03-08 ENCOUNTER — Other Ambulatory Visit: Payer: Self-pay

## 2020-03-08 ENCOUNTER — Encounter: Payer: Self-pay | Admitting: Gastroenterology

## 2020-03-08 ENCOUNTER — Ambulatory Visit (AMBULATORY_SURGERY_CENTER): Payer: Medicare Other | Admitting: Gastroenterology

## 2020-03-08 VITALS — BP 143/66 | HR 67 | Temp 96.8°F | Resp 12 | Ht 64.0 in | Wt 212.0 lb

## 2020-03-08 DIAGNOSIS — Z800 Family history of malignant neoplasm of digestive organs: Secondary | ICD-10-CM | POA: Diagnosis not present

## 2020-03-08 DIAGNOSIS — Z1211 Encounter for screening for malignant neoplasm of colon: Secondary | ICD-10-CM | POA: Diagnosis not present

## 2020-03-08 MED ORDER — SODIUM CHLORIDE 0.9 % IV SOLN: 500.0000 mL | INTRAVENOUS | Status: DC

## 2020-03-08 NOTE — Patient Instructions (Addendum)
Handouts were given to you on diverticulosis, hemorrhoids, and a high fiber diet with liberal fluid intake. You may resume your current medications today. Repeat colonoscopy in 5 years for screening purposes. Please call if any questions or concerns.   YOU HAD AN ENDOSCOPIC PROCEDURE TODAY AT [facility]:   Refer to the procedure report that was given to you for any specific questions about what was found during the examination.  If the procedure report does not answer your questions, please call your gastroenterologist to clarify.  If you requested that your care partner not be given the details of your procedure findings, then the procedure report has been included in a sealed envelope for you to review at your convenience later.  YOU SHOULD EXPECT: Some feelings of bloating in the abdomen. Passage of more gas than usual.  Walking can help get rid of the air that was put into your GI tract during the procedure and reduce the bloating. If you had a lower endoscopy (such as a colonoscopy or flexible sigmoidoscopy) you may notice spotting of blood in your stool or on the toilet paper. If you underwent a bowel prep for your procedure, you may not have a normal bowel movement for a few days.  Please Note:  You might notice some irritation and congestion in your nose or some drainage.  This is from the oxygen used during your procedure.  There is no need for concern and it should clear up in a day or so.  SYMPTOMS TO REPORT IMMEDIATELY:   Following lower endoscopy (colonoscopy or flexible sigmoidoscopy):  Excessive amounts of blood in the stool  Significant tenderness or worsening of abdominal pains  Swelling of the abdomen that is new, acute  Fever of 100F or higher   For urgent or emergent issues, a gastroenterologist can be reached at any hour by calling [phone]. Do not use MyChart messaging for urgent concerns.    DIET:  We do recommend a small meal at first, but  then you may proceed to your regular diet.  Drink plenty of fluids but you should avoid alcoholic beverages for 24 hours.  ACTIVITY:  You should plan to take it easy for the rest of today and you should NOT DRIVE or use heavy machinery until tomorrow (because of the sedation medicines used during the test).    FOLLOW UP: Our staff will call the number listed on your records 48-72 hours following your procedure to check on you and address any questions or concerns that you may have regarding the information given to you following your procedure. If we do not reach you, we will leave a message.  We will attempt to reach you two times.  During this call, we will ask if you have developed any symptoms of COVID 19. If you develop any symptoms (ie: fever, flu-like symptoms, shortness of breath, cough etc.) before then, please call [phone].  If you test positive for Covid 19 in the 2 weeks post procedure, please call and report this information to [place].    If any biopsies were taken you will be contacted by phone or by letter within the next 1-3 weeks.  Please call us at [phone] if you have not heard about the biopsies in 3 weeks.    SIGNATURES/CONFIDENTIALITY: You and/or your care partner have signed paperwork which will be entered into your electronic medical record.  These signatures attest to the fact that that the information above on your After Visit  Summary has been reviewed and is understood.  Full responsibility of the confidentiality of this discharge information lies with you and/or your care-partner.

## 2020-03-08 NOTE — Op Note (Signed)
[facility] Patient Name: Sierra Banks Procedure Date: 03/08/2020 10:54 AM MRN: [id number] Endoscopist: Ladene Artist , MD Age: 70 Referring MD:  Date of Birth: 1950/07/31 Gender: Female Account #: [id number] Procedure:                Colonoscopy Indications:              Colon cancer screening in patient at increased                            risk: Family history of colorectal cancer in                            multiple 2nd degree relatives Medicines:                Monitored Anesthesia Care Procedure:                Pre-Anesthesia Assessment:                           - Prior to the procedure, a History and Physical                            was performed, and patient medications and                            allergies were reviewed. The patient's tolerance of                            previous anesthesia was also reviewed. The risks                            and benefits of the procedure and the sedation                            options and risks were discussed with the patient.                            All questions were answered, and informed consent                            was obtained. Prior Anticoagulants: The patient has                            taken no previous anticoagulant or antiplatelet                            agents. ASA Grade Assessment: III - A patient with                            severe systemic disease. After reviewing the risks                            and benefits, the patient was deemed in  satisfactory condition to undergo the procedure.                           After obtaining informed consent, the colonoscope                            was passed under direct vision. Throughout the                            procedure, the patient's blood pressure, pulse, and                            oxygen saturations were monitored continuously. The                            Olympus CF-[id number])  Colonoscope was                            introduced through the anus and advanced to the the                            cecum, identified by appendiceal orifice and                            ileocecal valve. The ileocecal valve, appendiceal                            orifice, and rectum were photographed. The quality                            of the bowel preparation was adequate. The                            colonoscopy was somewhat difficult due to a                            redundant colon and a tortuous colon. The patient                            tolerated the procedure well. Scope In: 10:59:51 AM Scope Out: 11:25:11 AM Scope Withdrawal Time: 0 hours 16 minutes 38 seconds  Total Procedure Duration: 0 hours 25 minutes 20 seconds  Findings:                 The perianal and digital rectal examinations were                            normal.                           Multiple small-mouthed diverticula were found in                            the left colon. There was evidence of diverticular  spasm.                           Internal hemorrhoids were found during                            retroflexion. The hemorrhoids were small and Grade                            I (internal hemorrhoids that do not prolapse).                           The exam was otherwise without abnormality on                            direct and retroflexion views. Complications:            No immediate complications. Estimated blood loss:                            None. Estimated Blood Loss:     Estimated blood loss: none. Impression:               - Moderate diverticulosis in the left colon.                           - Internal hemorrhoids.                           - The examination was otherwise normal on direct                            and retroflexion views.                           - No specimens collected. Recommendation:           - Repeat colonoscopy in 5 years  for screening                            purposes.                           - Patient has a contact number available for                            emergencies. The signs and symptoms of potential                            delayed complications were discussed with the                            patient. Return to normal activities tomorrow.                            Written discharge instructions were provided to the  patient.                           - High fiber diet.                           - Continue present medications. Ladene Artist, MD 03/08/2020 11:33:47 AM This report has been signed electronically.

## 2020-03-08 NOTE — Progress Notes (Signed)
To PACU, VSS. Report to [email or web address] 

## 2020-03-08 NOTE — Progress Notes (Signed)
No problems noted in the recovery room. maw 

## 2020-03-10 ENCOUNTER — Telehealth: Payer: Self-pay

## 2020-03-10 NOTE — Telephone Encounter (Signed)
No answer, left message to call back later today, B.Able Malloy RN. 

## 2020-03-10 NOTE — Telephone Encounter (Signed)
No answer, left message to call if having any issues or concerns, B.Khyler Urda RN 

## 2020-08-20 DIAGNOSIS — Z20822 Contact with and (suspected) exposure to covid-19: Secondary | ICD-10-CM | POA: Diagnosis not present

## 2020-10-15 DIAGNOSIS — J309 Allergic rhinitis, unspecified: Secondary | ICD-10-CM | POA: Diagnosis not present

## 2020-10-15 DIAGNOSIS — E669 Obesity, unspecified: Secondary | ICD-10-CM | POA: Diagnosis not present

## 2020-10-15 DIAGNOSIS — E7800 Pure hypercholesterolemia, unspecified: Secondary | ICD-10-CM | POA: Diagnosis not present

## 2020-10-15 DIAGNOSIS — I10 Essential (primary) hypertension: Secondary | ICD-10-CM | POA: Diagnosis not present

## 2020-10-15 DIAGNOSIS — Z1231 Encounter for screening mammogram for malignant neoplasm of breast: Secondary | ICD-10-CM | POA: Diagnosis not present

## 2020-10-15 DIAGNOSIS — F339 Major depressive disorder, recurrent, unspecified: Secondary | ICD-10-CM | POA: Diagnosis not present

## 2020-10-15 DIAGNOSIS — L858 Other specified epidermal thickening: Secondary | ICD-10-CM | POA: Diagnosis not present

## 2020-10-15 DIAGNOSIS — E039 Hypothyroidism, unspecified: Secondary | ICD-10-CM | POA: Diagnosis not present

## 2020-12-23 DIAGNOSIS — Z20828 Contact with and (suspected) exposure to other viral communicable diseases: Secondary | ICD-10-CM | POA: Diagnosis not present

## 2020-12-28 DIAGNOSIS — Z6836 Body mass index (BMI) 36.0-36.9, adult: Secondary | ICD-10-CM | POA: Diagnosis not present

## 2020-12-28 DIAGNOSIS — R300 Dysuria: Secondary | ICD-10-CM | POA: Diagnosis not present

## 2020-12-28 DIAGNOSIS — Z124 Encounter for screening for malignant neoplasm of cervix: Secondary | ICD-10-CM | POA: Diagnosis not present

## 2020-12-31 DIAGNOSIS — E039 Hypothyroidism, unspecified: Secondary | ICD-10-CM | POA: Diagnosis not present

## 2021-01-04 DIAGNOSIS — L298 Other pruritus: Secondary | ICD-10-CM | POA: Diagnosis not present

## 2021-01-04 DIAGNOSIS — L821 Other seborrheic keratosis: Secondary | ICD-10-CM | POA: Diagnosis not present

## 2021-01-04 DIAGNOSIS — L853 Xerosis cutis: Secondary | ICD-10-CM | POA: Diagnosis not present

## 2021-03-14 DIAGNOSIS — H6993 Unspecified Eustachian tube disorder, bilateral: Secondary | ICD-10-CM | POA: Diagnosis not present

## 2021-03-14 DIAGNOSIS — E7800 Pure hypercholesterolemia, unspecified: Secondary | ICD-10-CM | POA: Diagnosis not present

## 2021-03-14 DIAGNOSIS — I10 Essential (primary) hypertension: Secondary | ICD-10-CM | POA: Diagnosis not present

## 2021-03-14 DIAGNOSIS — F339 Major depressive disorder, recurrent, unspecified: Secondary | ICD-10-CM | POA: Diagnosis not present

## 2021-03-14 DIAGNOSIS — E039 Hypothyroidism, unspecified: Secondary | ICD-10-CM | POA: Diagnosis not present

## 2021-03-30 DIAGNOSIS — I10 Essential (primary) hypertension: Secondary | ICD-10-CM | POA: Diagnosis not present

## 2021-03-30 DIAGNOSIS — R229 Localized swelling, mass and lump, unspecified: Secondary | ICD-10-CM | POA: Diagnosis not present

## 2021-04-07 DIAGNOSIS — A630 Anogenital (venereal) warts: Secondary | ICD-10-CM | POA: Diagnosis not present

## 2021-04-07 DIAGNOSIS — R350 Frequency of micturition: Secondary | ICD-10-CM | POA: Diagnosis not present

## 2021-04-13 DIAGNOSIS — Z20822 Contact with and (suspected) exposure to covid-19: Secondary | ICD-10-CM | POA: Diagnosis not present

## 2021-04-20 DIAGNOSIS — H2513 Age-related nuclear cataract, bilateral: Secondary | ICD-10-CM | POA: Diagnosis not present

## 2021-04-20 DIAGNOSIS — H353121 Nonexudative age-related macular degeneration, left eye, early dry stage: Secondary | ICD-10-CM | POA: Diagnosis not present

## 2021-04-20 DIAGNOSIS — H0102B Squamous blepharitis left eye, upper and lower eyelids: Secondary | ICD-10-CM | POA: Diagnosis not present

## 2021-04-20 DIAGNOSIS — H0102A Squamous blepharitis right eye, upper and lower eyelids: Secondary | ICD-10-CM | POA: Diagnosis not present

## 2021-05-17 DIAGNOSIS — I10 Essential (primary) hypertension: Secondary | ICD-10-CM | POA: Diagnosis not present

## 2021-05-17 DIAGNOSIS — Z0001 Encounter for general adult medical examination with abnormal findings: Secondary | ICD-10-CM | POA: Diagnosis not present

## 2021-05-17 DIAGNOSIS — Z1389 Encounter for screening for other disorder: Secondary | ICD-10-CM | POA: Diagnosis not present

## 2021-05-17 DIAGNOSIS — F5109 Other insomnia not due to a substance or known physiological condition: Secondary | ICD-10-CM | POA: Diagnosis not present

## 2021-05-17 DIAGNOSIS — F322 Major depressive disorder, single episode, severe without psychotic features: Secondary | ICD-10-CM | POA: Diagnosis not present

## 2021-05-17 DIAGNOSIS — Z0000 Encounter for general adult medical examination without abnormal findings: Secondary | ICD-10-CM | POA: Diagnosis not present

## 2021-05-17 DIAGNOSIS — H6991 Unspecified Eustachian tube disorder, right ear: Secondary | ICD-10-CM | POA: Diagnosis not present

## 2021-05-17 DIAGNOSIS — E039 Hypothyroidism, unspecified: Secondary | ICD-10-CM | POA: Diagnosis not present

## 2021-05-17 DIAGNOSIS — Z79899 Other long term (current) drug therapy: Secondary | ICD-10-CM | POA: Diagnosis not present

## 2021-05-17 DIAGNOSIS — R058 Other specified cough: Secondary | ICD-10-CM | POA: Diagnosis not present

## 2021-05-31 DIAGNOSIS — Z20822 Contact with and (suspected) exposure to covid-19: Secondary | ICD-10-CM | POA: Diagnosis not present

## 2021-08-13 DIAGNOSIS — R2231 Localized swelling, mass and lump, right upper limb: Secondary | ICD-10-CM | POA: Diagnosis not present

## 2021-08-13 DIAGNOSIS — I10 Essential (primary) hypertension: Secondary | ICD-10-CM | POA: Diagnosis not present

## 2021-08-13 DIAGNOSIS — M25521 Pain in right elbow: Secondary | ICD-10-CM | POA: Diagnosis not present

## 2021-08-25 DIAGNOSIS — H0102B Squamous blepharitis left eye, upper and lower eyelids: Secondary | ICD-10-CM | POA: Diagnosis not present

## 2021-08-25 DIAGNOSIS — H02831 Dermatochalasis of right upper eyelid: Secondary | ICD-10-CM | POA: Diagnosis not present

## 2021-08-25 DIAGNOSIS — H02834 Dermatochalasis of left upper eyelid: Secondary | ICD-10-CM | POA: Diagnosis not present

## 2021-08-25 DIAGNOSIS — H0102A Squamous blepharitis right eye, upper and lower eyelids: Secondary | ICD-10-CM | POA: Diagnosis not present

## 2021-09-27 DIAGNOSIS — Z79899 Other long term (current) drug therapy: Secondary | ICD-10-CM | POA: Diagnosis not present

## 2021-09-27 DIAGNOSIS — I10 Essential (primary) hypertension: Secondary | ICD-10-CM | POA: Diagnosis not present

## 2021-09-27 DIAGNOSIS — E039 Hypothyroidism, unspecified: Secondary | ICD-10-CM | POA: Diagnosis not present

## 2021-09-27 DIAGNOSIS — L659 Nonscarring hair loss, unspecified: Secondary | ICD-10-CM | POA: Diagnosis not present

## 2021-09-27 DIAGNOSIS — F5109 Other insomnia not due to a substance or known physiological condition: Secondary | ICD-10-CM | POA: Diagnosis not present

## 2021-09-27 DIAGNOSIS — R2681 Unsteadiness on feet: Secondary | ICD-10-CM | POA: Diagnosis not present

## 2021-09-27 DIAGNOSIS — H6991 Unspecified Eustachian tube disorder, right ear: Secondary | ICD-10-CM | POA: Diagnosis not present

## 2021-09-27 DIAGNOSIS — F322 Major depressive disorder, single episode, severe without psychotic features: Secondary | ICD-10-CM | POA: Diagnosis not present

## 2021-09-27 DIAGNOSIS — R3915 Urgency of urination: Secondary | ICD-10-CM | POA: Diagnosis not present

## 2021-09-27 DIAGNOSIS — R058 Other specified cough: Secondary | ICD-10-CM | POA: Diagnosis not present

## 2021-10-03 DIAGNOSIS — A630 Anogenital (venereal) warts: Secondary | ICD-10-CM | POA: Diagnosis not present

## 2021-10-03 DIAGNOSIS — H903 Sensorineural hearing loss, bilateral: Secondary | ICD-10-CM | POA: Diagnosis not present

## 2021-10-03 DIAGNOSIS — H838X3 Other specified diseases of inner ear, bilateral: Secondary | ICD-10-CM | POA: Diagnosis not present

## 2021-10-03 DIAGNOSIS — H6982 Other specified disorders of Eustachian tube, left ear: Secondary | ICD-10-CM | POA: Diagnosis not present

## 2021-10-05 DIAGNOSIS — H6522 Chronic serous otitis media, left ear: Secondary | ICD-10-CM | POA: Diagnosis not present

## 2021-10-05 DIAGNOSIS — H6982 Other specified disorders of Eustachian tube, left ear: Secondary | ICD-10-CM | POA: Diagnosis not present

## 2021-10-10 DIAGNOSIS — R350 Frequency of micturition: Secondary | ICD-10-CM | POA: Diagnosis not present

## 2021-10-24 DIAGNOSIS — N390 Urinary tract infection, site not specified: Secondary | ICD-10-CM | POA: Diagnosis not present

## 2021-10-25 DIAGNOSIS — N644 Mastodynia: Secondary | ICD-10-CM | POA: Diagnosis not present

## 2021-11-03 DIAGNOSIS — H02831 Dermatochalasis of right upper eyelid: Secondary | ICD-10-CM | POA: Diagnosis not present

## 2021-11-03 DIAGNOSIS — H57813 Brow ptosis, bilateral: Secondary | ICD-10-CM | POA: Diagnosis not present

## 2021-11-03 DIAGNOSIS — H0289 Other specified disorders of eyelid: Secondary | ICD-10-CM | POA: Diagnosis not present

## 2021-11-03 DIAGNOSIS — H02412 Mechanical ptosis of left eyelid: Secondary | ICD-10-CM | POA: Diagnosis not present

## 2021-11-03 DIAGNOSIS — H0279 Other degenerative disorders of eyelid and periocular area: Secondary | ICD-10-CM | POA: Diagnosis not present

## 2021-11-03 DIAGNOSIS — H02411 Mechanical ptosis of right eyelid: Secondary | ICD-10-CM | POA: Diagnosis not present

## 2021-11-03 DIAGNOSIS — H02413 Mechanical ptosis of bilateral eyelids: Secondary | ICD-10-CM | POA: Diagnosis not present

## 2021-11-03 DIAGNOSIS — H02834 Dermatochalasis of left upper eyelid: Secondary | ICD-10-CM | POA: Diagnosis not present

## 2021-11-04 DIAGNOSIS — H838X3 Other specified diseases of inner ear, bilateral: Secondary | ICD-10-CM | POA: Diagnosis not present

## 2021-11-04 DIAGNOSIS — H6982 Other specified disorders of Eustachian tube, left ear: Secondary | ICD-10-CM | POA: Diagnosis not present

## 2021-11-04 DIAGNOSIS — H903 Sensorineural hearing loss, bilateral: Secondary | ICD-10-CM | POA: Diagnosis not present

## 2021-11-04 DIAGNOSIS — H7202 Central perforation of tympanic membrane, left ear: Secondary | ICD-10-CM | POA: Diagnosis not present

## 2021-11-09 DIAGNOSIS — E039 Hypothyroidism, unspecified: Secondary | ICD-10-CM | POA: Diagnosis not present

## 2021-11-11 DIAGNOSIS — R2681 Unsteadiness on feet: Secondary | ICD-10-CM | POA: Diagnosis not present

## 2021-11-11 DIAGNOSIS — E039 Hypothyroidism, unspecified: Secondary | ICD-10-CM | POA: Diagnosis not present

## 2021-11-11 DIAGNOSIS — H6991 Unspecified Eustachian tube disorder, right ear: Secondary | ICD-10-CM | POA: Diagnosis not present

## 2021-11-11 DIAGNOSIS — F322 Major depressive disorder, single episode, severe without psychotic features: Secondary | ICD-10-CM | POA: Diagnosis not present

## 2021-11-11 DIAGNOSIS — F5109 Other insomnia not due to a substance or known physiological condition: Secondary | ICD-10-CM | POA: Diagnosis not present

## 2021-11-11 DIAGNOSIS — L659 Nonscarring hair loss, unspecified: Secondary | ICD-10-CM | POA: Diagnosis not present

## 2021-11-11 DIAGNOSIS — R058 Other specified cough: Secondary | ICD-10-CM | POA: Diagnosis not present

## 2021-11-11 DIAGNOSIS — I10 Essential (primary) hypertension: Secondary | ICD-10-CM | POA: Diagnosis not present

## 2021-11-11 DIAGNOSIS — Z79899 Other long term (current) drug therapy: Secondary | ICD-10-CM | POA: Diagnosis not present

## 2021-11-11 DIAGNOSIS — R3915 Urgency of urination: Secondary | ICD-10-CM | POA: Diagnosis not present

## 2021-11-11 DIAGNOSIS — Z23 Encounter for immunization: Secondary | ICD-10-CM | POA: Diagnosis not present

## 2021-11-30 DIAGNOSIS — M5116 Intervertebral disc disorders with radiculopathy, lumbar region: Secondary | ICD-10-CM | POA: Diagnosis not present

## 2021-11-30 DIAGNOSIS — M5416 Radiculopathy, lumbar region: Secondary | ICD-10-CM | POA: Diagnosis not present

## 2022-01-11 ENCOUNTER — Ambulatory Visit: Payer: Medicare Other | Admitting: Gastroenterology

## 2022-01-18 ENCOUNTER — Ambulatory Visit (INDEPENDENT_AMBULATORY_CARE_PROVIDER_SITE_OTHER): Payer: Medicare Other | Admitting: Gastroenterology

## 2022-01-18 ENCOUNTER — Encounter: Payer: Self-pay | Admitting: Gastroenterology

## 2022-01-18 VITALS — BP 130/80 | HR 107 | Ht 64.0 in | Wt 220.4 lb

## 2022-01-18 DIAGNOSIS — Z800 Family history of malignant neoplasm of digestive organs: Secondary | ICD-10-CM

## 2022-01-18 DIAGNOSIS — K219 Gastro-esophageal reflux disease without esophagitis: Secondary | ICD-10-CM

## 2022-01-18 DIAGNOSIS — K580 Irritable bowel syndrome with diarrhea: Secondary | ICD-10-CM | POA: Diagnosis not present

## 2022-01-18 MED ORDER — DICYCLOMINE HCL 20 MG PO TABS: 20.0000 mg | ORAL_TABLET | Freq: Three times a day (TID) | ORAL | 11 refills | Status: DC

## 2022-01-18 NOTE — Patient Instructions (Signed)
We have sent the following medications to your pharmacy for you to pick up at your convenience: dicyclomine.   The [facility] providers would like to encourage you to use [facility] to communicate with providers for non-urgent requests or questions.  Due to long hold times on the telephone, sending your provider a message by [facility] may be a faster and more efficient way to get a response.  Please allow 48 business hours for a response.  Please remember that this is for non-urgent requests.   Thank you for choosing me and [facility].  Pricilla Riffle. Dagoberto Ligas., MD., Marval Regal

## 2022-01-18 NOTE — Progress Notes (Signed)
    Assessment     IBS-D GERD Family history of colon cancer, multiple second-degree relatives   Recommendations    Continue dicyclomine 20 mg p.o. 3 times daily AC as needed Avoid foods and beverages that exacerbate symptoms Continue famotidine 20 mg daily as needed and follow antireflux measures Colonoscopy recommended in January 2027   HPI    This is a 71 year old female with IBS and GERD.  Her IBS is intermittently active and symptoms are controlled with dicyclomine 20 mg p.o. 3 times daily AC.  She states she generally eats 1 large meal per day and takes dicyclomine only before that meal.  Smaller meals have not been causing difficulties.  She has infrequent episodes of heartburn and is taking over-the-counter famotidine 20 mg daily as needed.  No other gastrointestinal complaints.   Labs / Imaging       Latest Ref Rng & Units 12/25/2019    1:34 AM 12/24/2019    3:32 AM 05/09/2018    1:28 PM  Hepatic Function  Total Protein 6.5 - 8.1 g/dL 6.1  7.1  7.1   Albumin 3.5 - 5.0 g/dL 3.1  3.9  4.2   AST 15 - 41 U/L _0 ALT 0 - 44 U/L 23  25  33   Alk Phosphatase 38 - 126 U/L 64  87  93   Total Bilirubin 0.3 - 1.2 mg/dL 0.5  0.8  0.5        Latest Ref Rng & Units 12/29/2019    3:41 AM 12/28/2019   12:50 AM 12/27/2019    4:39 AM  CBC  WBC 4.0 - 10.5 K/uL 15.0  8.3  8.3   Hemoglobin 12.0 - 15.0 g/dL 12.5  11.3  11.2   Hematocrit 36.0 - 46.0 % 39.5  35.8  35.4   Platelets 150 - 400 K/uL 316  245  240     Current Medications, Allergies, Past Medical History, Past Surgical History, Family History and Social History were reviewed in [employer] record.   Physical Exam: General: Well developed, well nourished, no acute distress Head: Normocephalic and atraumatic Eyes: Sclerae anicteric, EOMI Ears: Normal auditory acuity Mouth: No deformities or lesions noted Lungs: Clear throughout to auscultation Heart: Regular rate and rhythm; No  murmurs, rubs or bruits Abdomen: Soft, non tender and non distended. No masses, hepatosplenomegaly or hernias noted. Normal Bowel sounds Rectal: Not done Musculoskeletal: Symmetrical with no gross deformities  Pulses:  Normal pulses noted Extremities: No edema or deformities noted Neurological: Alert oriented x 4, grossly nonfocal Psychological:  Alert and cooperative. Normal mood and affect   Willadene Mounsey T. Fuller Plan, MD 01/18/2022, 11:16 AM

## 2022-02-01 DIAGNOSIS — F339 Major depressive disorder, recurrent, unspecified: Secondary | ICD-10-CM | POA: Diagnosis not present

## 2022-02-01 DIAGNOSIS — E039 Hypothyroidism, unspecified: Secondary | ICD-10-CM | POA: Diagnosis not present

## 2022-02-01 DIAGNOSIS — I10 Essential (primary) hypertension: Secondary | ICD-10-CM | POA: Diagnosis not present

## 2022-02-01 DIAGNOSIS — L659 Nonscarring hair loss, unspecified: Secondary | ICD-10-CM | POA: Diagnosis not present

## 2022-02-01 DIAGNOSIS — E7800 Pure hypercholesterolemia, unspecified: Secondary | ICD-10-CM | POA: Diagnosis not present

## 2022-03-29 DIAGNOSIS — E039 Hypothyroidism, unspecified: Secondary | ICD-10-CM | POA: Diagnosis not present

## 2022-03-29 DIAGNOSIS — N183 Chronic kidney disease, stage 3 unspecified: Secondary | ICD-10-CM | POA: Diagnosis not present

## 2022-03-29 DIAGNOSIS — I129 Hypertensive chronic kidney disease with stage 1 through stage 4 chronic kidney disease, or unspecified chronic kidney disease: Secondary | ICD-10-CM | POA: Diagnosis not present

## 2022-03-29 DIAGNOSIS — F339 Major depressive disorder, recurrent, unspecified: Secondary | ICD-10-CM | POA: Diagnosis not present

## 2022-05-10 DIAGNOSIS — M85852 Other specified disorders of bone density and structure, left thigh: Secondary | ICD-10-CM | POA: Diagnosis not present

## 2022-05-10 DIAGNOSIS — Z780 Asymptomatic menopausal state: Secondary | ICD-10-CM | POA: Diagnosis not present

## 2022-06-02 ENCOUNTER — Other Ambulatory Visit (HOSPITAL_BASED_OUTPATIENT_CLINIC_OR_DEPARTMENT_OTHER): Payer: Self-pay

## 2022-06-02 MED ORDER — WEGOVY 1 MG/0.5ML ~~LOC~~ SOAJ
1.0000 mg | SUBCUTANEOUS | 0 refills | Status: DC
Start: 2022-06-02 — End: 2023-12-18

## 2022-06-02 MED ORDER — WEGOVY 0.5 MG/0.5ML ~~LOC~~ SOAJ
0.5000 mg | SUBCUTANEOUS | 0 refills | Status: DC
  Filled 2022-06-02 (×2): qty 2, 28d supply, fill #0

## 2022-06-05 DIAGNOSIS — H52223 Regular astigmatism, bilateral: Secondary | ICD-10-CM | POA: Diagnosis not present

## 2022-06-05 DIAGNOSIS — H02413 Mechanical ptosis of bilateral eyelids: Secondary | ICD-10-CM | POA: Diagnosis not present

## 2022-06-05 DIAGNOSIS — H2513 Age-related nuclear cataract, bilateral: Secondary | ICD-10-CM | POA: Diagnosis not present

## 2022-06-05 DIAGNOSIS — H5213 Myopia, bilateral: Secondary | ICD-10-CM | POA: Diagnosis not present

## 2022-06-05 DIAGNOSIS — H353121 Nonexudative age-related macular degeneration, left eye, early dry stage: Secondary | ICD-10-CM | POA: Diagnosis not present

## 2022-06-05 DIAGNOSIS — H524 Presbyopia: Secondary | ICD-10-CM | POA: Diagnosis not present

## 2022-06-12 DIAGNOSIS — E213 Hyperparathyroidism, unspecified: Secondary | ICD-10-CM | POA: Diagnosis not present

## 2022-06-12 DIAGNOSIS — E210 Primary hyperparathyroidism: Secondary | ICD-10-CM | POA: Diagnosis not present

## 2022-06-12 DIAGNOSIS — Z87442 Personal history of urinary calculi: Secondary | ICD-10-CM | POA: Diagnosis not present

## 2022-06-12 DIAGNOSIS — I129 Hypertensive chronic kidney disease with stage 1 through stage 4 chronic kidney disease, or unspecified chronic kidney disease: Secondary | ICD-10-CM | POA: Diagnosis not present

## 2022-06-12 DIAGNOSIS — N1831 Chronic kidney disease, stage 3a: Secondary | ICD-10-CM | POA: Diagnosis not present

## 2022-06-29 ENCOUNTER — Other Ambulatory Visit (HOSPITAL_BASED_OUTPATIENT_CLINIC_OR_DEPARTMENT_OTHER): Payer: Self-pay

## 2022-06-29 MED ORDER — WEGOVY 1 MG/0.5ML ~~LOC~~ SOAJ
1.0000 mg | SUBCUTANEOUS | 0 refills | Status: DC
Start: 2022-06-29 — End: 2023-12-18
  Filled 2022-06-29: qty 2, 28d supply, fill #0

## 2022-07-12 DIAGNOSIS — Z0000 Encounter for general adult medical examination without abnormal findings: Secondary | ICD-10-CM | POA: Diagnosis not present

## 2022-07-12 DIAGNOSIS — M7989 Other specified soft tissue disorders: Secondary | ICD-10-CM | POA: Diagnosis not present

## 2022-07-12 DIAGNOSIS — I129 Hypertensive chronic kidney disease with stage 1 through stage 4 chronic kidney disease, or unspecified chronic kidney disease: Secondary | ICD-10-CM | POA: Diagnosis not present

## 2022-07-12 DIAGNOSIS — Z8639 Personal history of other endocrine, nutritional and metabolic disease: Secondary | ICD-10-CM | POA: Diagnosis not present

## 2022-07-12 DIAGNOSIS — Z1159 Encounter for screening for other viral diseases: Secondary | ICD-10-CM | POA: Diagnosis not present

## 2022-07-12 DIAGNOSIS — F339 Major depressive disorder, recurrent, unspecified: Secondary | ICD-10-CM | POA: Diagnosis not present

## 2022-07-12 DIAGNOSIS — K5900 Constipation, unspecified: Secondary | ICD-10-CM | POA: Diagnosis not present

## 2022-07-12 DIAGNOSIS — E7800 Pure hypercholesterolemia, unspecified: Secondary | ICD-10-CM | POA: Diagnosis not present

## 2022-07-12 DIAGNOSIS — N183 Chronic kidney disease, stage 3 unspecified: Secondary | ICD-10-CM | POA: Diagnosis not present

## 2022-07-12 DIAGNOSIS — N3281 Overactive bladder: Secondary | ICD-10-CM | POA: Diagnosis not present

## 2022-07-12 DIAGNOSIS — E039 Hypothyroidism, unspecified: Secondary | ICD-10-CM | POA: Diagnosis not present

## 2022-07-13 ENCOUNTER — Other Ambulatory Visit: Payer: Self-pay | Admitting: Family Medicine

## 2022-07-13 DIAGNOSIS — M7989 Other specified soft tissue disorders: Secondary | ICD-10-CM

## 2022-07-19 ENCOUNTER — Ambulatory Visit
Admission: RE | Admit: 2022-07-19 | Discharge: 2022-07-19 | Disposition: A | Discharge status: Home / Self Care | Payer: Medicare Other | Source: Ambulatory Visit | Attending: Family Medicine | Admitting: Family Medicine

## 2022-07-19 DIAGNOSIS — M7989 Other specified soft tissue disorders: Secondary | ICD-10-CM | POA: Diagnosis not present

## 2022-08-16 DIAGNOSIS — L309 Dermatitis, unspecified: Secondary | ICD-10-CM | POA: Diagnosis not present

## 2022-08-16 DIAGNOSIS — D239 Other benign neoplasm of skin, unspecified: Secondary | ICD-10-CM | POA: Diagnosis not present

## 2022-08-21 ENCOUNTER — Other Ambulatory Visit: Payer: Self-pay | Admitting: *Deleted

## 2022-08-21 DIAGNOSIS — M7989 Other specified soft tissue disorders: Secondary | ICD-10-CM

## 2022-09-04 ENCOUNTER — Other Ambulatory Visit (HOSPITAL_BASED_OUTPATIENT_CLINIC_OR_DEPARTMENT_OTHER): Payer: Self-pay

## 2022-09-04 ENCOUNTER — Encounter (HOSPITAL_BASED_OUTPATIENT_CLINIC_OR_DEPARTMENT_OTHER): Payer: Self-pay

## 2022-09-04 MED ORDER — WEGOVY 0.25 MG/0.5ML ~~LOC~~ SOAJ
0.2500 mg | SUBCUTANEOUS | 0 refills | Status: DC
Start: 2022-09-04 — End: 2023-12-18
  Filled 2022-09-04 – 2022-09-25 (×2): qty 2, 28d supply, fill #0

## 2022-09-05 ENCOUNTER — Ambulatory Visit (INDEPENDENT_AMBULATORY_CARE_PROVIDER_SITE_OTHER): Payer: Medicare Other | Admitting: Vascular Surgery

## 2022-09-05 ENCOUNTER — Encounter: Payer: Self-pay | Admitting: Vascular Surgery

## 2022-09-05 ENCOUNTER — Ambulatory Visit (HOSPITAL_COMMUNITY)
Admission: RE | Admit: 2022-09-05 | Discharge: 2022-09-05 | Disposition: A | Discharge status: Home / Self Care | Payer: Medicare Other | Source: Ambulatory Visit | Attending: Vascular Surgery | Admitting: Vascular Surgery

## 2022-09-05 VITALS — BP 105/74 | HR 91 | Temp 97.3°F | Resp 16 | Ht 62.5 in | Wt 185.0 lb

## 2022-09-05 DIAGNOSIS — M7989 Other specified soft tissue disorders: Secondary | ICD-10-CM

## 2022-09-05 DIAGNOSIS — I868 Varicose veins of other specified sites: Secondary | ICD-10-CM | POA: Insufficient documentation

## 2022-09-05 NOTE — Progress Notes (Signed)
Patient name: Sierra Banks MRN: [id number] DOB: 1950/07/27 Sex: female  REASON FOR CONSULT: swelling right cephalic vein, injured 20+ years ago  HPI: Sierra Banks is a 72 y.o. female, with history of hypertension, hyperlipidemia that presents for evaluation of an area of swelling in her right antecubitum.  She had an injury here about 20 years ago with a softball injury.  States has been present ever since.  She does feel like this has gotten larger.  It really does not bother her much.  She has had no bleeding events.  No history of DVT.  Past Medical History:  Diagnosis Date   ABSCESS, BREAST, RIGHT 10/04/2009   Allergic rhinitis    DEPRESSION 08/28/2006   Dysrhythmia    HEART PALPITATIONS    GERD (gastroesophageal reflux disease)    OCC TUMS    Hepatitis    JAUNDICE AGE 74   Herpes Simplex Virus 07/01/2009   History of kidney stones 04/05/2016   HYPERLIPIDEMIA 12/23/2007   HYPERTENSION 08/28/2006   Hypothyroidism    not as tired as she was   Overweight(278.02) 01/12/2009    Past Surgical History:  Procedure Laterality Date   CARPAL TUNNEL RELEASE     LEFT  2002   CHOLECYSTECTOMY N/A 12/12/2016   Procedure: LAPAROSCOPIC CHOLECYSTECTOMY;  Surgeon: Emelia Loron, MD;  Location: [facility] OR;  Service: General;  Laterality: N/A;   DILATION AND CURETTAGE OF UTERUS     2011   fatty tumor removed from right arm  2016   IR EMBO TUMOR ORGAN ISCHEMIA INFARCT INC GUIDE ROADMAPPING  10/24/2016   IR RADIOLOGIST EVAL & MGMT  10/12/2016   IR RADIOLOGIST EVAL & MGMT  11/16/2016   IR RADIOLOGIST EVAL & MGMT  02/15/2017   IR RADIOLOGIST EVAL & MGMT  08/15/2017   IR RADIOLOGIST EVAL & MGMT  10/08/2018   IR RADIOLOGIST EVAL & MGMT  09/02/2019   IR RENAL SUPRASEL UNI S&I MOD SED  10/24/2016   IR RENAL SUPRASEL UNI S&I MOD SED  10/24/2016   IR US GUIDE VASC ACCESS RIGHT  10/24/2016   JOINT REPLACEMENT Left    MASS EXCISION Right 08/21/2014   Procedure: RIGHT FOREARM MASS EXCISION WITH REPAIR AND  RECONSTRUCTION;  Surgeon: Dominica Severin, MD;  Location: [facility];  Service: Orthopedics;  Laterality: Right;   TONSILLECTOMY     1969   TOTAL KNEE ARTHROPLASTY  05/08/2011   Procedure: TOTAL KNEE ARTHROPLASTY;  Surgeon: Raymon Mutton, MD;  Location: [facility] OR;  Service: Orthopedics;  Laterality: Left;   TUBAL LIGATION     1980    Family History  Problem Relation Age of Onset   Colon polyps Sister    Colon cancer Maternal Uncle    Colon cancer Maternal Grandfather    Heart disease Mother    Hypertension Mother    Esophageal cancer Neg Hx    Pancreatic cancer Neg Hx    Liver disease Neg Hx    Stomach cancer Neg Hx     SOCIAL HISTORY: Social History   Socioeconomic History   Marital status: Married    Spouse name: Not on file   Number of children: Not on file   Years of education: Not on file   Highest education level: Not on file  Occupational History   Not on file  Tobacco Use   Smoking status: Never   Smokeless tobacco: Never  Vaping Use   Vaping status: Never Used  Substance and Sexual Activity  Alcohol use: No   Drug use: No   Sexual activity: Not on file  Other Topics Concern   Not on file  Social History Narrative   Not on file   Social Determinants of Health   Financial Resource Strain: Not on file  Food Insecurity: Not on file  Transportation Needs: Not on file  Physical Activity: Not on file  Stress: Not on file  Social Connections: Not on file  Intimate Partner Violence: Not on file    No Known Allergies  Current Outpatient Medications  Medication Sig Dispense Refill   albuterol (VENTOLIN HFA) 108 (90 Base) MCG/ACT inhaler Inhale 2 puffs into the lungs every 6 (six) hours as needed for wheezing or shortness of breath. 8 g 2   amLODipine (NORVASC) 5 MG tablet Take 2 tablets (10 mg total) by mouth daily. 30 tablet 1   buPROPion (WELLBUTRIN) 75 MG tablet TAKE 1 TABLET AT BEDTIME (Patient taking differently: Take 150 mg by mouth at  bedtime.) 90 tablet 1   cloNIDine (CATAPRES) 0.1 MG tablet Take 0.2 mg by mouth at bedtime.      dicyclomine (BENTYL) 20 MG tablet Take 1 tablet (20 mg total) by mouth 3 (three) times daily before meals. 90 tablet 11   furosemide (LASIX) 40 MG tablet TAKE 1 TABLET BY MOUTH EVERY MORNING 100 tablet 0   levothyroxine (SYNTHROID, LEVOTHROID) 50 MCG tablet Take 50 mcg by mouth daily before breakfast.      Semaglutide-Weight Management (WEGOVY) 0.25 MG/0.5ML SOAJ Inject 0.25 mg into the skin once a week. 2 mL 0   Semaglutide-Weight Management (WEGOVY) 0.5 MG/0.5ML SOAJ Inject 0.5 mg into the skin once a week. 2 mL 0   Semaglutide-Weight Management (WEGOVY) 1 MG/0.5ML SOAJ Inject 1 mg into the skin once a week. 2 mL 0   Semaglutide-Weight Management (WEGOVY) 1 MG/0.5ML SOAJ Inject 1 mg into the skin once a week. 2 mL 0   simvastatin (ZOCOR) 40 MG tablet Take 40 mg by mouth daily.      topiramate (TOPAMAX) 25 MG tablet Take 50 mg by mouth daily.      venlafaxine XR (EFFEXOR-XR) 150 MG 24 hr capsule TAKE ONE CAPSULE BY MOUTH TWICE A DAY (Patient taking differently: Take 300 mg by mouth at bedtime.) 180 capsule 1   atenolol (TENORMIN) 50 MG tablet Take 50 mg by mouth daily.  (Patient not taking: Reported on 01/18/2022)     predniSONE (DELTASONE) 10 MG tablet Take 40 mg daily x 2 days then 20 mg daily x 2 days then 10 mg daily x 2 days (Patient not taking: Reported on 03/08/2020) 14 tablet 0   SUPREP BOWEL PREP KIT 17.5-3.13-1.6 GM/177ML SOLN Take by mouth. (Patient not taking: Reported on 01/18/2022)     No current facility-administered medications for this visit.    REVIEW OF SYSTEMS:  [X]  denotes positive finding, [ ]  denotes negative finding Cardiac  Comments:  Chest pain or chest pressure:    Shortness of breath upon exertion:    Short of breath when lying flat:    Irregular heart rhythm:        Vascular    Pain in calf, thigh, or hip brought on by ambulation:    Pain in feet at night that wakes  you up from your sleep:     Blood clot in your veins:    Leg swelling:         Pulmonary    Oxygen at home:    Productive  cough:     Wheezing:         Neurologic    Sudden weakness in arms or legs:     Sudden numbness in arms or legs:     Sudden onset of difficulty speaking or slurred speech:    Temporary loss of vision in one eye:     Problems with dizziness:         Gastrointestinal    Blood in stool:     Vomited blood:         Genitourinary    Burning when urinating:     Blood in urine:        Psychiatric    Major depression:         Hematologic    Bleeding problems:    Problems with blood clotting too easily:        Skin    Rashes or ulcers:        Constitutional    Fever or chills:      PHYSICAL EXAM: Vitals:   09/05/22 1451  BP: 105/74  Pulse: 91  Resp: 16  Temp: (!) 97.3 F (36.3 C)  TempSrc: Temporal  SpO2: 93%  Weight: 185 lb (83.9 kg)  Height: 5' 2.5" (1.588 m)    GENERAL: The patient is a well-nourished female, in no acute distress. The vital signs are documented above. CARDIAC: There is a regular rate and rhythm.  VASCULAR:  Right radial pulse palpable Area of concern pictured below PULMONARY: No respiratory distress. ABDOMEN: Soft and non-tender MUSCULOSKELETAL: There are no major deformities or cyanosis. NEUROLOGIC: No focal weakness or paresthesias are detected. PSYCHIATRIC: The patient has a normal affect.    DATA:   Venous duplex of the right upper extremity shows no evidence of DVT and this does not communicate with the brachial artery.  Assessment/Plan:  72 year old female here for evaluation of an area of swelling in her right antecubital region as pictured above.  She states she had a softball injury here about 20 years ago when this first appeared.  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  I think this is harmless.  I think this can be safely observed.  She  has no DVT.  I do not think this puts her at any increased risk.  Discussed she can let me know if starts bothering her and we can certainly excise it in the OR.   Cephus Shelling, MD [facility] Office: [phone]

## 2022-09-09 ENCOUNTER — Other Ambulatory Visit (HOSPITAL_BASED_OUTPATIENT_CLINIC_OR_DEPARTMENT_OTHER): Payer: Self-pay

## 2022-09-25 ENCOUNTER — Other Ambulatory Visit (HOSPITAL_BASED_OUTPATIENT_CLINIC_OR_DEPARTMENT_OTHER): Payer: Self-pay

## 2022-09-26 ENCOUNTER — Other Ambulatory Visit (HOSPITAL_BASED_OUTPATIENT_CLINIC_OR_DEPARTMENT_OTHER): Payer: Self-pay

## 2022-09-28 ENCOUNTER — Other Ambulatory Visit (HOSPITAL_BASED_OUTPATIENT_CLINIC_OR_DEPARTMENT_OTHER): Payer: Self-pay

## 2022-09-28 DIAGNOSIS — N183 Chronic kidney disease, stage 3 unspecified: Secondary | ICD-10-CM | POA: Diagnosis not present

## 2022-09-28 DIAGNOSIS — R350 Frequency of micturition: Secondary | ICD-10-CM | POA: Diagnosis not present

## 2022-09-28 DIAGNOSIS — I129 Hypertensive chronic kidney disease with stage 1 through stage 4 chronic kidney disease, or unspecified chronic kidney disease: Secondary | ICD-10-CM | POA: Diagnosis not present

## 2022-09-28 DIAGNOSIS — N3090 Cystitis, unspecified without hematuria: Secondary | ICD-10-CM | POA: Diagnosis not present

## 2022-09-28 DIAGNOSIS — E669 Obesity, unspecified: Secondary | ICD-10-CM | POA: Diagnosis not present

## 2022-09-28 DIAGNOSIS — F339 Major depressive disorder, recurrent, unspecified: Secondary | ICD-10-CM | POA: Diagnosis not present

## 2022-09-28 DIAGNOSIS — I868 Varicose veins of other specified sites: Secondary | ICD-10-CM | POA: Diagnosis not present

## 2022-09-28 MED ORDER — WEGOVY 0.25 MG/0.5ML ~~LOC~~ SOAJ
0.2500 mg | SUBCUTANEOUS | 0 refills | Status: DC
  Filled 2022-09-28: qty 2, 28d supply, fill #0

## 2022-09-29 ENCOUNTER — Other Ambulatory Visit (HOSPITAL_BASED_OUTPATIENT_CLINIC_OR_DEPARTMENT_OTHER): Payer: Self-pay

## 2022-09-29 ENCOUNTER — Encounter (HOSPITAL_BASED_OUTPATIENT_CLINIC_OR_DEPARTMENT_OTHER): Payer: Self-pay

## 2022-10-03 ENCOUNTER — Other Ambulatory Visit (HOSPITAL_BASED_OUTPATIENT_CLINIC_OR_DEPARTMENT_OTHER): Payer: Self-pay

## 2022-10-03 ENCOUNTER — Other Ambulatory Visit: Payer: Self-pay

## 2022-10-04 ENCOUNTER — Other Ambulatory Visit (HOSPITAL_BASED_OUTPATIENT_CLINIC_OR_DEPARTMENT_OTHER): Payer: Self-pay

## 2022-12-26 DIAGNOSIS — Z1231 Encounter for screening mammogram for malignant neoplasm of breast: Secondary | ICD-10-CM | POA: Diagnosis not present

## 2023-01-02 DIAGNOSIS — Z01419 Encounter for gynecological examination (general) (routine) without abnormal findings: Secondary | ICD-10-CM | POA: Diagnosis not present

## 2023-01-09 DIAGNOSIS — R399 Unspecified symptoms and signs involving the genitourinary system: Secondary | ICD-10-CM | POA: Diagnosis not present

## 2023-01-09 DIAGNOSIS — N390 Urinary tract infection, site not specified: Secondary | ICD-10-CM | POA: Diagnosis not present

## 2023-01-11 ENCOUNTER — Emergency Department (HOSPITAL_BASED_OUTPATIENT_CLINIC_OR_DEPARTMENT_OTHER)
Admission: EM | Admit: 2023-01-11 | Discharge: 2023-01-11 | Disposition: A | Discharge status: Home / Self Care | Payer: Medicare Other | Attending: Emergency Medicine | Admitting: Emergency Medicine

## 2023-01-11 ENCOUNTER — Other Ambulatory Visit: Payer: Self-pay

## 2023-01-11 ENCOUNTER — Emergency Department (HOSPITAL_BASED_OUTPATIENT_CLINIC_OR_DEPARTMENT_OTHER): Payer: Medicare Other

## 2023-01-11 DIAGNOSIS — I10 Essential (primary) hypertension: Secondary | ICD-10-CM | POA: Diagnosis not present

## 2023-01-11 DIAGNOSIS — K5730 Diverticulosis of large intestine without perforation or abscess without bleeding: Secondary | ICD-10-CM | POA: Diagnosis not present

## 2023-01-11 DIAGNOSIS — R000 Tachycardia, unspecified: Secondary | ICD-10-CM | POA: Insufficient documentation

## 2023-01-11 DIAGNOSIS — N200 Calculus of kidney: Secondary | ICD-10-CM

## 2023-01-11 DIAGNOSIS — Z79899 Other long term (current) drug therapy: Secondary | ICD-10-CM | POA: Diagnosis not present

## 2023-01-11 DIAGNOSIS — R112 Nausea with vomiting, unspecified: Secondary | ICD-10-CM

## 2023-01-11 DIAGNOSIS — R109 Unspecified abdominal pain: Secondary | ICD-10-CM | POA: Diagnosis present

## 2023-01-11 DIAGNOSIS — N201 Calculus of ureter: Secondary | ICD-10-CM | POA: Insufficient documentation

## 2023-01-11 DIAGNOSIS — R197 Diarrhea, unspecified: Secondary | ICD-10-CM | POA: Diagnosis not present

## 2023-01-11 DIAGNOSIS — Z20822 Contact with and (suspected) exposure to covid-19: Secondary | ICD-10-CM | POA: Insufficient documentation

## 2023-01-11 DIAGNOSIS — D1771 Benign lipomatous neoplasm of kidney: Secondary | ICD-10-CM | POA: Diagnosis not present

## 2023-01-11 LAB — COMPREHENSIVE METABOLIC PANEL
ALT: 12 U/L (ref 0–44)
AST: 14 U/L — ABNORMAL LOW (ref 15–41)
Albumin: 4.3 g/dL (ref 3.5–5.0)
Alkaline Phosphatase: 110 U/L (ref 38–126)
Anion gap: 10 (ref 5–15)
BUN: 14 mg/dL (ref 8–23)
CO2: 30 mmol/L (ref 22–32)
Calcium: 11 mg/dL — ABNORMAL HIGH (ref 8.9–10.3)
Chloride: 103 mmol/L (ref 98–111)
Creatinine, Ser: 1.18 mg/dL — ABNORMAL HIGH (ref 0.44–1.00)
GFR, Estimated: 49 mL/min — ABNORMAL LOW (ref >60)
Glucose, Bld: 136 mg/dL — ABNORMAL HIGH (ref 70–99)
Potassium: 3.1 mmol/L — ABNORMAL LOW (ref 3.5–5.1)
Sodium: 143 mmol/L (ref 135–145)
Total Bilirubin: 0.6 mg/dL (ref <1.2)
Total Protein: 7.5 g/dL (ref 6.5–8.1)

## 2023-01-11 LAB — CBC WITH DIFFERENTIAL/PLATELET
Abs Immature Granulocytes: 0.04 10*3/uL (ref 0.00–0.07)
Basophils Absolute: 0.1 10*3/uL (ref 0.0–0.1)
Basophils Relative: 1 %
Eosinophils Absolute: 0.1 10*3/uL (ref 0.0–0.5)
Eosinophils Relative: 1 %
HCT: 45.1 % (ref 36.0–46.0)
Hemoglobin: 15.2 g/dL — ABNORMAL HIGH (ref 12.0–15.0)
Immature Granulocytes: 0 %
Lymphocytes Relative: 18 %
Lymphs Abs: 2.5 10*3/uL (ref 0.7–4.0)
MCH: 28.8 pg (ref 26.0–34.0)
MCHC: 33.7 g/dL (ref 30.0–36.0)
MCV: 85.4 fL (ref 80.0–100.0)
Monocytes Absolute: 0.8 10*3/uL (ref 0.1–1.0)
Monocytes Relative: 6 %
Neutro Abs: 10.5 10*3/uL — ABNORMAL HIGH (ref 1.7–7.7)
Neutrophils Relative %: 74 %
Platelets: 360 10*3/uL (ref 150–400)
RBC: 5.28 MIL/uL — ABNORMAL HIGH (ref 3.87–5.11)
RDW: 13.4 % (ref 11.5–15.5)
WBC: 14 10*3/uL — ABNORMAL HIGH (ref 4.0–10.5)
nRBC: 0 % (ref 0.0–0.2)

## 2023-01-11 LAB — URINALYSIS, W/ REFLEX TO CULTURE (INFECTION SUSPECTED)
Bacteria, UA: NONE SEEN
Bilirubin Urine: NEGATIVE
Glucose, UA: NEGATIVE mg/dL
Nitrite: NEGATIVE
Protein, ur: 30 mg/dL — AB
Specific Gravity, Urine: 1.025 (ref 1.005–1.030)
pH: 6 (ref 5.0–8.0)

## 2023-01-11 LAB — RESP PANEL BY RT-PCR (RSV, FLU A&B, COVID)  RVPGX2
Influenza A by PCR: NEGATIVE
Influenza B by PCR: NEGATIVE
Resp Syncytial Virus by PCR: NEGATIVE
SARS Coronavirus 2 by RT PCR: NEGATIVE

## 2023-01-11 LAB — LACTIC ACID, PLASMA: Lactic Acid, Venous: 1.5 mmol/L (ref 0.5–1.9)

## 2023-01-11 MED ORDER — ONDANSETRON HCL 4 MG/2ML IJ SOLN
4.0000 mg | Freq: Once | INTRAMUSCULAR | Status: AC
  Administered 2023-01-11: 4 mg via INTRAVENOUS
  Filled 2023-01-11: qty 2

## 2023-01-11 MED ORDER — ONDANSETRON 4 MG PO TBDP: 4.0000 mg | ORAL_TABLET | Freq: Three times a day (TID) | ORAL | 0 refills | Status: AC | PRN

## 2023-01-11 MED ORDER — SODIUM CHLORIDE 0.9 % IV BOLUS (SEPSIS)
1000.0000 mL | Freq: Once | INTRAVENOUS | Status: AC
  Administered 2023-01-11: 1000 mL via INTRAVENOUS

## 2023-01-11 MED ORDER — SODIUM CHLORIDE 0.9 % IV SOLN
1.0000 g | Freq: Once | INTRAVENOUS | Status: AC
  Administered 2023-01-11: 1 g via INTRAVENOUS
  Filled 2023-01-11: qty 10

## 2023-01-11 MED ORDER — OXYCODONE HCL 5 MG PO TABS: 5.0000 mg | ORAL_TABLET | ORAL | 0 refills | Status: DC | PRN

## 2023-01-11 NOTE — ED Triage Notes (Signed)
Pt reports vomiting upon standing since last Saturday, along with chills, but no known fever. Pt reports suprapubic pain and earlier in the week states she had left flank pain.  Pt seen in UC and sent home with Rx ABX and pt states vomiting has not resolved.  Pt reports mild dizziness that started this morning.

## 2023-01-11 NOTE — ED Provider Notes (Signed)
[place] EMERGENCY DEPARTMENT AT [facility] Provider Note   CSN: [id number] Arrival date & time: 01/11/23  1113     History  Chief Complaint  Patient presents with   Emesis    Sierra Banks is a 72 y.o. female.  72 year old female with history of hypertension, hyperlipidemia, and cholecystectomy who presents to the emergency department with nausea and vomiting and abdominal pain.  Over the weekend started experiencing left-sided flank pain.  Says that it gradually migrated down to the suprapubic.  Also has been having chills.  Has been having some nausea and vomiting.  Has been having several episodes of loose stool per day as well.  Says that she went to [facility] urgent care on Tuesday and was diagnosed with a possible UTI and they said that she may also have a kidney stone.  They sent her home on Keflex which she has been taking.  Says that she was not feeling any better today so came into the emergency department.       Home Medications Prior to Admission medications   Medication Sig Start Date End Date Taking? Authorizing Provider  ondansetron (ZOFRAN-ODT) 4 MG disintegrating tablet Take 1 tablet (4 mg total) by mouth every 8 (eight) hours as needed for nausea or vomiting. 01/11/23  Yes Rondel Baton, MD  oxyCODONE (ROXICODONE) 5 MG immediate release tablet Take 1 tablet (5 mg total) by mouth every 4 (four) hours as needed. 01/11/23  Yes Rondel Baton, MD  albuterol (VENTOLIN HFA) 108 (90 Base) MCG/ACT inhaler Inhale 2 puffs into the lungs every 6 (six) hours as needed for wheezing or shortness of breath. 12/29/19   Roberto Scales D, MD  amLODipine (NORVASC) 5 MG tablet Take 2 tablets (10 mg total) by mouth daily. 12/29/19   Laverna Peace, MD  atenolol (TENORMIN) 50 MG tablet Take 50 mg by mouth daily.  Patient not taking: Reported on 01/18/2022    [provider]  buPROPion (WELLBUTRIN) 75 MG tablet TAKE 1 TABLET AT BEDTIME Patient taking  differently: Take 150 mg by mouth at bedtime. 09/20/12   Roderick Pee, MD  cloNIDine (CATAPRES) 0.1 MG tablet Take 0.2 mg by mouth at bedtime.     [provider]  dicyclomine (BENTYL) 20 MG tablet Take 1 tablet (20 mg total) by mouth 3 (three) times daily before meals. 01/18/22   Meryl Dare, MD  furosemide (LASIX) 40 MG tablet TAKE 1 TABLET BY MOUTH EVERY MORNING 09/20/12   Roderick Pee, MD  levothyroxine (SYNTHROID, LEVOTHROID) 50 MCG tablet Take 50 mcg by mouth daily before breakfast.     [provider]  predniSONE (DELTASONE) 10 MG tablet Take 40 mg daily x 2 days then 20 mg daily x 2 days then 10 mg daily x 2 days Patient not taking: Reported on 03/08/2020 12/29/19   Laverna Peace, MD  Semaglutide-Weight Management (WEGOVY) 0.25 MG/0.5ML SOAJ Inject 0.25 mg into the skin once a week. 09/04/22     Semaglutide-Weight Management (WEGOVY) 0.25 MG/0.5ML SOAJ Inject 0.25 mg into the skin once a week. 09/28/22     Semaglutide-Weight Management (WEGOVY) 0.5 MG/0.5ML SOAJ Inject 0.5 mg into the skin once a week. 06/02/22     Semaglutide-Weight Management (WEGOVY) 1 MG/0.5ML SOAJ Inject 1 mg into the skin once a week. 06/02/22     Semaglutide-Weight Management (WEGOVY) 1 MG/0.5ML SOAJ Inject 1 mg into the skin once a week. 06/29/22     simvastatin (ZOCOR) 40 MG  tablet Take 40 mg by mouth daily.  03/28/11   Roderick Pee, MD  SUPREP BOWEL PREP KIT 17.5-3.13-1.6 GM/177ML SOLN Take by mouth. Patient not taking: Reported on 01/18/2022 11/20/19   [provider]  topiramate (TOPAMAX) 25 MG tablet Take 50 mg by mouth daily.     [provider]  venlafaxine XR (EFFEXOR-XR) 150 MG 24 hr capsule TAKE ONE CAPSULE BY MOUTH TWICE A DAY Patient taking differently: Take 300 mg by mouth at bedtime. 06/18/12   Roderick Pee, MD      Allergies    Patient has no known allergies.    Review of Systems   Review of Systems  Physical Exam Updated Vital Signs BP (!) 160/92    Pulse 87   Temp 98.5 F (36.9 C) (Oral)   Resp (!) 21   SpO2 97%  Physical Exam Vitals and nursing note reviewed.  Constitutional:      General: She is not in acute distress.    Appearance: She is well-developed.  HENT:     Head: Normocephalic and atraumatic.     Right Ear: External ear normal.     Left Ear: External ear normal.     Nose: Nose normal.  Eyes:     Extraocular Movements: Extraocular movements intact.     Conjunctiva/sclera: Conjunctivae normal.     Pupils: Pupils are equal, round, and reactive to light.  Cardiovascular:     Rate and Rhythm: Regular rhythm. Tachycardia present.     Heart sounds: No murmur heard. Pulmonary:     Effort: Pulmonary effort is normal. No respiratory distress.     Breath sounds: Normal breath sounds.  Abdominal:     General: Abdomen is flat. There is no distension.     Palpations: Abdomen is soft. There is no mass.     Tenderness: There is no abdominal tenderness. There is no right CVA tenderness, left CVA tenderness or guarding.  Musculoskeletal:     Cervical back: Normal range of motion and neck supple.  Skin:    General: Skin is warm and dry.  Neurological:     Mental Status: She is alert and oriented to person, place, and time. Mental status is at baseline.  Psychiatric:        Mood and Affect: Mood normal.     ED Results / Procedures / Treatments   Labs (all labs ordered are listed, but only abnormal results are displayed) Labs Reviewed  COMPREHENSIVE METABOLIC PANEL - Abnormal; Notable for the following components:      Result Value   Potassium 3.1 (*)    Glucose, Bld 136 (*)    Creatinine, Ser 1.18 (*)    Calcium 11.0 (*)    AST 14 (*)    GFR, Estimated 49 (*)    All other components within normal limits  CBC WITH DIFFERENTIAL/PLATELET - Abnormal; Notable for the following components:   WBC 14.0 (*)    RBC 5.28 (*)    Hemoglobin 15.2 (*)    Neutro Abs 10.5 (*)    All other components within normal limits   URINALYSIS, W/ REFLEX TO CULTURE (INFECTION SUSPECTED) - Abnormal; Notable for the following components:   APPearance HAZY (*)    Hgb urine dipstick SMALL (*)    Ketones, ur TRACE (*)    Protein, ur 30 (*)    Leukocytes,Ua TRACE (*)    All other components within normal limits  RESP PANEL BY RT-PCR (RSV, FLU A&B, COVID)  RVPGX2  CULTURE, BLOOD (ROUTINE X 2)  CULTURE, BLOOD (ROUTINE X 2)  URINE CULTURE  URINE CULTURE  LACTIC ACID, PLASMA  LACTIC ACID, PLASMA    EKG EKG Interpretation Date/Time:  Thursday January 11 2023 12:03:36 EST Ventricular Rate:  105 PR Interval:  179 QRS Duration:  98 QT Interval:  347 QTC Calculation: 459 R Axis:   -31  Text Interpretation: Sinus tachycardia Left ventricular hypertrophy Anterior Q waves, possibly due to LVH Confirmed by Vonita Moss [id number]) on 01/11/2023 2:09:00 PM  Radiology CT Renal Stone Study  Result Date: 01/11/2023 CLINICAL DATA:  Vomiting. Flank pain. Chills. History of a angiomyolipoma. EXAM: CT ABDOMEN AND PELVIS WITHOUT CONTRAST TECHNIQUE: Multidetector CT imaging of the abdomen and pelvis was performed following the standard protocol without IV contrast. RADIATION DOSE REDUCTION: This exam was performed according to the departmental dose-optimization program which includes automated exposure control, adjustment of the mA and/or kV according to patient size and/or use of iterative reconstruction technique. COMPARISON:  MRI abdomen 08/29/2019. CT 05/10/2018 FINDINGS: Lower chest: Lung bases are clear. No pleural effusion. Trace pericardial fluid. Coronary artery calcifications are seen. Hepatobiliary: No focal liver abnormality is seen. Status post cholecystectomy. No biliary dilatation. Pancreas: Moderate atrophy of the pancreas. Spleen: Normal in size without focal abnormality. Adrenals/Urinary Tract: Adrenal glands are preserved. Mild left-sided renal collecting system dilatation down to the distal ureter where there is distal  left ureteral stone measuring 5 mm on series 2, image 73. The bladder is contracted. No additional left intrarenal stones. There are embolization changes involving the inferior medial right kidney with an area of macroscopic fat and surrounding soft tissue thickening. Level of thickening along the margins is increased which could be evolution of embolization change. No right renal stones suggested. Right ureter has a normal course and caliber down to the bladder. Calcification seen on image 74 in the pelvis is adjacent to and not clearly within the course of the urethra on coronal imaging. Likely a vascular calcification. Stomach/Bowel: On this non oral contrast exam there is fluid and debris in the stomach. Small bowel is nondilated. Large bowel is of normal course and caliber with some scattered stool. Few left-sided colonic diverticula. Normal appendix in the right hemipelvis. Vascular/Lymphatic: Aortic atherosclerosis. No enlarged abdominal or pelvic lymph nodes. Reproductive: Uterus and bilateral adnexa are unremarkable. Other: No free air or free fluid. Musculoskeletal: Moderate degenerative changes of the spine. Multiple levels of posterior disc bulging and osteophytes. Areas of stenosis. IMPRESSION: 5 mm stone along the extreme distal left ureter with mild collecting system dilatation. Maturing post embolization changes involving the right renal lower pole. Few colonic diverticula.  Normal appendix Electronically Signed   By: Karen Kays M.D.   On: 01/11/2023 12:04    Procedures Procedures    Medications Ordered in ED Medications  sodium chloride 0.9 % bolus 1,000 mL (0 mLs Intravenous Stopped 01/11/23 1356)  ondansetron (ZOFRAN) injection 4 mg (4 mg Intravenous Given 01/11/23 1241)  cefTRIAXone (ROCEPHIN) 1 g in sodium chloride 0.9 % 100 mL IVPB (0 g Intravenous Stopped 01/11/23 1519)  ondansetron (ZOFRAN) injection 4 mg (4 mg Intravenous Given 01/11/23 1457)    ED Course/ Medical Decision  Making/ A&P Clinical Course as of 01/11/23 1643  Thu Jan 11, 2023  1214 CT Renal Stone Study Distal 5 mm stone [RP]  1403 Creatinine(!): 1.18 At baseline [RP]  1404 Dr Pete Glatter from urology consulted.  Feels infected stone is less likely given her workup today.  He says he was  able to locate the urine culture from her recent visit and showed only mixed urogenital flora and was less than 10,000 CFU's.  Recommends her follow-up as an outpatient for her symptoms. [RP]    Clinical Course User Index [RP] Rondel Baton, MD                                 Medical Decision Making Amount and/or Complexity of Data Reviewed Labs: ordered. Decision-making details documented in ED Course. Radiology: ordered. Decision-making details documented in ED Course. ECG/medicine tests: ordered.  Risk Prescription drug management.   Sierra Banks is a 72 y.o. female with comorbidities that complicate the patient evaluation including hypertension, hyperlipidemia, and cholecystectomy who presents to the emergency department with nausea and vomiting and abdominal pain.     Initial Ddx:  Kidney stone, infected kidney stone, diverticulitis, pyelonephritis, perinephric abscess  MDM/Course:  Patient presents to the emergency department with left flank pain.  Radiated down to her groin now.  Also is having some chills and nausea and vomiting which is somewhat concerning.  On exam no significant tenderness to palpation and was tachycardic but overall well-appearing.  Due to concerns for possible infected stone labs were sent including blood cultures and lactate.  Her white blood cell count was found to be elevated and she started on ceftriaxone.  She had a CT scan which does show a distal 5 mm stone in her left ureter.  Urinalysis shows leukoesterase and white blood cells but no nitrites or bacteria.  Because of her chills and elevated white blood cell count and tachycardia was discussed with urology.  Was  felt that an infected stone is less likely.  Thought that the chills were likely due to pain and the leukocytosis due to the stress and reaction from her stone.  Creatinine at baseline.  Upon re-evaluation was feeling much better after the Zofran and ceftriaxone.  Requesting to go home at this time and I feel that this is reasonable.  Was given instructions to take Flomax, Tylenol, ibuprofen, and oxycodone for breakthrough pain.  Given a strainer prior to discharge and instructed to follow-up with urology.  This patient presents to the ED for concern of complaints listed in HPI, this involves an extensive number of treatment options, and is a complaint that carries with it a high risk of complications and morbidity. Disposition including potential need for admission considered.   Dispo: DC Home. Return precautions discussed including, but not limited to, those listed in the AVS. Allowed pt time to ask questions which were answered fully prior to dc.  Additional history obtained from spouse Records reviewed Outpatient Clinic Notes The following labs were independently interpreted: Chemistry and show CKD I independently reviewed the following imaging with scope of interpretation limited to determining acute life threatening conditions related to emergency care: CT Abdomen/Pelvis and agree with the radiologist interpretation with the following exceptions: none I personally reviewed and interpreted cardiac monitoring: normal sinus rhythm  I personally reviewed and interpreted the pt's EKG: see above for interpretation  I have reviewed the patients home medications and made adjustments as needed Consults: Urology Social Determinants of health:  Elderly  Portions of this note were generated with Scientist, clinical (histocompatibility and immunogenetics). Dictation errors may occur despite best attempts at proofreading.           Final Clinical Impression(s) / ED Diagnoses Final diagnoses:  Nephrolithiasis  Nausea and  vomiting, unspecified  vomiting type    Rx / DC Orders ED Discharge Orders          Ordered    ondansetron (ZOFRAN-ODT) 4 MG disintegrating tablet  Every 8 hours PRN        01/11/23 1446    oxyCODONE (ROXICODONE) 5 MG immediate release tablet  Every 4 hours PRN        01/11/23 1446              Rondel Baton, MD 01/11/23 1643

## 2023-01-11 NOTE — Discharge Instructions (Signed)
You were seen for your kidney stone in the emergency department.    At home, please take Tylenol and ibuprofen for your pain. You may also take the oxycodone we have prescribed you for any breakthrough pain that may have.  Do not take this before driving or operating heavy machinery.  Do not take this medication with alcohol.  Take Zofran as needed for nausea and vomiting  Use the flowmax we have give you every day until the kidney stone passes. Please also strain your urine to collect the kidney stone. Store it in a container and take it to your urologist for analysis.   Follow-up with urology in a week to discuss your symptoms.   Return immediately to the emergency department if you experience any of the following: fever, unbearable pain, urinary retention, or any other concerning symptoms.    Thank you for visiting our Emergency Department. It was a pleasure taking care of you today.

## 2023-01-13 LAB — URINE CULTURE: Culture: 10000 — AB

## 2023-01-14 ENCOUNTER — Telehealth (HOSPITAL_BASED_OUTPATIENT_CLINIC_OR_DEPARTMENT_OTHER): Payer: Self-pay | Admitting: *Deleted

## 2023-01-14 NOTE — Progress Notes (Signed)
ED Antimicrobial Stewardship Positive Culture Follow Up   Sierra Banks is an 72 y.o. female who presented to [facility] on 01/11/2023 with a chief complaint of  Chief Complaint  Patient presents with   Emesis    Recent Results (from the past 720 hour(s))  Urine Culture     Status: Abnormal   Collection Time: 01/11/23 11:30 AM   Specimen: Urine, Random  Result Value Ref Range Status   Specimen Description   Final    URINE, RANDOM Performed at [facility], [address] [place], [place] [address]    Special Requests   Final    NONE Reflexed from [id number] Performed at [facility], [address] [place], [place] [address]    Culture (A)  Final    10,000 COLONIES/mL DIPHTHEROIDS(CORYNEBACTERIUM SPECIES) Standardized susceptibility testing for this organism is not available. Performed at [facility], [address], [place], [place] [address]    Report Status 01/13/2023 FINAL  Final  Resp panel by RT-PCR (RSV, Flu A&B, Covid) Anterior Nasal Swab     Status: None   Collection Time: 01/11/23 12:05 PM   Specimen: Anterior Nasal Swab  Result Value Ref Range Status   SARS Coronavirus 2 by RT PCR NEGATIVE NEGATIVE Final    Comment: (NOTE) SARS-CoV-2 target nucleic acids are NOT DETECTED.  The SARS-CoV-2 RNA is generally detectable in upper respiratory specimens during the acute phase of infection. The lowest concentration of SARS-CoV-2 viral copies this assay can detect is 138 copies/mL. A negative result does not preclude SARS-Cov-2 infection and should not be used as the sole basis for treatment or other patient management decisions. A negative result may occur with  improper specimen collection/handling, submission of specimen other than nasopharyngeal swab, presence of viral mutation(s) within the areas targeted by this assay, and inadequate number of viral copies(<138 copies/mL). A negative result must be combined  with clinical observations, patient history, and epidemiological information. The expected result is Negative.  Fact Sheet for Patients:  BloggerCourse.com  Fact Sheet for Healthcare Providers:  SeriousBroker.it  This test is no t yet approved or cleared by the [place] FDA and  has been authorized for detection and/or diagnosis of SARS-CoV-2 by FDA under an Emergency Use Authorization (EUA). This EUA will remain  in effect (meaning this test can be used) for the duration of the COVID-19 declaration under Section 564(b)(1) of the Act, 21 U.S.C.section 360bbb-3(b)(1), unless the authorization is terminated  or revoked sooner.       Influenza A by PCR NEGATIVE NEGATIVE Final   Influenza B by PCR NEGATIVE NEGATIVE Final    Comment: (NOTE) The Xpert Xpress SARS-CoV-2/FLU/RSV plus assay is intended as an aid in the diagnosis of influenza from Nasopharyngeal swab specimens and should not be used as a sole basis for treatment. Nasal washings and aspirates are unacceptable for Xpert Xpress SARS-CoV-2/FLU/RSV testing.  Fact Sheet for Patients: BloggerCourse.com  Fact Sheet for Healthcare Providers: SeriousBroker.it  This test is not yet approved or cleared by the [place] FDA and has been authorized for detection and/or diagnosis of SARS-CoV-2 by FDA under an Emergency Use Authorization (EUA). This EUA will remain in effect (meaning this test can be used) for the duration of the COVID-19 declaration under Section 564(b)(1) of the Act, 21 U.S.C. section 360bbb-3(b)(1), unless the authorization is terminated or revoked.     Resp Syncytial Virus by PCR NEGATIVE NEGATIVE Final    Comment: (NOTE) Fact Sheet for Patients:  BloggerCourse.com  Fact Sheet for Healthcare Providers: SeriousBroker.it  This test is not yet approved  or cleared by the [place] FDA and has been authorized for detection and/or diagnosis of SARS-CoV-2 by FDA under an Emergency Use Authorization (EUA). This EUA will remain in effect (meaning this test can be used) for the duration of the COVID-19 declaration under Section 564(b)(1) of the Act, 21 U.S.C. section 360bbb-3(b)(1), unless the authorization is terminated or revoked.  Performed at [facility], [address] [place], [place] [address]   Blood Culture (routine x 2)     Status: None (Preliminary result)   Collection Time: 01/11/23 12:27 PM   Specimen: BLOOD  Result Value Ref Range Status   Specimen Description   Final    BLOOD LEFT ANTECUBITAL Performed at [facility], [address], [place], [place] [address]    Special Requests   Final    BOTTLES DRAWN AEROBIC AND ANAEROBIC Blood Culture adequate volume Performed at [facility], [address], [place], [place] [address]    Culture   Final    NO GROWTH 3 DAYS Performed at [facility], [address], [place], [place] [address]    Report Status PENDING  Incomplete   Previously prescribed cephalexin at [facility] urgent care on 01/09/23 for UTI. UA during ED visit was positive for leukocytes and WBC, but no bacteria suggesting resolution of UTI. Urine culture grew diphtheroids, which is susceptible to cephalexin. Patient diagnosed with kidney stones and should continue current course of cephalexin to complete treatment for UTI.  New antibiotic prescription: None   Doristine Counter, PharmD, BCPS 01/14/2023, 10:12 AM Clinical Pharmacist Monday - Friday phone -  [phone] Saturday - Sunday phone - [phone]

## 2023-01-14 NOTE — Telephone Encounter (Signed)
Post ED Visit - Positive Culture Follow-up  Culture report reviewed by antimicrobial stewardship pharmacist: [facility] []  Enzo Bi, Pharm.D. []  Celedonio Miyamoto, Pharm.D., BCPS AQ-ID []  Garvin Fila, Pharm.D., BCPS []  Georgina Pillion, Pharm.D., BCPS []  [place], [address].D., BCPS, AAHIVP []  Estella Husk, Pharm.D., BCPS, AAHIVP []  Lysle Pearl, PharmD, BCPS []  Phillips Climes, PharmD, BCPS []  Agapito Games, PharmD, BCPS []  Verlan Friends, PharmD []  Mervyn Gay, PharmD, BCPS [x]  Wilburn Cornelia, PharmD  [facility] []  Len Childs, PharmD []  Greer Pickerel, PharmD []  Adalberto Cole, PharmD []  Perlie Gold, Rph []  Lonell Face) Jean Rosenthal, PharmD []  Earl Many, PharmD []  Junita Push, PharmD []  Dorna Leitz, PharmD []  Terrilee Files, PharmD []  Lynann Beaver, PharmD []  Keturah Barre, PharmD []  Loralee Pacas, PharmD []  Bernadene Person, PharmD   Positive urine culture Treated with Cephalexin prescribed at urgent care, organism sensitive to the same and no further patient follow-up is required at this time.  Patsey Berthold 01/14/2023, 10:24 AM

## 2023-01-16 DIAGNOSIS — N201 Calculus of ureter: Secondary | ICD-10-CM | POA: Diagnosis not present

## 2023-01-16 LAB — CULTURE, BLOOD (ROUTINE X 2)
Culture: NO GROWTH
Special Requests: ADEQUATE

## 2023-01-29 DIAGNOSIS — N200 Calculus of kidney: Secondary | ICD-10-CM | POA: Diagnosis not present

## 2023-01-29 DIAGNOSIS — E7800 Pure hypercholesterolemia, unspecified: Secondary | ICD-10-CM | POA: Diagnosis not present

## 2023-01-29 DIAGNOSIS — N183 Chronic kidney disease, stage 3 unspecified: Secondary | ICD-10-CM | POA: Diagnosis not present

## 2023-01-29 DIAGNOSIS — E669 Obesity, unspecified: Secondary | ICD-10-CM | POA: Diagnosis not present

## 2023-01-29 DIAGNOSIS — F339 Major depressive disorder, recurrent, unspecified: Secondary | ICD-10-CM | POA: Diagnosis not present

## 2023-01-29 DIAGNOSIS — N201 Calculus of ureter: Secondary | ICD-10-CM | POA: Diagnosis not present

## 2023-01-29 DIAGNOSIS — Z8639 Personal history of other endocrine, nutritional and metabolic disease: Secondary | ICD-10-CM | POA: Diagnosis not present

## 2023-01-29 DIAGNOSIS — E039 Hypothyroidism, unspecified: Secondary | ICD-10-CM | POA: Diagnosis not present

## 2023-01-29 DIAGNOSIS — I129 Hypertensive chronic kidney disease with stage 1 through stage 4 chronic kidney disease, or unspecified chronic kidney disease: Secondary | ICD-10-CM | POA: Diagnosis not present

## 2023-04-30 DIAGNOSIS — E7800 Pure hypercholesterolemia, unspecified: Secondary | ICD-10-CM | POA: Diagnosis not present

## 2023-04-30 DIAGNOSIS — E669 Obesity, unspecified: Secondary | ICD-10-CM | POA: Diagnosis not present

## 2023-04-30 DIAGNOSIS — I129 Hypertensive chronic kidney disease with stage 1 through stage 4 chronic kidney disease, or unspecified chronic kidney disease: Secondary | ICD-10-CM | POA: Diagnosis not present

## 2023-04-30 DIAGNOSIS — E039 Hypothyroidism, unspecified: Secondary | ICD-10-CM | POA: Diagnosis not present

## 2023-04-30 DIAGNOSIS — N183 Chronic kidney disease, stage 3 unspecified: Secondary | ICD-10-CM | POA: Diagnosis not present

## 2023-04-30 DIAGNOSIS — H9201 Otalgia, right ear: Secondary | ICD-10-CM | POA: Diagnosis not present

## 2023-06-06 DIAGNOSIS — H0102B Squamous blepharitis left eye, upper and lower eyelids: Secondary | ICD-10-CM | POA: Diagnosis not present

## 2023-06-06 DIAGNOSIS — H02413 Mechanical ptosis of bilateral eyelids: Secondary | ICD-10-CM | POA: Diagnosis not present

## 2023-06-06 DIAGNOSIS — H2513 Age-related nuclear cataract, bilateral: Secondary | ICD-10-CM | POA: Diagnosis not present

## 2023-06-06 DIAGNOSIS — H353121 Nonexudative age-related macular degeneration, left eye, early dry stage: Secondary | ICD-10-CM | POA: Diagnosis not present

## 2023-06-19 DIAGNOSIS — E876 Hypokalemia: Secondary | ICD-10-CM | POA: Diagnosis not present

## 2023-06-19 DIAGNOSIS — N1831 Chronic kidney disease, stage 3a: Secondary | ICD-10-CM | POA: Diagnosis not present

## 2023-06-19 DIAGNOSIS — N200 Calculus of kidney: Secondary | ICD-10-CM | POA: Diagnosis not present

## 2023-06-19 DIAGNOSIS — I129 Hypertensive chronic kidney disease with stage 1 through stage 4 chronic kidney disease, or unspecified chronic kidney disease: Secondary | ICD-10-CM | POA: Diagnosis not present

## 2023-06-19 DIAGNOSIS — E213 Hyperparathyroidism, unspecified: Secondary | ICD-10-CM | POA: Diagnosis not present

## 2023-06-20 ENCOUNTER — Other Ambulatory Visit: Payer: Self-pay | Admitting: Nephrology

## 2023-06-20 DIAGNOSIS — N1831 Chronic kidney disease, stage 3a: Secondary | ICD-10-CM

## 2023-06-20 DIAGNOSIS — N200 Calculus of kidney: Secondary | ICD-10-CM

## 2023-06-22 ENCOUNTER — Ambulatory Visit
Admission: RE | Admit: 2023-06-22 | Discharge: 2023-06-22 | Disposition: A | Discharge status: Home / Self Care | Source: Ambulatory Visit | Attending: Nephrology | Admitting: Nephrology

## 2023-06-22 DIAGNOSIS — N1831 Chronic kidney disease, stage 3a: Secondary | ICD-10-CM

## 2023-06-22 DIAGNOSIS — N289 Disorder of kidney and ureter, unspecified: Secondary | ICD-10-CM | POA: Diagnosis not present

## 2023-06-22 DIAGNOSIS — N200 Calculus of kidney: Secondary | ICD-10-CM

## 2023-07-03 ENCOUNTER — Other Ambulatory Visit (HOSPITAL_COMMUNITY): Payer: Self-pay | Admitting: Nephrology

## 2023-07-03 DIAGNOSIS — E213 Hyperparathyroidism, unspecified: Secondary | ICD-10-CM

## 2023-07-13 ENCOUNTER — Ambulatory Visit (HOSPITAL_COMMUNITY)
Admission: RE | Admit: 2023-07-13 | Discharge: 2023-07-13 | Disposition: A | Discharge status: Home / Self Care | Source: Ambulatory Visit | Attending: Nephrology | Admitting: Nephrology

## 2023-07-13 ENCOUNTER — Encounter (HOSPITAL_COMMUNITY)
Admission: RE | Admit: 2023-07-13 | Discharge: 2023-07-13 | Disposition: A | Discharge status: Home / Self Care | Source: Ambulatory Visit | Attending: Nephrology | Admitting: Nephrology

## 2023-07-13 DIAGNOSIS — E213 Hyperparathyroidism, unspecified: Secondary | ICD-10-CM

## 2023-07-13 DIAGNOSIS — D34 Benign neoplasm of thyroid gland: Secondary | ICD-10-CM | POA: Diagnosis not present

## 2023-07-13 DIAGNOSIS — D119 Benign neoplasm of major salivary gland, unspecified: Secondary | ICD-10-CM | POA: Diagnosis not present

## 2023-07-13 DIAGNOSIS — D351 Benign neoplasm of parathyroid gland: Secondary | ICD-10-CM | POA: Diagnosis not present

## 2023-07-13 MED ORDER — TECHNETIUM TC 99M SESTAMIBI - CARDIOLITE
25.0000 | Freq: Once | INTRAVENOUS | Status: AC | PRN
  Administered 2023-07-13: 25 via INTRAVENOUS

## 2023-07-18 DIAGNOSIS — N3281 Overactive bladder: Secondary | ICD-10-CM | POA: Diagnosis not present

## 2023-07-18 DIAGNOSIS — I129 Hypertensive chronic kidney disease with stage 1 through stage 4 chronic kidney disease, or unspecified chronic kidney disease: Secondary | ICD-10-CM | POA: Diagnosis not present

## 2023-07-18 DIAGNOSIS — E669 Obesity, unspecified: Secondary | ICD-10-CM | POA: Diagnosis not present

## 2023-07-18 DIAGNOSIS — N183 Chronic kidney disease, stage 3 unspecified: Secondary | ICD-10-CM | POA: Diagnosis not present

## 2023-07-18 DIAGNOSIS — E039 Hypothyroidism, unspecified: Secondary | ICD-10-CM | POA: Diagnosis not present

## 2023-07-18 DIAGNOSIS — Z0000 Encounter for general adult medical examination without abnormal findings: Secondary | ICD-10-CM | POA: Diagnosis not present

## 2023-07-18 DIAGNOSIS — E7800 Pure hypercholesterolemia, unspecified: Secondary | ICD-10-CM | POA: Diagnosis not present

## 2023-07-18 DIAGNOSIS — Z23 Encounter for immunization: Secondary | ICD-10-CM | POA: Diagnosis not present

## 2023-07-18 DIAGNOSIS — M8588 Other specified disorders of bone density and structure, other site: Secondary | ICD-10-CM | POA: Diagnosis not present

## 2023-07-18 DIAGNOSIS — D351 Benign neoplasm of parathyroid gland: Secondary | ICD-10-CM | POA: Diagnosis not present

## 2023-07-18 DIAGNOSIS — Z1159 Encounter for screening for other viral diseases: Secondary | ICD-10-CM | POA: Diagnosis not present

## 2023-07-18 DIAGNOSIS — Z8639 Personal history of other endocrine, nutritional and metabolic disease: Secondary | ICD-10-CM | POA: Diagnosis not present

## 2023-07-18 DIAGNOSIS — F339 Major depressive disorder, recurrent, unspecified: Secondary | ICD-10-CM | POA: Diagnosis not present

## 2023-07-22 DIAGNOSIS — I959 Hypotension, unspecified: Secondary | ICD-10-CM | POA: Diagnosis not present

## 2023-07-22 DIAGNOSIS — N390 Urinary tract infection, site not specified: Secondary | ICD-10-CM | POA: Diagnosis not present

## 2023-07-22 DIAGNOSIS — R112 Nausea with vomiting, unspecified: Secondary | ICD-10-CM | POA: Diagnosis not present

## 2023-09-05 ENCOUNTER — Ambulatory Visit: Payer: Self-pay | Admitting: Surgery

## 2023-09-05 ENCOUNTER — Other Ambulatory Visit: Payer: Self-pay | Admitting: Surgery

## 2023-09-05 DIAGNOSIS — E210 Primary hyperparathyroidism: Secondary | ICD-10-CM | POA: Diagnosis not present

## 2023-09-06 ENCOUNTER — Ambulatory Visit
Admission: RE | Admit: 2023-09-06 | Discharge: 2023-09-06 | Disposition: A | Discharge status: Home / Self Care | Source: Ambulatory Visit | Attending: Surgery | Admitting: Surgery

## 2023-09-06 DIAGNOSIS — E210 Primary hyperparathyroidism: Secondary | ICD-10-CM

## 2023-09-06 DIAGNOSIS — E213 Hyperparathyroidism, unspecified: Secondary | ICD-10-CM | POA: Diagnosis not present

## 2023-09-11 NOTE — Patient Instructions (Signed)
 SURGICAL WAITING ROOM VISITATION  Patients having surgery or a procedure may have no more than 2 support people in the waiting area - these visitors may rotate.    Children under the age of 32 must have an adult with them who is not the patient.  Visitors with respiratory illnesses are discouraged from visiting and should remain at home.  If the patient needs to stay at the hospital during part of their recovery, the visitor guidelines for inpatient rooms apply. Pre-op nurse will coordinate an appropriate time for 1 support person to accompany patient in pre-op.  This support person may not rotate.    Please refer to the [facility] website for the visitor guidelines for Inpatients (after your surgery is over and you are in a regular room).       Your procedure is scheduled on:  09/14/2023    Report to [facility] Main Entrance    Report to admitting at   9:45 AM   Call this number if you have problems the morning of surgery [phone]   Do not eat food :After Midnight.   After Midnight you may have the following liquids until 0900 AM DAY OF SURGERY  Water Non-Citrus Juices (without pulp, NO RED-Apple, White grape, White cranberry) Black Coffee (NO MILK/CREAM OR CREAMERS, sugar ok)  Clear Tea (NO MILK/CREAM OR CREAMERS, sugar ok) regular and decaf                             Plain Jell-O (NO RED)                                           Fruit ices (not with fruit pulp, NO RED)                                     Popsicles (NO RED)                                                               Sports drinks like Gatorade (NO RED)                      If you have questions, please contact your surgeon's office.      Oral Hygiene is also important to reduce your risk of infection.                                    Remember - BRUSH YOUR TEETH THE MORNING OF SURGERY WITH YOUR REGULAR TOOTHPASTE  DENTURES WILL BE REMOVED PRIOR TO SURGERY PLEASE DO NOT APPLY Poly grip  OR ADHESIVES!!!   Do NOT smoke after Midnight   Stop all vitamins and herbal supplements 7 days before surgery.   Take these medicines the morning of surgery with A SIP OF WATER:  inhalers as usual and bring,  Synthroid , Ditropan, Atorvastatin             Zepbound- Hold until after surgery  DO NOT TAKE ANY ORAL DIABETIC MEDICATIONS DAY OF YOUR SURGERY                              You may not have any metal on your body including hair pins, jewelry, and body piercing             Do not wear make-up, lotions, powders, perfumes/cologne, or deodorant  Do not wear nail polish including gel and S&S, artificial/acrylic nails, or any other type of covering on natural nails including finger and toenails. If you have artificial nails, gel coating, etc. that needs to be removed by a nail salon please have this removed prior to surgery or surgery may need to be canceled/ delayed if the surgeon/ anesthesia feels like they are unable to be safely monitored.   Do not shave  48 hours prior to surgery.               Men may shave face and neck.   Do not bring valuables to the hospital. [facility] IS NOT             RESPONSIBLE   FOR VALUABLES.   Contacts, glasses, dentures or bridgework may not be worn into surgery.  DO NOT BRING YOUR HOME MEDICATIONS TO THE HOSPITAL. PHARMACY WILL DISPENSE MEDICATIONS LISTED ON YOUR MEDICATION LIST TO YOU DURING YOUR ADMISSION IN THE HOSPITAL!    Patients discharged on the day of surgery will not be allowed to drive home.  Someone NEEDS to stay with you for the first 24 hours after anesthesia.   Special Instructions: Bring a copy of your healthcare power of attorney and living will documents the day of surgery if you haven't scanned them before.              Please read over the following fact sheets you were given: IF YOU HAVE QUESTIONS ABOUT YOUR PRE-OP INSTRUCTIONS PLEASE CALL [phone].   If you received a COVID test during your pre-op visit  it is  requested that you wear a mask when out in public, stay away from anyone that may not be feeling well and notify your surgeon if you develop symptoms. If you test positive for Covid or have been in contact with anyone that has tested positive in the last 10 days please notify you surgeon.    [facility] - Preparing for Surgery Before surgery, you can play an important role.  Because skin is not sterile, your skin needs to be as free of germs as possible.  You can reduce the number of germs on your skin by washing with CHG (chlorahexidine gluconate) soap before surgery.  CHG is an antiseptic cleaner which kills germs and bonds with the skin to continue killing germs even after washing. Please DO NOT use if you have an allergy to CHG or antibacterial soaps.  If your skin becomes reddened/irritated stop using the CHG and inform your nurse when you arrive at Short Stay. Do not shave (including legs and underarms) for at least 48 hours prior to the first CHG shower.  You may shave your face/neck. Please follow these instructions carefully:  1.  Shower with CHG Soap the night before surgery and the  morning of Surgery.  2.  If you choose to wash your hair, wash your hair first as usual with your  normal  shampoo.  3.  After you shampoo, rinse your hair and body thoroughly to  remove the  shampoo.                           4.  Use CHG as you would any other liquid soap.  You can apply chg directly  to the skin and wash                       Gently with a scrungie or clean washcloth.  5.  Apply the CHG Soap to your body ONLY FROM THE NECK DOWN.   Do not use on face/ open                           Wound or open sores. Avoid contact with eyes, ears mouth and genitals (private parts).                       Wash face,  Genitals (private parts) with your normal soap.             6.  Wash thoroughly, paying special attention to the area where your surgery  will be performed.  7.  Thoroughly rinse your body with warm  water from the neck down.  8.  DO NOT shower/wash with your normal soap after using and rinsing off  the CHG Soap.                9.  Pat yourself dry with a clean towel.            10.  Wear clean pajamas.            11.  Place clean sheets on your bed the night of your first shower and do not  sleep with pets. Day of Surgery : Do not apply any lotions/deodorants the morning of surgery.  Please wear clean clothes to the hospital/surgery center.  FAILURE TO FOLLOW THESE INSTRUCTIONS MAY RESULT IN THE CANCELLATION OF YOUR SURGERY PATIENT SIGNATURE_________________________________  NURSE SIGNATURE__________________________________  ________________________________________________________________________

## 2023-09-11 NOTE — Progress Notes (Signed)
 Anesthesia Review:  PCP: Montie white  Cardiologist :  PPM/ ICD: Device Orders: Rep Notified:  Chest x-ray : EKG : 01/11/23  Echo : 2021  Stress test: Cardiac Cath :   Activity level:  Sleep Study/ CPAP : Fasting Blood Sugar :      / Checks Blood Sugar -- times a day:    Blood Thinner/ Instructions /Last Dose: ASA / Instructions/ Last Dose :    Wegovy - last dose on

## 2023-09-12 ENCOUNTER — Ambulatory Visit: Payer: Self-pay | Admitting: Surgery

## 2023-09-12 NOTE — Progress Notes (Signed)
 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  Patient is a good candidate for outpatient minimally invasive parathyroid  surgery as we discussed in the office.  Will enter orders and send to schedulers to contact the patient.  Krystal Spinner, MD [facility] practice Office: [phone]

## 2023-09-13 ENCOUNTER — Encounter (HOSPITAL_COMMUNITY): Payer: Self-pay | Admitting: Surgery

## 2023-09-13 ENCOUNTER — Encounter (HOSPITAL_COMMUNITY)
Admission: RE | Admit: 2023-09-13 | Discharge: 2023-09-13 | Disposition: A | Discharge status: Home / Self Care | Source: Ambulatory Visit | Attending: Surgery | Admitting: Surgery

## 2023-09-13 ENCOUNTER — Other Ambulatory Visit: Payer: Self-pay

## 2023-09-13 ENCOUNTER — Encounter (HOSPITAL_COMMUNITY): Payer: Self-pay

## 2023-09-13 VITALS — BP 129/78 | HR 95 | Temp 98.2°F | Resp 18 | Ht 64.0 in | Wt 169.7 lb

## 2023-09-13 DIAGNOSIS — Z01812 Encounter for preprocedural laboratory examination: Secondary | ICD-10-CM | POA: Diagnosis not present

## 2023-09-13 DIAGNOSIS — K759 Inflammatory liver disease, unspecified: Secondary | ICD-10-CM | POA: Insufficient documentation

## 2023-09-13 DIAGNOSIS — Z01818 Encounter for other preprocedural examination: Secondary | ICD-10-CM

## 2023-09-13 LAB — CBC
HCT: 41.8 % (ref 36.0–46.0)
Hemoglobin: 13.6 g/dL (ref 12.0–15.0)
MCH: 28.7 pg (ref 26.0–34.0)
MCHC: 32.5 g/dL (ref 30.0–36.0)
MCV: 88.2 fL (ref 80.0–100.0)
Platelets: 301 K/uL (ref 150–400)
RBC: 4.74 MIL/uL (ref 3.87–5.11)
RDW: 14 % (ref 11.5–15.5)
WBC: 8.4 K/uL (ref 4.0–10.5)
nRBC: 0 % (ref 0.0–0.2)

## 2023-09-13 LAB — COMPREHENSIVE METABOLIC PANEL WITH GFR
ALT: 20 U/L (ref 0–44)
AST: 18 U/L (ref 15–41)
Albumin: 3.6 g/dL (ref 3.5–5.0)
Alkaline Phosphatase: 100 U/L (ref 38–126)
Anion gap: 6 (ref 5–15)
BUN: 15 mg/dL (ref 8–23)
CO2: 30 mmol/L (ref 22–32)
Calcium: 10 mg/dL (ref 8.9–10.3)
Chloride: 102 mmol/L (ref 98–111)
Creatinine, Ser: 1.23 mg/dL — ABNORMAL HIGH (ref 0.44–1.00)
GFR, Estimated: 46 mL/min — ABNORMAL LOW (ref >60)
Glucose, Bld: 121 mg/dL — ABNORMAL HIGH (ref 70–99)
Potassium: 3.4 mmol/L — ABNORMAL LOW (ref 3.5–5.1)
Sodium: 138 mmol/L (ref 135–145)
Total Bilirubin: 0.8 mg/dL (ref 0.0–1.2)
Total Protein: 7.2 g/dL (ref 6.5–8.1)

## 2023-09-13 NOTE — Progress Notes (Signed)
 COVID Vaccine Completed:  Date of COVID positive in last 90 days: No  PCP - Montie Pizza, MD Cardiologist - N/A  Chest x-ray - N/A EKG - 01-11-23 Epic Stress Test - N/A ECHO - 2021 Cardiac Cath - N/A Pacemaker/ICD device last checked:N/A Spinal Cord Stimulator:N/A  Bowel Prep - N/A  Sleep Study - N/A CPAP -   Fasting Blood Sugar - N/A Checks Blood Sugar _____ times a day  Zepbound for weight loss Last dose of GLP1 agonist-  08-27-23 GLP1 instructions:  Hold until after surgery      Last dose of SGLT-2 inhibitors-  N/A SGLT-2 instructions:  Do not take after    Blood Thinner Instructions:  N/A Aspirin Instructions: Last Dose:  Activity level:  Can go up a flight of stairs and perform activities of daily living without stopping and without symptoms of chest pain or shortness of breath.  Able to exercise without symptoms  Anesthesia review: N/A  Patient denies shortness of breath, fever, cough and chest pain at PAT appointment  Patient verbalized understanding of instructions that were given to them at the PAT appointment. Patient was also instructed that they will need to review over the PAT instructions again at home before surgery.

## 2023-09-13 NOTE — H&P (Signed)
 REFERRING PHYSICIAN: Jerrye Katheryn Craven, MD  PROVIDER: Treshon Stannard OZELL SPINNER, MD   Chief Complaint: New Consultation (Primary hyperparathyroidism)  History of Present Illness:  Patient is referred by Dr. Katheryn Jerrye for surgical evaluation and management of newly diagnosed primary hyperparathyroidism. Patient's primary care physician is Dr. Montie Pizza. Patient was noted on routine laboratory studies to have an elevated serum calcium level. Her most recent level was 10.8. Patient developed nephrolithiasis. Intact PTH level was elevated at 100. 25-hydroxy vitamin D level was normal at 48. Patient underwent a nuclear medicine parathyroid  scan with sestamibi in May 2025. This localized a right inferior parathyroid  adenoma. Patient has no prior history of head or neck surgery. There is no family history of parathyroid  disease or other endocrine neoplasm. Patient has not had any additional imaging studies. She presents today to discuss surgical management.  Review of Systems: A complete review of systems was obtained from the patient. I have reviewed this information and discussed as appropriate with the patient. See HPI as well for other ROS.  Review of Systems  Constitutional: Positive for malaise/fatigue.  HENT: Negative.  Eyes: Negative.  Respiratory: Negative.  Cardiovascular: Negative.  Gastrointestinal: Negative.  Genitourinary:  Nephrolithiasis  Musculoskeletal: Negative.  Skin: Negative.  Neurological: Negative.  Endo/Heme/Allergies: Negative.  Psychiatric/Behavioral: Negative.    Medical History: Past Medical History:  Diagnosis Date  Chronic kidney disease  GERD (gastroesophageal reflux disease)  Hypertension  Thyroid  disease   There is no problem list on file for this patient.  Past Surgical History:  Procedure Laterality Date  .carpal tunnel 2010  REPLACEMENT TOTAL KNEE Left 2013  plantar fascittis 2014  embolization kidney Right 2018  finger joint  fusion Right 2019  MYRINGOTOMY Left 2023    No Known Allergies  No current outpatient medications on file prior to visit.   No current facility-administered medications on file prior to visit.   Family History  Problem Relation Age of Onset  High blood pressure (Hypertension) Mother  Breast cancer Sister    Social History   Tobacco Use  Smoking Status Never  Smokeless Tobacco Never    Social History   Socioeconomic History  Marital status: Married  Tobacco Use  Smoking status: Never  Smokeless tobacco: Never  Vaping Use  Vaping status: Never Used  Substance and Sexual Activity  Alcohol use: Never  Drug use: Never  Sexual activity: Defer   Social Drivers of Health   Housing Stability: Unknown (09/05/2023)  Housing Stability Vital Sign  Homeless in the Last Year: No   Objective:   Vitals:  BP: 126/79  Pulse: 97  Temp: 36.7 C (98 F)  TempSrc: Temporal  SpO2: 99%  Weight: 77 kg (169 lb 12.8 oz)  Height: 162.6 cm (5' 4)   Body mass index is 29.15 kg/m.  Physical Exam   GENERAL APPEARANCE Comfortable, no acute issues Development: normal Gross deformities: none  SKIN Rash, lesions, ulcers: none Induration, erythema: none Nodules: none palpable  EYES Conjunctiva and lids: normal Pupils: equal  EARS, NOSE, MOUTH, THROAT External ears: no lesion or deformity External nose: no lesion or deformity Hearing: grossly normal  NECK Symmetric: yes Trachea: midline Thyroid : no palpable nodules in the thyroid  bed  CHEST/CV Not assessed  ABDOMEN Not assessed  GENITOURINARY/RECTAL Not assessed  MUSCULOSKELETAL Station and gait: normal Digits and nails: no clubbing or cyanosis Muscle strength: grossly normal all extremities Deformity: none  LYMPHATIC Cervical: none palpable Supraclavicular: none palpable  PSYCHIATRIC Oriented to person, place, and  time: yes Mood and affect: normal for situation Judgment and insight: appropriate for  situation   Assessment and Plan:   Primary hyperparathyroidism (CMS/HHS-HCC)  Patient is referred by her nephrologist for surgical evaluation and management of newly diagnosed primary hyperparathyroidism.  Patient provided with a copy of Parathyroid  Surgery: Treatment for Your Parathyroid  Gland Problem, published by [employer], 12 pages. Book reviewed and explained to patient during visit today.  Today we reviewed her clinical history. We reviewed her recent laboratory studies. We reviewed her nuclear medicine parathyroid  scan. I would like to obtain an ultrasound examination of the neck in hopes of confirming the location of her adenoma and to rule out multi gland disease and also to rule out any concurrent thyroid  disease. We will make arrangements for that study in the near future. Otherwise, we will plan to proceed with minimally invasive parathyroidectomy as an outpatient surgical procedure. We discussed the size and location of the surgical incision. We discussed the risk and benefits of surgery including the risk of recurrent laryngeal nerve injury. We discussed the postoperative recovery to be anticipated. The patient understands and wishes to proceed.  We will make arrangements for an ultrasound examination prior to surgery. We will have the surgical schedulers contact the patient to select a date for surgery in the near future.   Krystal Spinner, MD [facility] practice Office: [phone]

## 2023-09-14 ENCOUNTER — Ambulatory Visit (HOSPITAL_COMMUNITY): Admitting: Anesthesiology

## 2023-09-14 ENCOUNTER — Encounter (HOSPITAL_COMMUNITY): Payer: Self-pay | Admitting: Medical

## 2023-09-14 ENCOUNTER — Encounter (HOSPITAL_COMMUNITY): Payer: Self-pay | Admitting: Surgery

## 2023-09-14 ENCOUNTER — Ambulatory Visit (HOSPITAL_COMMUNITY)
Admission: RE | Admit: 2023-09-14 | Discharge: 2023-09-14 | Disposition: A | Discharge status: Home / Self Care | Attending: Surgery | Admitting: Surgery

## 2023-09-14 ENCOUNTER — Encounter (HOSPITAL_COMMUNITY)
Admission: RE | Disposition: A | Discharge status: Home / Self Care | Payer: Self-pay | Source: Home / Self Care | Attending: Surgery

## 2023-09-14 DIAGNOSIS — E210 Primary hyperparathyroidism: Secondary | ICD-10-CM

## 2023-09-14 DIAGNOSIS — K759 Inflammatory liver disease, unspecified: Secondary | ICD-10-CM | POA: Diagnosis not present

## 2023-09-14 DIAGNOSIS — D351 Benign neoplasm of parathyroid gland: Secondary | ICD-10-CM | POA: Insufficient documentation

## 2023-09-14 DIAGNOSIS — I10 Essential (primary) hypertension: Secondary | ICD-10-CM

## 2023-09-14 DIAGNOSIS — N200 Calculus of kidney: Secondary | ICD-10-CM | POA: Insufficient documentation

## 2023-09-14 DIAGNOSIS — F32A Depression, unspecified: Secondary | ICD-10-CM

## 2023-09-14 DIAGNOSIS — K219 Gastro-esophageal reflux disease without esophagitis: Secondary | ICD-10-CM | POA: Diagnosis not present

## 2023-09-14 DIAGNOSIS — E039 Hypothyroidism, unspecified: Secondary | ICD-10-CM

## 2023-09-14 HISTORY — PX: PARATHYROIDECTOMY: SHX19

## 2023-09-14 SURGERY — PARATHYROIDECTOMY
Anesthesia: General | Site: Neck | Laterality: Right

## 2023-09-14 MED ORDER — CHLORHEXIDINE GLUCONATE 0.12 % MT SOLN
15.0000 mL | Freq: Once | OROMUCOSAL | Status: AC
  Administered 2023-09-14: 15 mL via OROMUCOSAL

## 2023-09-14 MED ORDER — CHLORHEXIDINE GLUCONATE CLOTH 2 % EX PADS: 6.0000 | MEDICATED_PAD | Freq: Once | CUTANEOUS | Status: DC

## 2023-09-14 MED ORDER — OXYCODONE HCL 5 MG PO TABS
ORAL_TABLET | ORAL | Status: AC
Start: 2023-09-14 — End: 2023-09-14
  Filled 2023-09-14: qty 1

## 2023-09-14 MED ORDER — OXYCODONE HCL 5 MG/5ML PO SOLN: 5.0000 mg | Freq: Once | ORAL | Status: AC | PRN

## 2023-09-14 MED ORDER — DEXMEDETOMIDINE HCL IN NACL 80 MCG/20ML IV SOLN
INTRAVENOUS | Status: DC | PRN
  Administered 2023-09-14: 8 ug via INTRAVENOUS

## 2023-09-14 MED ORDER — TRAMADOL HCL 50 MG PO TABS: 50.0000 mg | ORAL_TABLET | Freq: Four times a day (QID) | ORAL | 0 refills | Status: AC | PRN

## 2023-09-14 MED ORDER — FENTANYL CITRATE (PF) 100 MCG/2ML IJ SOLN
INTRAMUSCULAR | Status: AC
  Filled 2023-09-14: qty 2

## 2023-09-14 MED ORDER — FENTANYL CITRATE PF 50 MCG/ML IJ SOSY
25.0000 ug | PREFILLED_SYRINGE | INTRAMUSCULAR | Status: DC | PRN
  Administered 2023-09-14 (×2): 25 ug via INTRAVENOUS

## 2023-09-14 MED ORDER — DEXAMETHASONE SODIUM PHOSPHATE 10 MG/ML IJ SOLN
INTRAMUSCULAR | Status: AC
  Filled 2023-09-14: qty 1

## 2023-09-14 MED ORDER — BUPIVACAINE-EPINEPHRINE (PF) 0.25% -1:200000 IJ SOLN
INTRAMUSCULAR | Status: AC
  Filled 2023-09-14: qty 30

## 2023-09-14 MED ORDER — CEFAZOLIN SODIUM-DEXTROSE 2-4 GM/100ML-% IV SOLN
2.0000 g | INTRAVENOUS | Status: AC
  Administered 2023-09-14: 2 g via INTRAVENOUS
  Filled 2023-09-14: qty 100

## 2023-09-14 MED ORDER — ONDANSETRON HCL 4 MG/2ML IJ SOLN: 4.0000 mg | Freq: Once | INTRAMUSCULAR | Status: DC | PRN

## 2023-09-14 MED ORDER — BUPIVACAINE HCL (PF) 0.25 % IJ SOLN
INTRAMUSCULAR | Status: AC
  Filled 2023-09-14: qty 30

## 2023-09-14 MED ORDER — ONDANSETRON HCL 4 MG/2ML IJ SOLN
INTRAMUSCULAR | Status: AC
  Filled 2023-09-14: qty 2

## 2023-09-14 MED ORDER — BUPIVACAINE HCL 0.25 % IJ SOLN
INTRAMUSCULAR | Status: DC | PRN
  Administered 2023-09-14: 7 mL

## 2023-09-14 MED ORDER — 0.9 % SODIUM CHLORIDE (POUR BTL) OPTIME
TOPICAL | Status: DC | PRN
  Administered 2023-09-14: 1000 mL

## 2023-09-14 MED ORDER — HEMOSTATIC AGENTS (NO CHARGE) OPTIME
TOPICAL | Status: DC | PRN
  Administered 2023-09-14: 1 via TOPICAL

## 2023-09-14 MED ORDER — OXYCODONE HCL 5 MG PO TABS
5.0000 mg | ORAL_TABLET | Freq: Once | ORAL | Status: AC | PRN
  Administered 2023-09-14: 5 mg via ORAL

## 2023-09-14 MED ORDER — PROPOFOL 10 MG/ML IV BOLUS
INTRAVENOUS | Status: DC | PRN
  Administered 2023-09-14: 200 mg via INTRAVENOUS

## 2023-09-14 MED ORDER — SUGAMMADEX SODIUM 200 MG/2ML IV SOLN
INTRAVENOUS | Status: DC | PRN
  Administered 2023-09-14: 200 mg via INTRAVENOUS

## 2023-09-14 MED ORDER — FENTANYL CITRATE PF 50 MCG/ML IJ SOSY
PREFILLED_SYRINGE | INTRAMUSCULAR | Status: AC
Start: 2023-09-14 — End: 2023-09-14
  Filled 2023-09-14: qty 1

## 2023-09-14 MED ORDER — PHENYLEPHRINE 80 MCG/ML (10ML) SYRINGE FOR IV PUSH (FOR BLOOD PRESSURE SUPPORT)
PREFILLED_SYRINGE | INTRAVENOUS | Status: AC
  Filled 2023-09-14: qty 10

## 2023-09-14 MED ORDER — FENTANYL CITRATE (PF) 100 MCG/2ML IJ SOLN
INTRAMUSCULAR | Status: DC | PRN
  Administered 2023-09-14 (×3): 50 ug via INTRAVENOUS

## 2023-09-14 MED ORDER — ROCURONIUM BROMIDE 100 MG/10ML IV SOLN
INTRAVENOUS | Status: DC | PRN
  Administered 2023-09-14: 50 mg via INTRAVENOUS

## 2023-09-14 MED ORDER — ACETAMINOPHEN 500 MG PO TABS
1000.0000 mg | ORAL_TABLET | Freq: Once | ORAL | Status: AC
  Administered 2023-09-14: 1000 mg via ORAL
  Filled 2023-09-14: qty 2

## 2023-09-14 MED ORDER — ONDANSETRON HCL 4 MG/2ML IJ SOLN
INTRAMUSCULAR | Status: DC | PRN
  Administered 2023-09-14: 4 mg via INTRAVENOUS

## 2023-09-14 MED ORDER — DEXAMETHASONE SODIUM PHOSPHATE 4 MG/ML IJ SOLN
INTRAMUSCULAR | Status: DC | PRN
  Administered 2023-09-14: 5 mg via INTRAVENOUS

## 2023-09-14 MED ORDER — LACTATED RINGERS IV SOLN: INTRAVENOUS | Status: DC | PRN

## 2023-09-14 MED ORDER — LACTATED RINGERS IV SOLN: INTRAVENOUS | Status: DC

## 2023-09-14 MED ORDER — LIDOCAINE HCL (CARDIAC) PF 100 MG/5ML IV SOSY
PREFILLED_SYRINGE | INTRAVENOUS | Status: DC | PRN
  Administered 2023-09-14: 80 mg via INTRAVENOUS

## 2023-09-14 MED ORDER — PROPOFOL 10 MG/ML IV BOLUS
INTRAVENOUS | Status: AC
  Filled 2023-09-14: qty 20

## 2023-09-14 SURGICAL SUPPLY — 29 items
ATTRACTOMAT 16X20 MAGNETIC DRP (DRAPES) ×2 IMPLANT
BAG COUNTER SPONGE SURGICOUNT (BAG) ×2 IMPLANT
BLADE SURG 15 STRL LF DISP TIS (BLADE) ×2 IMPLANT
CHLORAPREP W/TINT 26 (MISCELLANEOUS) ×2 IMPLANT
CLIP TI MEDIUM 6 (CLIP) ×4 IMPLANT
CLIP TI WIDE RED SMALL 6 (CLIP) ×4 IMPLANT
COVER SURGICAL LIGHT HANDLE (MISCELLANEOUS) ×2 IMPLANT
DERMABOND ADVANCED .7 DNX12 (GAUZE/BANDAGES/DRESSINGS) ×2 IMPLANT
DRAPE LAPAROTOMY T 98X78 PEDS (DRAPES) ×2 IMPLANT
DRAPE UTILITY XL STRL (DRAPES) ×2 IMPLANT
ELECT PENCIL ROCKER SW 15FT (MISCELLANEOUS) ×2 IMPLANT
ELECT REM PT RETURN 15FT ADLT (MISCELLANEOUS) ×2 IMPLANT
GAUZE 4X4 16PLY ~~LOC~~+RFID DBL (SPONGE) ×2 IMPLANT
GLOVE SURG ORTHO 8.0 STRL STRW (GLOVE) ×2 IMPLANT
GOWN STRL REUS W/ TWL XL LVL3 (GOWN DISPOSABLE) ×6 IMPLANT
HEMOSTAT SURGICEL 2X4 FIBR (HEMOSTASIS) ×2 IMPLANT
ILLUMINATOR WAVEGUIDE N/F (MISCELLANEOUS) IMPLANT
KIT BASIN OR (CUSTOM PROCEDURE TRAY) ×2 IMPLANT
KIT TURNOVER KIT A (KITS) ×2 IMPLANT
NDL HYPO 22X1.5 SAFETY MO (MISCELLANEOUS) ×2 IMPLANT
NEEDLE HYPO 22X1.5 SAFETY MO (MISCELLANEOUS) ×1 IMPLANT
PACK BASIC VI WITH GOWN DISP (CUSTOM PROCEDURE TRAY) ×2 IMPLANT
SHEARS HARMONIC 9CM CVD (BLADE) IMPLANT
SUT MNCRL AB 4-0 PS2 18 (SUTURE) ×2 IMPLANT
SUT VIC AB 3-0 SH 18 (SUTURE) ×2 IMPLANT
SYR BULB IRRIG 60ML STRL (SYRINGE) ×2 IMPLANT
SYR CONTROL 10ML LL (SYRINGE) ×2 IMPLANT
TOWEL OR 17X26 10 PK STRL BLUE (TOWEL DISPOSABLE) ×2 IMPLANT
TUBING CONNECTING 10 (TUBING) ×2 IMPLANT

## 2023-09-14 NOTE — Anesthesia Procedure Notes (Signed)
 Procedure Name: Intubation Date/Time: 09/14/2023 11:31 AM  Performed by: Dartha Meckel, CRNAPre-anesthesia Checklist: Patient identified, Emergency Drugs available, Suction available and Patient being monitored Patient Re-evaluated:Patient Re-evaluated prior to induction Oxygen Delivery Method: Circle system utilized Preoxygenation: Pre-oxygenation with 100% oxygen Induction Type: IV induction Ventilation: Mask ventilation without difficulty Laryngoscope Size: Mac and 3 Tube type: Oral Tube size: 7.0 mm Number of attempts: 1 Airway Equipment and Method: Stylet and Oral airway Placement Confirmation: ETT inserted through vocal cords under direct vision, positive ETCO2 and breath sounds checked- equal and bilateral Secured at: 21 cm Tube secured with: Tape Dental Injury: Teeth and Oropharynx as per pre-operative assessment

## 2023-09-14 NOTE — Anesthesia Postprocedure Evaluation (Signed)
 Anesthesia Post Note  Patient: Sierra Banks  Procedure(s) Performed: PARATHYROIDECTOMY (Right: Neck)     Patient location during evaluation: PACU Anesthesia Type: General Level of consciousness: awake and alert Pain management: pain level controlled Vital Signs Assessment: post-procedure vital signs reviewed and stable Respiratory status: spontaneous breathing, nonlabored ventilation and respiratory function stable Cardiovascular status: stable and blood pressure returned to baseline Anesthetic complications: no   No notable events documented.  Last Vitals:  Vitals:   09/14/23 1430 09/14/23 1445  BP: (!) 161/90 (!) 157/85  Pulse: 88 92  Resp: 10   Temp:    SpO2: 91% 92%    Last Pain:  Vitals:   09/14/23 1445  TempSrc:   PainSc: 4                  Debby FORBES Like

## 2023-09-14 NOTE — Discharge Instructions (Addendum)

## 2023-09-14 NOTE — Anesthesia Preprocedure Evaluation (Addendum)
 Anesthesia Evaluation  Patient identified by MRN, date of birth, ID band Patient awake    Reviewed: Allergy & Precautions, NPO status , Patient's Chart, lab work & pertinent test results  History of Anesthesia Complications Negative for: history of anesthetic complications  Airway Mallampati: II  TM Distance: >3 FB Neck ROM: Full    Dental  (+) Dental Advisory Given   Pulmonary neg pulmonary ROS   Pulmonary exam normal        Cardiovascular hypertension, Pt. on medications Normal cardiovascular exam     Neuro/Psych  PSYCHIATRIC DISORDERS  Depression    negative neurological ROS     GI/Hepatic ,GERD  Controlled,,(+) Hepatitis -  Endo/Other  Hypothyroidism   Primary hyperparathyroidism   Renal/GU Renal InsufficiencyRenal disease     Musculoskeletal negative musculoskeletal ROS (+)    Abdominal   Peds  Hematology negative hematology ROS (+)   Anesthesia Other Findings On GLP-1a HSV  Reproductive/Obstetrics                              Anesthesia Physical Anesthesia Plan  ASA: 3  Anesthesia Plan: General   Post-op Pain Management: Tylenol  PO (pre-op)*   Induction: Intravenous  PONV Risk Score and Plan: 3 and Treatment may vary due to age or medical condition, Ondansetron  and Dexamethasone   Airway Management Planned: Oral ETT  Additional Equipment: None  Intra-op Plan:   Post-operative Plan: Extubation in OR  Informed Consent: I have reviewed the patients History and Physical, chart, labs and discussed the procedure including the risks, benefits and alternatives for the proposed anesthesia with the patient or authorized representative who has indicated his/her understanding and acceptance.     Dental advisory given  Plan Discussed with: CRNA and Anesthesiologist  Anesthesia Plan Comments:          Anesthesia Quick Evaluation

## 2023-09-14 NOTE — Interval H&P Note (Signed)
 History and Physical Interval Note:  09/14/2023 11:02 AM  Sierra Banks  has presented today for surgery, with the diagnosis of PRIMARY HYPERPARATHYROIDISM.  The various methods of treatment have been discussed with the patient and family. After consideration of risks, benefits and other options for treatment, the patient has consented to    Procedure(s): PARATHYROIDECTOMY (Right) as a surgical intervention.    The patient's history has been reviewed, patient examined, no change in status, stable for surgery.  I have reviewed the patient's chart and labs.  Questions were answered to the patient's satisfaction.    Krystal Spinner, MD [facility] practice Office: [phone]   Krystal Spinner

## 2023-09-14 NOTE — Transfer of Care (Signed)
 Immediate Anesthesia Transfer of Care Note  Patient: PLACIDA CAMBRE  Procedure(s) Performed: PARATHYROIDECTOMY (Right: Neck)  Patient Location: PACU  Anesthesia Type:General  Level of Consciousness: awake and alert   Airway & Oxygen Therapy: Patient Spontanous Breathing and Patient connected to nasal cannula oxygen  Post-op Assessment: Report given to RN and Post -op Vital signs reviewed and stable  Post vital signs: Reviewed and stable  Last Vitals:  Vitals Value Taken Time  BP    Temp    Pulse 84 09/14/23 12:54  Resp 8 09/14/23 12:54  SpO2 97 % 09/14/23 12:54  Vitals shown include unfiled device data.  Last Pain:  Vitals:   09/14/23 1026  TempSrc:   PainSc: 0-No pain         Complications: No notable events documented.

## 2023-09-14 NOTE — Op Note (Signed)
 OPERATIVE REPORT - PARATHYROIDECTOMY  Preoperative diagnosis: Primary hyperparathyroidism  Postop diagnosis: Same  Procedure: Right inferior minimally invasive parathyroidectomy  Surgeon:  Krystal Spinner, MD  Anesthesia: General endotracheal  Estimated blood loss: Minimal  Preparation: ChloraPrep  Indications: Patient is referred by Dr. Katheryn Saba for surgical evaluation and management of newly diagnosed primary hyperparathyroidism. Patient's primary care physician is Dr. Montie Pizza. Patient was noted on routine laboratory studies to have an elevated serum calcium level. Her most recent level was 10.8. Patient developed nephrolithiasis. Intact PTH level was elevated at 100. 25-hydroxy vitamin D level was normal at 48. Patient underwent a nuclear medicine parathyroid  scan with sestamibi in May 2025. This localized a right inferior parathyroid  adenoma. Patient has no prior history of head or neck surgery. There is no family history of parathyroid  disease or other endocrine neoplasm. Patient has not had any additional imaging studies. She presents today to discuss surgical management.   Procedure: The patient was prepared in the pre-operative holding area. The patient was brought to the operating room and placed in a supine position on the operating room table. Following administration of general anesthesia, the patient was positioned and then prepped and draped in the usual strict aseptic fashion. After ascertaining that an adequate level of anesthesia been achieved, a neck incision was made with a #15 blade. Dissection was carried through subcutaneous tissues and platysma. Hemostasis was obtained with the electrocautery. Skin flaps were developed circumferentially and a Weitlander retractor was placed for exposure.  Strap muscles were incised in the midline. Strap muscles were reflected laterally exposing the right thyroid  lobe. With gentle blunt dissection the thyroid  lobe was mobilized.   Dissection was carried posteriorly and an enlarged parathyroid  gland was identified. It was gently mobilized. Vascular structures were divided between small ligaclips. Care was taken to avoid the recurrent laryngeal nerve. The parathyroid  gland was completely excised. It was submitted to pathology where frozen section confirmed hypercellular parathyroid  tissue consistent with adenoma.  Neck was irrigated with warm saline and good hemostasis was noted. Fibrillar was placed in the operative field. Strap muscles were approximated in the midline with interrupted 3-0 Vicryl sutures. Platysma was closed with interrupted 3-0 Vicryl sutures. Marcaine  was infiltrated circumferentially. Skin was closed with a running 4-0 Monocryl subcuticular suture. Wound was washed and dried and Dermabond was applied. Patient was awakened from anesthesia and brought to the recovery room. The patient tolerated the procedure well.   Krystal Spinner, MD [facility] Surgery Office: [phone]

## 2023-09-15 ENCOUNTER — Encounter (HOSPITAL_COMMUNITY): Payer: Self-pay | Admitting: Surgery

## 2023-09-18 LAB — SURGICAL PATHOLOGY

## 2023-10-02 DIAGNOSIS — Z9889 Other specified postprocedural states: Secondary | ICD-10-CM | POA: Diagnosis not present

## 2023-10-02 DIAGNOSIS — Z9089 Acquired absence of other organs: Secondary | ICD-10-CM | POA: Diagnosis not present

## 2023-10-02 DIAGNOSIS — E210 Primary hyperparathyroidism: Secondary | ICD-10-CM | POA: Diagnosis not present

## 2023-10-03 DIAGNOSIS — Z9889 Other specified postprocedural states: Secondary | ICD-10-CM | POA: Diagnosis not present

## 2023-10-03 DIAGNOSIS — I129 Hypertensive chronic kidney disease with stage 1 through stage 4 chronic kidney disease, or unspecified chronic kidney disease: Secondary | ICD-10-CM | POA: Diagnosis not present

## 2023-10-03 DIAGNOSIS — Z9089 Acquired absence of other organs: Secondary | ICD-10-CM | POA: Diagnosis not present

## 2023-10-03 DIAGNOSIS — N183 Chronic kidney disease, stage 3 unspecified: Secondary | ICD-10-CM | POA: Diagnosis not present

## 2023-10-03 DIAGNOSIS — F339 Major depressive disorder, recurrent, unspecified: Secondary | ICD-10-CM | POA: Diagnosis not present

## 2023-10-03 DIAGNOSIS — G253 Myoclonus: Secondary | ICD-10-CM | POA: Diagnosis not present

## 2023-10-08 DIAGNOSIS — R110 Nausea: Secondary | ICD-10-CM | POA: Diagnosis not present

## 2023-10-08 DIAGNOSIS — R42 Dizziness and giddiness: Secondary | ICD-10-CM | POA: Diagnosis not present

## 2023-10-08 DIAGNOSIS — Z9181 History of falling: Secondary | ICD-10-CM | POA: Diagnosis not present

## 2023-10-08 NOTE — ED Provider Notes (Addendum)
 Chief Complaint  Patient presents with  . Dizziness  . Nausea       HPI Chief complaint: Dizziness/nausea    HPI: Patient presents to the emergency room with a 1 week history of dizziness and nausea.  She states that she has difficulty standing and frequently nearly falls.  She has had this in the past.  Usually they give her IV fluids and this corrects this problem.  Patient has a history of dizziness, hypertension, elevated cholesterol, hypothyroidism, hyperparathyroidism, and right knee replacement.      Patient History Medical History[1] Surgical History[2] Family History[3] Social History[4]    Review of Systems Review of Systems  Constitutional: Negative.  Negative for chills, diaphoresis, fatigue and fever.  HENT: Negative.    Eyes: Negative.   Respiratory: Negative.  Negative for chest tightness and shortness of breath.   Cardiovascular: Negative.  Negative for chest pain.  Gastrointestinal:  Positive for nausea. Negative for abdominal pain, diarrhea and vomiting.  Endocrine: Negative.   Genitourinary: Negative.   Musculoskeletal: Negative.  Negative for back pain, myalgias and neck pain.  Skin: Negative.   Allergic/Immunologic: Negative.   Neurological:  Positive for dizziness.  Hematological: Negative.   Psychiatric/Behavioral: Negative.    All other systems reviewed and are negative.     Physical Exam ED Triage Vitals  Temp 10/08/23 1042 98.4 F (36.9 C)  Heart Rate 10/08/23 1025 90  Resp 10/08/23 1042 14  BP 10/08/23 1025 140/77  MAP (mmHg) --   SpO2 10/08/23 1042 93 %  O2 Device 10/08/23 1042 None (Room air)  O2 Flow Rate (L/min) --   Weight 10/08/23 1042 76.2 kg (168 lb)   Physical Exam Vitals and nursing note reviewed.  Constitutional:      General: She is not in acute distress.    Appearance: Normal appearance. She is not ill-appearing, toxic-appearing or diaphoretic.  HENT:     Head: Normocephalic.     Nose: Nose  normal.     Mouth/Throat:     Mouth: Mucous membranes are moist.     Pharynx: Oropharynx is clear.  Eyes:     Extraocular Movements: Extraocular movements intact.     Conjunctiva/sclera: Conjunctivae normal.     Pupils: Pupils are equal, round, and reactive to light.  Cardiovascular:     Rate and Rhythm: Normal rate and regular rhythm.     Pulses: Normal pulses.     Heart sounds: Normal heart sounds.  Pulmonary:     Effort: Pulmonary effort is normal. No respiratory distress.     Breath sounds: Normal breath sounds.  Chest:     Chest wall: No tenderness.  Abdominal:     General: Bowel sounds are normal.     Palpations: Abdomen is soft.     Tenderness: There is no abdominal tenderness.  Musculoskeletal:        General: No swelling or tenderness. Normal range of motion.     Cervical back: Normal range of motion and neck supple.  Skin:    General: Skin is warm and dry.     Capillary Refill: Capillary refill takes less than 2 seconds.  Neurological:     General: No focal deficit present.     Mental Status: She is alert and oriented to person, place, and time.  Psychiatric:        Mood and Affect: Mood normal.        Behavior: Behavior normal.  Thought Content: Thought content normal.        Judgment: Judgment normal.        CHA2DS2-VASc Score: N/A  Glasgow Coma Scale Score: 15                  Procedures                       ED Course & MDMmedical decision making moderate: Patient presents to the emergency room with a 1 week history of dizziness and nausea.  She states that she has difficulty standing and frequently nearly falls.  She has had this in the past.  Usually they give her IV fluids and this corrects this problem.  Patient has a history of dizziness, hypertension, elevated cholesterol, hypothyroidism, hyperparathyroidism, and right knee replacement. We will obtain an IV, lactated Ringer 's, CBC, CMP, Valium 2 mg IV, Zofran  4 mg IV,   Medical Decision  Making Amount and/or Complexity of Data Reviewed Labs: ordered.  Risk Prescription drug management.  Dlg social Patient was screened for food insecurity, housing instability, transportation needs, utility difficulty, interpersonal safety.  No needs were identified      ED Disposition:  Data Unavailable Final diagnoses:  None  #1 dizziness  ED Prescriptions   None           [1] Past Medical History: Diagnosis Date  . Anxiety   . Depression   . Edema   . Hyperlipidemia   . Hypertension   . Hypothyroid   [2] Past Surgical History: Procedure Laterality Date  . ADENOIDECTOMY     Procedure: ADENOIDECTOMY  . CARPAL TUNNEL RELEASE     Procedure: CARPAL TUNNEL RELEASE  . COLONOSCOPY     Procedure: COLONOSCOPY  . TONSILECTOMY, ADENOIDECTOMY, BILATERAL MYRINGOTOMY AND TUBES     Procedure: TONSILECTOMY, ADENOIDECTOMY, BILATERAL MYRINGOTOMY AND TUBES  . TONSILLECTOMY     Procedure: TONSILLECTOMY  . TYMPANOSTOMY TUBE PLACEMENT     Procedure: TYMPANOSTOMY TUBE PLACEMENT  [3] No family history on file. [4] Social History Tobacco Use  . Smoking status: Never  . Smokeless tobacco: Never  Substance Use Topics  . Alcohol use: Never

## 2023-10-16 DIAGNOSIS — N183 Chronic kidney disease, stage 3 unspecified: Secondary | ICD-10-CM | POA: Diagnosis not present

## 2023-10-22 DIAGNOSIS — R000 Tachycardia, unspecified: Secondary | ICD-10-CM | POA: Diagnosis not present

## 2023-10-22 DIAGNOSIS — E7800 Pure hypercholesterolemia, unspecified: Secondary | ICD-10-CM | POA: Diagnosis not present

## 2023-10-22 DIAGNOSIS — N183 Chronic kidney disease, stage 3 unspecified: Secondary | ICD-10-CM | POA: Diagnosis not present

## 2023-10-22 DIAGNOSIS — Z23 Encounter for immunization: Secondary | ICD-10-CM | POA: Diagnosis not present

## 2023-10-22 DIAGNOSIS — I129 Hypertensive chronic kidney disease with stage 1 through stage 4 chronic kidney disease, or unspecified chronic kidney disease: Secondary | ICD-10-CM | POA: Diagnosis not present

## 2023-10-22 DIAGNOSIS — E669 Obesity, unspecified: Secondary | ICD-10-CM | POA: Diagnosis not present

## 2023-10-22 DIAGNOSIS — R259 Unspecified abnormal involuntary movements: Secondary | ICD-10-CM | POA: Diagnosis not present

## 2023-11-16 ENCOUNTER — Emergency Department (HOSPITAL_BASED_OUTPATIENT_CLINIC_OR_DEPARTMENT_OTHER)

## 2023-11-16 ENCOUNTER — Emergency Department (HOSPITAL_BASED_OUTPATIENT_CLINIC_OR_DEPARTMENT_OTHER)
Admission: EM | Admit: 2023-11-16 | Discharge: 2023-11-16 | Disposition: A | Discharge status: Home / Self Care | Attending: Emergency Medicine | Admitting: Emergency Medicine

## 2023-11-16 ENCOUNTER — Other Ambulatory Visit: Payer: Self-pay

## 2023-11-16 ENCOUNTER — Encounter (HOSPITAL_BASED_OUTPATIENT_CLINIC_OR_DEPARTMENT_OTHER): Payer: Self-pay | Admitting: Emergency Medicine

## 2023-11-16 DIAGNOSIS — Z79899 Other long term (current) drug therapy: Secondary | ICD-10-CM | POA: Insufficient documentation

## 2023-11-16 DIAGNOSIS — K5900 Constipation, unspecified: Secondary | ICD-10-CM | POA: Diagnosis not present

## 2023-11-16 DIAGNOSIS — I10 Essential (primary) hypertension: Secondary | ICD-10-CM | POA: Diagnosis not present

## 2023-11-16 DIAGNOSIS — R110 Nausea: Secondary | ICD-10-CM

## 2023-11-16 DIAGNOSIS — R109 Unspecified abdominal pain: Secondary | ICD-10-CM | POA: Diagnosis not present

## 2023-11-16 DIAGNOSIS — N390 Urinary tract infection, site not specified: Secondary | ICD-10-CM | POA: Insufficient documentation

## 2023-11-16 DIAGNOSIS — N2889 Other specified disorders of kidney and ureter: Secondary | ICD-10-CM | POA: Diagnosis not present

## 2023-11-16 DIAGNOSIS — E039 Hypothyroidism, unspecified: Secondary | ICD-10-CM | POA: Insufficient documentation

## 2023-11-16 LAB — COMPREHENSIVE METABOLIC PANEL WITH GFR
ALT: 15 U/L (ref 0–44)
AST: 19 U/L (ref 15–41)
Albumin: 4.2 g/dL (ref 3.5–5.0)
Alkaline Phosphatase: 129 U/L — ABNORMAL HIGH (ref 38–126)
Anion gap: 12 (ref 5–15)
BUN: 16 mg/dL (ref 8–23)
CO2: 27 mmol/L (ref 22–32)
Calcium: 9.8 mg/dL (ref 8.9–10.3)
Chloride: 100 mmol/L (ref 98–111)
Creatinine, Ser: 1.52 mg/dL — ABNORMAL HIGH (ref 0.44–1.00)
GFR, Estimated: 36 mL/min — ABNORMAL LOW (ref >60)
Glucose, Bld: 92 mg/dL (ref 70–99)
Potassium: 3.3 mmol/L — ABNORMAL LOW (ref 3.5–5.1)
Sodium: 139 mmol/L (ref 135–145)
Total Bilirubin: 0.5 mg/dL (ref 0.0–1.2)
Total Protein: 7.6 g/dL (ref 6.5–8.1)

## 2023-11-16 LAB — CBC WITH DIFFERENTIAL/PLATELET
Abs Immature Granulocytes: 0.04 K/uL (ref 0.00–0.07)
Basophils Absolute: 0.1 K/uL (ref 0.0–0.1)
Basophils Relative: 1 %
Eosinophils Absolute: 0.1 K/uL (ref 0.0–0.5)
Eosinophils Relative: 1 %
HCT: 40.9 % (ref 36.0–46.0)
Hemoglobin: 13.4 g/dL (ref 12.0–15.0)
Immature Granulocytes: 0 %
Lymphocytes Relative: 16 %
Lymphs Abs: 1.9 K/uL (ref 0.7–4.0)
MCH: 28.5 pg (ref 26.0–34.0)
MCHC: 32.8 g/dL (ref 30.0–36.0)
MCV: 86.8 fL (ref 80.0–100.0)
Monocytes Absolute: 0.9 K/uL (ref 0.1–1.0)
Monocytes Relative: 8 %
Neutro Abs: 9.1 K/uL — ABNORMAL HIGH (ref 1.7–7.7)
Neutrophils Relative %: 74 %
Platelets: 317 K/uL (ref 150–400)
RBC: 4.71 MIL/uL (ref 3.87–5.11)
RDW: 13.8 % (ref 11.5–15.5)
WBC: 12.1 K/uL — ABNORMAL HIGH (ref 4.0–10.5)
nRBC: 0 % (ref 0.0–0.2)

## 2023-11-16 LAB — URINALYSIS, ROUTINE W REFLEX MICROSCOPIC
Bilirubin Urine: NEGATIVE
Glucose, UA: NEGATIVE mg/dL
Hgb urine dipstick: NEGATIVE
Ketones, ur: NEGATIVE mg/dL
Nitrite: NEGATIVE
Protein, ur: 30 mg/dL — AB
Specific Gravity, Urine: 1.02 (ref 1.005–1.030)
pH: 5.5 (ref 5.0–8.0)

## 2023-11-16 LAB — LIPASE, BLOOD: Lipase: 15 U/L (ref 11–51)

## 2023-11-16 MED ORDER — SENNOSIDES-DOCUSATE SODIUM 8.6-50 MG PO TABS: 1.0000 | ORAL_TABLET | Freq: Two times a day (BID) | ORAL | 0 refills | Status: AC

## 2023-11-16 MED ORDER — POLYETHYLENE GLYCOL 3350 17 G PO PACK: 17.0000 g | PACK | Freq: Every day | ORAL | 0 refills | Status: DC

## 2023-11-16 MED ORDER — CEPHALEXIN 500 MG PO CAPS: 500.0000 mg | ORAL_CAPSULE | Freq: Two times a day (BID) | ORAL | 0 refills | Status: AC

## 2023-11-16 MED ORDER — LACTATED RINGERS IV BOLUS
1000.0000 mL | Freq: Once | INTRAVENOUS | Status: AC
  Administered 2023-11-16: 1000 mL via INTRAVENOUS

## 2023-11-16 MED ORDER — POTASSIUM CHLORIDE CRYS ER 20 MEQ PO TBCR
40.0000 meq | EXTENDED_RELEASE_TABLET | Freq: Once | ORAL | Status: AC
  Administered 2023-11-16: 40 meq via ORAL
  Filled 2023-11-16: qty 2

## 2023-11-16 NOTE — ED Notes (Signed)
 Patient transported to CT

## 2023-11-16 NOTE — ED Provider Notes (Signed)
 [facility] AT [facility] Provider Note   CSN: [id number] Arrival date & time: 11/16/23  1211     Patient presents with: Abdominal Pain   Sierra Banks is a 73 y.o. female.   HPI      3/10, dull pain above the pubic bone Started yesterday, thought maybe had a kidney stone because has history of those Also thinks may be constipated Been nauseas, dizzy/lightheaded 3rd time in the past 3 months felt needed fluids No pain into back Pain waxing and waning Just feels heavy No vomiting Last bowel movement was 4 days ago Low grade temp last night , 99.9 Doesn't feel like eating and drinking well No vaginal bleeding or dishcarge No dysuria but foul smell   Past Medical History:  Diagnosis Date   ABSCESS, BREAST, RIGHT 10/04/2009   Allergic rhinitis    DEPRESSION 08/28/2006   Dysrhythmia    HEART PALPITATIONS    GERD (gastroesophageal reflux disease)    OCC TUMS    Hepatitis    JAUNDICE AGE 67   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 Authorizing Provider  cephALEXin (KEFLEX) 500 MG capsule Take 1 capsule (500 mg total) by mouth 2 (two) times daily for 7 days. 11/16/23 11/23/23 Yes Dreama Longs, MD  polyethylene glycol (MIRALAX) 17 g packet Take 17 g by mouth daily. 11/16/23  Yes Dreama Longs, MD  senna-docusate (SENOKOT-S) 8.6-50 MG tablet Take 1 tablet by mouth 2 (two) times daily. 11/16/23  Yes Dreama Longs, MD  albuterol  (VENTOLIN  HFA) 108 (90 Base) MCG/ACT inhaler Inhale 2 puffs into the lungs every 6 (six) hours as needed for wheezing or shortness of breath. Patient not taking: Reported on 09/11/2023 12/29/19   Briana Avena D, MD  amLODipine  (NORVASC ) 10 MG tablet Take 10 mg by mouth at  bedtime.    [provider]  amLODipine  (NORVASC ) 5 MG tablet Take 2 tablets (10 mg total) by mouth daily. Patient not taking: Reported on 09/11/2023 12/29/19   Nettey, Shayla D, MD  atorvastatin (LIPITOR) 20 MG tablet Take 20 mg by mouth daily.    [provider]  buPROPion  (WELLBUTRIN ) 75 MG tablet TAKE 1 TABLET AT BEDTIME 09/20/12   Krystal Reyes LABOR, MD  cloNIDine  (CATAPRES ) 0.1 MG tablet Take 0.1 mg by mouth at bedtime.    [provider]  dicyclomine  (BENTYL ) 20 MG tablet Take 1 tablet (20 mg total) by mouth 3 (three) times daily before meals. Patient not taking: Reported on 09/11/2023 01/18/22   Aneita Gwendlyn DASEN, MD  furosemide  (LASIX ) 40 MG tablet TAKE 1 TABLET BY MOUTH EVERY MORNING 09/20/12   Krystal Reyes LABOR, MD  levothyroxine  (SYNTHROID ) 100 MCG tablet Take 100 mcg by mouth daily before breakfast.    [provider]  ondansetron  (ZOFRAN -ODT) 4 MG disintegrating tablet Take 1 tablet (4 mg total) by mouth every 8 (eight) hours as needed for nausea or vomiting. Patient not taking: Reported on 09/11/2023 01/11/23   Yolande Lamar BROCKS, MD  oxybutynin (DITROPAN) 5 MG tablet Take 5 mg by mouth daily.    [provider]  oxyCODONE  (ROXICODONE ) 5 MG immediate release tablet Take 1 tablet (5 mg total) by mouth every 4 (four) hours as needed. Patient not  taking: Reported on 09/11/2023 01/11/23   Yolande Lamar BROCKS, MD  predniSONE  (DELTASONE ) 10 MG tablet Take 40 mg daily x 2 days then 20 mg daily x 2 days then 10 mg daily x 2 days Patient not taking: Reported on 03/08/2020 12/29/19   Briana Joya BIRCH, MD  Semaglutide -Weight Management (WEGOVY ) 0.25 MG/0.5ML SOAJ Inject 0.25 mg into the skin once a week. Patient not taking: Reported on 09/11/2023 09/04/22     Semaglutide -Weight Management (WEGOVY ) 0.25 MG/0.5ML SOAJ Inject 0.25 mg into the skin once a week. Patient not taking: Reported on 09/11/2023 09/28/22     Semaglutide -Weight Management (WEGOVY ) 0.5 MG/0.5ML SOAJ  Inject 0.5 mg into the skin once a week. Patient not taking: Reported on 09/11/2023 06/02/22     Semaglutide -Weight Management (WEGOVY ) 1 MG/0.5ML SOAJ Inject 1 mg into the skin once a week. Patient not taking: Reported on 09/11/2023 06/02/22     Semaglutide -Weight Management (WEGOVY ) 1 MG/0.5ML SOAJ Inject 1 mg into the skin once a week. Patient not taking: Reported on 09/11/2023 06/29/22     SUPREP BOWEL PREP  KIT 17.5-3.13-1.6 GM/177ML SOLN Take by mouth. Patient not taking: Reported on 01/18/2022 11/20/19   [provider]  tirzepatide (ZEPBOUND) 2.5 MG/0.5ML Pen Inject 2.5 mg into the skin once a week.    [provider]  traMADol  (ULTRAM ) 50 MG tablet Take 1-2 tablets (50-100 mg total) by mouth every 6 (six) hours as needed for moderate pain (pain score 4-6). 09/14/23   Eletha Boas, MD  valsartan (DIOVAN) 160 MG tablet Take 160 mg by mouth daily.    [provider]  venlafaxine  XR (EFFEXOR -XR) 150 MG 24 hr capsule TAKE ONE CAPSULE BY MOUTH TWICE A DAY Patient taking differently: Take 300 mg by mouth at bedtime. 06/18/12   Boas Reyes LABOR, MD    Allergies: Patient has no known allergies.    Review of Systems  Updated Vital Signs BP (!) 142/78   Pulse 81   Temp 98 F (36.7 C) (Oral)   Resp 18   SpO2 97%   Physical Exam Vitals and nursing note reviewed.  Constitutional:      General: She is not in acute distress.    Appearance: She is well-developed. She is not diaphoretic.  HENT:     Head: Normocephalic and atraumatic.  Eyes:     Conjunctiva/sclera: Conjunctivae normal.  Cardiovascular:     Rate and Rhythm: Normal rate and regular rhythm.  Pulmonary:     Effort: Pulmonary effort is normal. No respiratory distress.  Abdominal:     General: There is no distension.     Palpations: Abdomen is soft.     Tenderness: There is abdominal tenderness. There is no guarding.  Musculoskeletal:        General: No tenderness.     Cervical back: Normal range of motion.   Skin:    General: Skin is warm and dry.     Findings: No erythema or rash.  Neurological:     Mental Status: She is alert and oriented to person, place, and time.     (all labs ordered are listed, but only abnormal results are displayed) Labs Reviewed  URINALYSIS, ROUTINE W REFLEX MICROSCOPIC - Abnormal; Notable for the following components:      Result Value   Protein, ur 30 (*)    Leukocytes,Ua TRACE (*)    Bacteria, UA RARE (*)    All other components within normal limits  CBC WITH DIFFERENTIAL/PLATELET - Abnormal; Notable for the  following components:   WBC 12.1 (*)    Neutro Abs 9.1 (*)    All other components within normal limits  COMPREHENSIVE METABOLIC PANEL WITH GFR - Abnormal; Notable for the following components:   Potassium 3.3 (*)    Creatinine, Ser 1.52 (*)    Alkaline Phosphatase 129 (*)    GFR, Estimated 36 (*)    All other components within normal limits  URINE CULTURE  LIPASE, BLOOD    EKG: None  Radiology: CT Renal Stone Study Result Date: 11/16/2023 EXAM: CT UROGRAM 11/16/2023 01:40:34 PM TECHNIQUE: CT of the abdomen and pelvis was performed without intravenous contrast as per CT urogram protocol. Multiplanar reformatted images as well as MIP urogram images are provided for review. Automated exposure control, iterative reconstruction, and/or weight based adjustment of the mA/kV was utilized to reduce the radiation dose to as low as reasonably achievable. COMPARISON: 01/11/2023 and ultrasound of 06/22/2023. CLINICAL HISTORY: Abdominal/flank pain, stone suspected. Lower back pain, similar to kidney stones. Constipation. Prior right renal angiomyolipoma embolization. FINDINGS: LOWER CHEST: Descending thoracic aortic atherosclerosis. LIVER: The liver is unremarkable. GALLBLADDER AND BILE DUCTS: Cholecystectomy. No biliary ductal dilatation. SPLEEN: No acute abnormality. PANCREAS: No acute abnormality. ADRENAL GLANDS: No acute abnormality. KIDNEYS, URETERS AND  BLADDER: Embolization coils along the posteromedial margin of a 3.6 x 2.5 cm fatty mass along the right kidney lower pole favoring embolized angiomyolipoma previously measuring 3.9 x 2.8 cm on 01/11/2023. Stable subcentimeter hypodense lesion anteriorly in the left mid kidney on image 24 series 2 likely a small benign cyst although technically too small to characterize. No further imaging workup of this lesion is indicated. Per consensus, no follow-up is needed for simple Bosniak type 1 and 2 renal cysts, unless the patient has a malignancy history or risk factors. No stones in the kidneys or ureters. No hydronephrosis. No perinephric or periureteral stranding. Urinary bladder is unremarkable. GI AND BOWEL: Stomach demonstrates no acute abnormality. Thoracic prominence of stool throughout the colon, favoring constipation. There is no bowel obstruction. PERITONEUM AND RETROPERITONEUM: No ascites. No free air. VASCULATURE: Descending thoracic aortic atherosclerosis. Systemic atherosclerosis is present, including the aorta and iliac arteries. LYMPH NODES: No lymphadenopathy. REPRODUCTIVE ORGANS: No acute abnormality. BONES AND SOFT TISSUES: Lumbar spondylosis and degenerative disc disease noted. No acute osseous abnormality. No focal soft tissue abnormality. IMPRESSION: 1. No nephrolithiasis, obstructive uropathy, or urothelial mass. 2. 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 3. Constipation. 4. Systemic atherosclerosis. 5. Lumbar spondylosis and degenerative disc disease. Electronically signed by: Ryan Salvage MD 11/16/2023 02:07 PM EDT RP Workstation: [id number]     Procedures   Medications Ordered in the ED  lactated ringers  bolus 1,000 mL (0 mLs Intravenous Stopped 11/16/23 1545)  potassium chloride  SA (KLOR-CON  M) CR tablet 40 mEq (40 mEq Oral Given 11/16/23 1434)                                     73yo female with history of htn, hlpd, nephrolithiasis, presents with  concern for lower abdominal pain.  DDx includes appendicitis, pancreatitis, cholecystitis, pyelonephritis, nephrolithiasis, diverticulitis, SBO, AAA, hernia. Lower clinical suspicion for AAA, dissection by hx and eam.  Labs completed and evaluated by me show Cr mildly increased from prior, mild hypokalemia. Given IV fluids and K. Mild leukocytosis on labs.  CT stone study shows no evidence of stones.or other acute abnormalities-does show constipation.    UA also  shows possible UTI, and given symptoms will treat for UTI. Given keflex and discussed constipation regimen as well. Recommend repeat Cr as an outpatient. Patient discharged in stable condition with understanding of reasons to return.       Final diagnoses:  Nausea  Urinary tract infection without hematuria, site unspecified  Constipation, unspecified constipation type    ED Discharge Orders          Ordered    cephALEXin (KEFLEX) 500 MG capsule  2 times daily        11/16/23 1538    senna-docusate (SENOKOT-S) 8.6-50 MG tablet  2 times daily        11/16/23 1538    polyethylene glycol (MIRALAX) 17 g packet  Daily        11/16/23 1538               Dreama Longs, MD 11/17/23 [phone]

## 2023-11-16 NOTE — ED Notes (Signed)
 Discharge instructions, follow up care, and prescriptions reviewed and explained, pt verbalized understanding and had no further questions on d/c. Pt caox4, ambulatory NAD on d/c.

## 2023-11-16 NOTE — ED Triage Notes (Signed)
 C/o lower back pain and rads into back. States feels similar as kidney stones. Endorses some constipation. Denies urinary symptoms

## 2023-11-18 DIAGNOSIS — R110 Nausea: Secondary | ICD-10-CM | POA: Diagnosis not present

## 2023-11-18 LAB — URINE CULTURE: Culture: >=100000 — AB

## 2023-11-19 ENCOUNTER — Telehealth (HOSPITAL_BASED_OUTPATIENT_CLINIC_OR_DEPARTMENT_OTHER): Payer: Self-pay

## 2023-11-19 NOTE — Telephone Encounter (Signed)
 Post ED Visit - Positive Culture Follow-up: Successful Patient Follow-Up  Culture assessed and recommendations reviewed by:  [x]  Maegan Lamb, Pharm.D. []  Venetia Gully, Pharm.D., BCPS AQ-ID []  Garrel Crews, Pharm.D., BCPS []  Almarie Lunger, Pharm.D., BCPS []  [place], [address].D., BCPS, AAHIVP []  Rosaline Bihari, Pharm.D., BCPS, AAHIVP []  Vernell Meier, PharmD, BCPS []  Latanya Hint, PharmD, BCPS []  Donald Medley, PharmD, BCPS []  Rocky Bold, PharmD  Positive Urine culture  []  Patient discharged without antimicrobial prescription and treatment is now indicated []  Organism is resistant to prescribed ED discharge antimicrobial []  Patient with positive blood cultures  Changes discussed with ED provider: Rolan Quale, MD  Plan: call patient for s/s check. Stop cephalexin, if urinary symptoms present start Amoxicillin.  New antibiotic prescription Amoxicillin 500 mg po BID x 5 days  Instructed pt to stop taking Cephalexin, pt denies any urinary s/s. No new abx called in.    Contacted patient, date 11/19/23, time 11:35 am   Sierra Banks 11/19/2023, 11:40 AM

## 2023-11-19 NOTE — Progress Notes (Signed)
 ED Antimicrobial Stewardship Positive Culture Follow Up   Sierra Banks is an 73 y.o. female who presented to [facility] on 11/16/2023 with a chief complaint of  Chief Complaint  Patient presents with   Abdominal Pain    Recent Results (from the past 720 hours)  Urine Culture     Status: Abnormal   Collection Time: 11/16/23  1:25 PM   Specimen: Urine, Clean Catch  Result Value Ref Range Status   Specimen Description   Final    URINE, CLEAN CATCH Performed at [facility], [address], [place], [place] [address]    Special Requests   Final    NONE Performed at [facility], [address], [place], [place] [address]    Culture >=100,000 COLONIES/mL ENTEROCOCCUS FAECALIS (A)  Final   Report Status 11/18/2023 FINAL  Final   Organism ID, Bacteria ENTEROCOCCUS FAECALIS (A)  Final      Susceptibility   Enterococcus faecalis - MIC*    AMPICILLIN <=2 SENSITIVE Sensitive     NITROFURANTOIN <=16 SENSITIVE Sensitive     VANCOMYCIN  1 SENSITIVE Sensitive     * >=100,000 COLONIES/mL ENTEROCOCCUS FAECALIS    [x]  Treated with cephalexin, organism resistant to prescribed antimicrobial  Plan: Stop cephalexin. If urinary symptoms are present, start amoxicillin 500 mg po BID x 5 days.  ED Provider: Rolan Quale, MD   B. Maegan Kayler Buckholtz, PharmD PGY-1 Pharmacy Resident [facility] 11/19/2023 9:35 AM   Monday - Friday phone -  [phone] Saturday - Sunday phone - [phone]

## 2023-11-22 ENCOUNTER — Encounter: Payer: Self-pay | Admitting: Neurology

## 2023-11-26 DIAGNOSIS — N3090 Cystitis, unspecified without hematuria: Secondary | ICD-10-CM | POA: Diagnosis not present

## 2023-11-26 DIAGNOSIS — K5900 Constipation, unspecified: Secondary | ICD-10-CM | POA: Diagnosis not present

## 2023-11-26 DIAGNOSIS — Z23 Encounter for immunization: Secondary | ICD-10-CM | POA: Diagnosis not present

## 2023-12-18 ENCOUNTER — Encounter: Payer: Self-pay | Admitting: Internal Medicine

## 2023-12-18 ENCOUNTER — Ambulatory Visit: Attending: Internal Medicine | Admitting: Internal Medicine

## 2023-12-18 VITALS — BP 112/78 | HR 93 | Ht 64.0 in | Wt 173.2 lb

## 2023-12-18 DIAGNOSIS — Z7689 Persons encountering health services in other specified circumstances: Secondary | ICD-10-CM | POA: Diagnosis not present

## 2023-12-18 DIAGNOSIS — Z9189 Other specified personal risk factors, not elsewhere classified: Secondary | ICD-10-CM | POA: Diagnosis not present

## 2023-12-18 DIAGNOSIS — R000 Tachycardia, unspecified: Secondary | ICD-10-CM | POA: Diagnosis not present

## 2023-12-18 NOTE — Patient Instructions (Signed)
 Medication Instructions:  Your physician recommends that you continue on your current medications as directed. Please refer to the Current Medication list given to you today.  *If you need a refill on your cardiac medications before your next appointment, please call your pharmacy*  Testing/Procedures: WatchPAT?  Is a FDA cleared portable home sleep study test that uses a watch and 3 points of contact to monitor 7 different channels, including your heart rate, oxygen saturations, body position, snoring, and chest motion.  The study is easy to use from the comfort of your own home and accurately detect sleep apnea.  Before bed, you attach the chest sensor, attached the sleep apnea bracelet to your nondominant hand, and attach the finger probe.  After the study, the raw data is downloaded from the watch and scored for apnea events.   For more information: [email or web address]  Patient Testing Instructions:  Do not put battery into the device until bedtime when you are ready to begin the test. Please call the support number if you need assistance after following the instructions below: 24 hour support line- [phone] or [facility] support at [phone] (option 2)  Download the [facility] WatchPAT One app through the google play store or App Store  Be sure to turn on or enable access to bluetooth in settlings on your smartphone/ device  Make sure no other bluetooth devices are on and within the vicinity of your smartphone/ device and WatchPAT watch during testing.  Make sure to leave your smart phone/ device plugged in and charging all night.  When ready for bed:  Follow the instructions step by step in the WatchPAT One App to activate the testing device. For additional instructions, including video instruction, visit the WatchPAT One video on Youtube. You can search for WatchPat One within Youtube (video is 4 minutes and 18 seconds) or enter:  https://[email or web address]?[email or web address] Please note: You will be prompted to enter a Pin to connect via bluetooth when starting the test. The PIN will be assigned to you when you receive the test.  The device is disposable, but it recommended that you retain the device until you receive a call letting you know the study has been received and the results have been interpreted.  We will let you know if the study did not transmit to us  properly after the test is completed. You do not need to call us  to confirm the receipt of the test.  Please complete the test within 48 hours of receiving PIN.   Frequently Asked Questions:  What is Watch Bruna one?  A single use fully disposable home sleep apnea testing device and will not need to be returned after completion.  What are the requirements to use WatchPAT one?  The be able to have a successful watchpat one sleep study, you should have your Watch pat one device, your smart phone, watch pat one app, your PIN number and Internet access What type of phone do I need?  You should have a smart phone that uses Android 5.1 and above or any Iphone with IOS 10 and above How can I download the WatchPAT one app?  Based on your device type search for WatchPAT one app either in google play for android devices or APP store for Iphone's Where will I get my PIN for the study?  Your PIN will be provided by your physician's office. It is used for authentication and if you lose/forget your PIN, please reach out to your providers office.  I do not have Internet  at home. Can I do WatchPAT one study?  WatchPAT One needs Internet connection throughout the night to be able to transmit the sleep data. You can use your home/local internet or your cellular's data package. However, it is always recommended to use home/local Internet. It is estimated that between 20MB-30MB will be used with each study.However, the application will be looking for space in the phone to start the study.   What happens if I lose internet or bluetooth connection?  During the internet disconnection, your phone will not be able to transmit the sleep data. All the data, will be stored in your phone. As soon as the internet connection is back on, the phone will being sending the sleep data. During the bluetooth disconnection, WatchPAT one will not be able to to send the sleep data to your phone. Data will be kept in the WatchPAT one until two devices have bluetooth connection back on. As soon as the connection is back on, WatchPAT one will send the sleep data to the phone.  How long do I need to wear the WatchPAT one?  After you start the study, you should wear the device at least 6 hours.  How far should I keep my phone from the device?  During the night, your phone should be within 15 feet.  What happens if I leave the room for restroom or other reasons?  Leaving the room for any reason will not cause any problem. As soon as your get back to the room, both devices will reconnect and will continue to send the sleep data. Can I use my phone during the sleep study?  Yes, you can use your phone as usual during the study. But it is recommended to put your watchpat one on when you are ready to go to bed.  How will I get my study results?  A soon as you completed your study, your sleep data will be sent to the provider. They will then share the results with you when they are ready.     Follow-Up: At [facility], you and your health needs are our priority.  As part of our continuing mission to provide you with exceptional heart care, our providers are all part of one team.  This team includes your primary Cardiologist (physician) and Advanced Practice Providers or APPs (Physician Assistants and Nurse Practitioners) who all work together to provide you with the care you need, when you need it.  Your next appointment:   We will see you on an as needed basis.    Provider:   Dr. Mona

## 2023-12-18 NOTE — Progress Notes (Signed)
 OFFICE CONSULT NOTE  Chief Complaint:  Follow-up  Primary Care Physician: Teresa Channel, MD  HPI:  Sierra Banks is a 73 y.o. female who is being seen today for the evaluation of preoperative risk assessment at the request of Teresa Channel, MD. Sierra Banks was seen today at the request of Dr. Ebbie for preoperative risk assessment. 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 She underwent arterial embolization and postoperatively was complicated by sinus bradycardia. She had heart rates in the 30s and 40s. She was asymptomatic with this. She was noted to be on 100 mg of atenolol  that was decreased to 50 mg and ultimately discontinued. Subsequently the medication was restarted by her primary care provider and she's had no further recurrence of bradycardia that she's aware of. She was found to be mildly hypothyroid with a TSH of 11. She was started on low-dose levothyroxine  25 g daily and seems to be tolerating this. As part of her workup for the renal mass she was found to have numerous gallstones and has been symptomatic with this. Subsequently a cholecystectomy is recommended. She's here for cardiac clearance for that procedure. She denies any chest pain, shortness of breath with exertion and is able to do more than 4 metabolic equivalents of activity without any limitation.  12/18/2023  Sierra Banks returns today for follow-up.  I saw her a number years ago for preoperative clearance and she is considered a new patient today.  She recently has been referred back for tachycardia, specifically she has been noted to have some sinus tachycardia by her primary care provider.  She denies any palpitations per se but does note that her heart rate has been elevated sometimes consistently.  Of note she has poor sleep at night.  Her husband who recently was started on CPAP notes that she snores as well and has been noted to stop breathing.  She is fatigued often during the  day and has to take naps.  Blood pressure was normal.  He has been having some muscle jerking as well which could be related to poor sleep or apnea.  She is scheduled to see neurology in January for this.  She has also lost substantial weight recently.  She says over 50 pounds on GLP-1 agonist and dietary changes.  PMHx:  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/01/2009   History of kidney stones 04/05/2016   HYPERLIPIDEMIA 12/23/2007   HYPERTENSION 08/28/2006   Hypothyroidism    not as tired as she was   Overweight(278.02) 01/12/2009    Past Surgical History:  Procedure Laterality Date   CARPAL TUNNEL RELEASE     LEFT  2002   CHOLECYSTECTOMY N/A 12/12/2016   Procedure: LAPAROSCOPIC CHOLECYSTECTOMY;  Surgeon: Ebbie Cough, MD;  Location: [facility] OR;  Service: General;  Laterality: N/A;   DILATION AND CURETTAGE OF UTERUS     2011   fatty tumor removed from right arm  2016   IR EMBO TUMOR ORGAN ISCHEMIA INFARCT INC GUIDE ROADMAPPING  10/24/2016   IR RADIOLOGIST EVAL & MGMT  10/12/2016   IR RADIOLOGIST EVAL & MGMT  11/16/2016   IR RADIOLOGIST EVAL & MGMT  02/15/2017   IR  RADIOLOGIST EVAL & MGMT  08/15/2017   IR RADIOLOGIST EVAL & MGMT  10/08/2018   IR RADIOLOGIST EVAL & MGMT  09/02/2019   IR RENAL SUPRASEL UNI S&I MOD SED  10/24/2016   IR RENAL SUPRASEL UNI S&I MOD SED  10/24/2016   IR US  GUIDE VASC ACCESS RIGHT  10/24/2016   JOINT REPLACEMENT Left    MASS EXCISION Right 08/21/2014   Procedure: RIGHT FOREARM MASS EXCISION WITH REPAIR AND RECONSTRUCTION;  Surgeon: Elsie Mussel, MD;  Location: [facility];  Service: Orthopedics;  Laterality: Right;   PARATHYROIDECTOMY Right 09/14/2023   Procedure: PARATHYROIDECTOMY;  Surgeon: Eletha Boas, MD;  Location: [facility]  ORS;  Service: General;  Laterality: Right;   TONSILLECTOMY     1969   TOTAL KNEE ARTHROPLASTY  05/08/2011   Procedure: TOTAL KNEE ARTHROPLASTY;  Surgeon: Garnette JONETTA Raman, MD;  Location: [facility] OR;  Service: Orthopedics;  Laterality: Left;   TUBAL LIGATION     1980    FAMHx:  Family History  Problem Relation Age of Onset   Colon polyps Sister    Colon cancer Maternal Uncle    Colon cancer Maternal Grandfather    Heart disease Mother    Hypertension Mother    Esophageal cancer Neg Hx    Pancreatic cancer Neg Hx    Liver disease Neg Hx    Stomach cancer Neg Hx     SOCHx:   reports that she has never smoked. She has never used smokeless tobacco. She reports that she does not drink alcohol and does not use drugs.  ALLERGIES:  No Known Allergies  ROS: Pertinent items noted in HPI and remainder of comprehensive ROS otherwise negative.  HOME MEDS: Current Outpatient Medications on File Prior to Visit  Medication Sig Dispense Refill   albuterol  (VENTOLIN  HFA) 108 (90 Base) MCG/ACT inhaler Inhale 2 puffs into the lungs every 6 (six) hours as needed for wheezing or shortness of breath. 8 g 2   amLODipine  (NORVASC ) 10 MG tablet Take 10 mg by mouth at bedtime.     atorvastatin (LIPITOR) 20 MG tablet Take 20 mg by mouth daily.     buPROPion  (WELLBUTRIN ) 75 MG tablet TAKE 1 TABLET AT BEDTIME 90 tablet 1   diazepam (VALIUM) 2 MG tablet Take 2 mg by mouth as needed for muscle spasms.     furosemide  (LASIX ) 40 MG tablet TAKE 1 TABLET BY MOUTH EVERY MORNING 100 tablet 0   levothyroxine  (SYNTHROID ) 100 MCG tablet Take 100 mcg by mouth daily before breakfast.     ondansetron  (ZOFRAN -ODT) 4 MG disintegrating tablet Take 1 tablet (4 mg total) by mouth every 8 (eight) hours as needed for nausea or vomiting. 12 tablet 0   oxybutynin (DITROPAN) 5 MG tablet Take 5 mg by mouth daily.     polyethylene glycol (MIRALAX) 17 g packet Take 17 g by mouth daily. 14 each 0   senna-docusate (SENOKOT-S) 8.6-50 MG  tablet Take 1 tablet by mouth 2 (two) times daily. 60 tablet 0   tirzepatide (ZEPBOUND) 2.5 MG/0.5ML Pen Inject 2.5 mg into the skin once a week.     traMADol  (ULTRAM ) 50 MG tablet Take 1-2 tablets (50-100 mg total) by mouth every 6 (six) hours as needed for moderate pain (pain score 4-6). 12 tablet 0   valsartan (DIOVAN) 160 MG tablet Take 160 mg by mouth daily.     venlafaxine  XR (EFFEXOR -XR) 150 MG 24 hr capsule TAKE ONE CAPSULE BY MOUTH TWICE A DAY (Patient taking  differently: Take 300 mg by mouth at bedtime.) 180 capsule 1   amLODipine  (NORVASC ) 5 MG tablet Take 2 tablets (10 mg total) by mouth daily. (Patient not taking: Reported on 12/18/2023) 30 tablet 1   cloNIDine  (CATAPRES ) 0.1 MG tablet Take 0.1 mg by mouth at bedtime. (Patient not taking: Reported on 12/18/2023)     dicyclomine  (BENTYL ) 20 MG tablet Take 1 tablet (20 mg total) by mouth 3 (three) times daily before meals. (Patient not taking: Reported on 12/18/2023) 90 tablet 11   oxyCODONE  (ROXICODONE ) 5 MG immediate release tablet Take 1 tablet (5 mg total) by mouth every 4 (four) hours as needed. (Patient not taking: Reported on 12/18/2023) 12 tablet 0   predniSONE  (DELTASONE ) 10 MG tablet Take 40 mg daily x 2 days then 20 mg daily x 2 days then 10 mg daily x 2 days (Patient not taking: Reported on 12/18/2023) 14 tablet 0   Semaglutide -Weight Management (WEGOVY ) 0.25 MG/0.5ML SOAJ Inject 0.25 mg into the skin once a week. (Patient not taking: Reported on 12/18/2023) 2 mL 0   Semaglutide -Weight Management (WEGOVY ) 0.25 MG/0.5ML SOAJ Inject 0.25 mg into the skin once a week. (Patient not taking: Reported on 12/18/2023) 2 mL 0   Semaglutide -Weight Management (WEGOVY ) 0.5 MG/0.5ML SOAJ Inject 0.5 mg into the skin once a week. (Patient not taking: Reported on 12/18/2023) 2 mL 0   Semaglutide -Weight Management (WEGOVY ) 1 MG/0.5ML SOAJ Inject 1 mg into the skin once a week. (Patient not taking: Reported on 12/18/2023) 2 mL 0   Semaglutide -Weight  Management (WEGOVY ) 1 MG/0.5ML SOAJ Inject 1 mg into the skin once a week. (Patient not taking: Reported on 12/18/2023) 2 mL 0   SUPREP BOWEL PREP  KIT 17.5-3.13-1.6 GM/177ML SOLN Take by mouth. (Patient not taking: Reported on 12/18/2023)     No current facility-administered medications on file prior to visit.    LABS/IMAGING: No results found for this or any previous visit (from the past 48 hours). No results found.  LIPID PANEL:    Component Value Date/Time   CHOL 180 03/20/2011 0930   TRIG 156.0 (H) 03/20/2011 0930   HDL 50.60 03/20/2011 0930   CHOLHDL 4 03/20/2011 0930   VLDL 31.2 03/20/2011 0930   LDLCALC 98 03/20/2011 0930    WEIGHTS: Wt Readings from Last 3 Encounters:  12/18/23 173 lb 3.2 oz (78.6 kg)  09/14/23 169 lb 11.2 oz (77 kg)  09/13/23 169 lb 11.2 oz (77 kg)    VITALS: BP 112/78 (BP Location: Left Arm, Patient Position: Sitting, Cuff Size: Normal)   Pulse 93   Ht 5' 4 (1.626 m)   Wt 173 lb 3.2 oz (78.6 kg)   SpO2 94%   BMI 29.73 kg/m   EXAM: General appearance: alert, no distress and moderately obese Neck: no carotid bruit, no JVD and thyroid  not enlarged, symmetric, no tenderness/mass/nodules Lungs: clear to auscultation bilaterally Heart: regular rate and rhythm Abdomen: soft, non-tender; bowel sounds normal; no masses,  no organomegaly Extremities: extremities normal, atraumatic, no cyanosis or edema Pulses: 2+ and symmetric Skin: Skin color, texture, turgor normal. No rashes or lesions Neurologic: Grossly normal Psych: Pleasant  EKG: EKG Interpretation Date/Time:  Tuesday December 18 2023 15:15:53 EST Ventricular Rate:  93 PR Interval:  174 QRS Duration:  88 QT Interval:  360 QTC Calculation: 447 R Axis:   -26  Text Interpretation: Normal sinus rhythm Moderate voltage criteria for LVH, may be normal variant ( R in aVL , Cornell product ) When compared with ECG  of 11-Jan-2023 12:03, PREVIOUS ECG IS PRESENT Confirmed by Banks Sierra  [phone]) on 12/18/2023 3:20:06 PM   ASSESSMENT: Sinus tachycardia At risk for OSA-STOP-BANG score of 5 Hypertension Dyslipidemia Moderate obesity Hypothyroidism  PLAN: 1.   Sierra Banks was referred for evaluation of sinus tachycardia.  Her husband notes that there is heavy snoring and difficulty sleeping at night as well as feeling fatigued during the day needing to take naps.  She has a history of hypertension and some witnessed apnea.  Her STOP-BANG score is 5 and this could be contributing to her tachycardia.  I advised a home sleep study which we will arrange.  Blood pressure is well-controlled.  Her thyroid  is also well-regulated.  She denies any angina or worsening shortness of breath with exertion suggesting against a cardiac etiology for her tachycardia.  Thanks again for the kind referral.  Follow-up with me as needed.  Sierra KYM Mona, MD, [facility], FNLA, FACP  [place]  [facility]  Medical Director of the [facility] Lipid [facility] &  Cardiovascular Risk Reduction Clinic Diplomate of the American Board of Clinical Lipidology Attending Cardiologist  Direct Dial: [phone]  Fax: [phone]  Website:  [email or web address]   Sierra Banks 12/18/2023, 3:20 PM

## 2024-01-01 DIAGNOSIS — Z1231 Encounter for screening mammogram for malignant neoplasm of breast: Secondary | ICD-10-CM | POA: Diagnosis not present

## 2024-01-14 DIAGNOSIS — Z03818 Encounter for observation for suspected exposure to other biological agents ruled out: Secondary | ICD-10-CM | POA: Diagnosis not present

## 2024-01-14 DIAGNOSIS — H6692 Otitis media, unspecified, left ear: Secondary | ICD-10-CM | POA: Diagnosis not present

## 2024-01-14 DIAGNOSIS — R0981 Nasal congestion: Secondary | ICD-10-CM | POA: Diagnosis not present

## 2024-01-14 DIAGNOSIS — R059 Cough, unspecified: Secondary | ICD-10-CM | POA: Diagnosis not present

## 2024-02-16 ENCOUNTER — Encounter (HOSPITAL_BASED_OUTPATIENT_CLINIC_OR_DEPARTMENT_OTHER): Payer: Self-pay | Admitting: Cardiology

## 2024-02-16 DIAGNOSIS — R0683 Snoring: Secondary | ICD-10-CM | POA: Diagnosis not present

## 2024-02-17 NOTE — Procedures (Signed)
" ° °  SLEEP STUDY REPORT Patient Information Study Date: 02/17/2024 Patient Name: Sierra Banks Patient ID: [id number] Birth Date: [birth year] Age: 74 Gender: Female BMI: 29.7 (W=174 lb, H=5' 4'')\ Referring Physician: Vinie Maxcy, MD  TEST DESCRIPTION: Home sleep apnea testing was completed using the WatchPat, a Type 1 device, utilizing peripheral arterial tonometry (PAT), chest movement, actigraphy, pulse oximetry, pulse rate, body position and snore. AHI was calculated with apnea and hypopnea using valid sleep time as the denominator. RDI includes apneas, hypopneas, and RERAs. The data acquired and the scoring of sleep and all associated events were performed in accordance with the recommended standards and specifications as outlined in the AASM Manual for the Scoring of Sleep and Associated Events 2.2.0 (2015).  FINDINGS: 1. No evidence of Obstructive Sleep Apnea with AHI 3.4/hr. 2. No Central Sleep Apnea. 3. Oxygen desaturations as low as 88%. 4. Severe snoring was present. O2 sats were < 88% for 0 minutes. 5. Total sleep time was 6 hrs and 23 min. 6. 19.7% of total sleep time was spent in REM sleep. 7. Normal sleep onset latency at 30 min. 8. Prolonged REM sleep onset latency at 256 min. 9. Total awakenings were 7.  DIAGNOSIS: Normal study with no significant sleep disordered breathing.  RECOMMENDATIONS: 1. Normal study with no significant sleep disordered breathing. 2. Healthy sleep recommendations include: adequate nightly sleep (normal 7-9 hrs/night), avoidance of caffeine after noon and alcohol near bedtime, and maintaining a sleep environment that is cool, dark and quiet. 3. Weight loss for overweight patients is recommended. 4. Snoring recommendations include: weight loss where appropriate, side sleeping, and avoidance of alcohol before bed. 5. Operation of motor vehicle or dangerous equipment must be avoided when feeling drowsy, excessively sleepy, or mentally  fatigued. 6. An ENT consultation which may be useful for specific causes of and possible treatment of bothersome snoring . 7. Weight loss may be of benefit in reducing the severity of snoring.   Signature: Wilbert Bihari, MD; [facility]; Diplomat, American Board of Sleep Medicine Electronically Signed: 02/17/2024 9:24:22 PM "

## 2024-02-26 ENCOUNTER — Ambulatory Visit (INDEPENDENT_AMBULATORY_CARE_PROVIDER_SITE_OTHER): Admitting: Neurology

## 2024-02-26 ENCOUNTER — Other Ambulatory Visit

## 2024-02-26 ENCOUNTER — Encounter: Payer: Self-pay | Admitting: Neurology

## 2024-02-26 VITALS — BP 115/75 | HR 95 | Ht 64.0 in | Wt 182.0 lb

## 2024-02-26 DIAGNOSIS — G253 Myoclonus: Secondary | ICD-10-CM

## 2024-02-26 DIAGNOSIS — R001 Bradycardia, unspecified: Secondary | ICD-10-CM

## 2024-02-26 NOTE — Progress Notes (Signed)
 [facility] Clinic Note - Initial Visit   Date: 02/26/2024   Sierra Banks MRN: [id number] DOB: 1950-10-19   Dear Dr. Teresa:  Thank you for your kind referral of Sierra Banks for consultation of myoclonic jerks. Although her history is well known to you, please allow us  to reiterate it for the purpose of our medical record. The patient was accompanied to the clinic by husband who also provides collateral information.     Sierra Banks is a 74 y.o. right-handed female with hyperparathyroidism s/p parathyroidectomy (08/2023), hyperlipidemia, hypertension, and depression presenting for evaluation of myoclonic jerks.   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 She does not lose awareness or have any associated weakness/sensory disturbance.  Explains that systemic conditions are most common cause of these twitches, such metabolic derangements, vitamin deficiency, or medications. She was on Zepbound until a month ago, which can cause myolconic jerks.   I recommend staying off this for now. Check labs as noted below: - CMP, vitamin B12, TSH, magnesium  - If labs are normal and symptoms persist, can try keppra  250mg  daily to control movements.    Further recommendations pending results.   ------------------------------------------------------------- History of present illness:Discussed the use of AI scribe software for clinical note transcription with the patient, who gave verbal consent to proceed.  History of Present Illness Sierra Banks is a 74 year old female who presents with muscle twitches and jerks.  She has been experiencing muscle twitches and jerks since July, initially frequent and severe, easing off in November, but returning in December with less severity. The twitches occur in her arms and legs, not her face, are brief, lasting only a second, and occur multiple times a day, approximately  every hour or two, without associated pain.  The twitches began after her parathyroid  surgery in July. Her elevated calcium levels led to the discovery of her parathyroid  issue, which has since normalized.   No falls, numbness, tingling, loss of consciousness, back pain, neck pain, or headaches associated with the twitches. She is fully aware during the episodes, which are quick and come on suddenly.  Her medication history includes long-term use of Wellbutrin , discontinuation of Zepbound two to three months ago, and previous use of furosemide , which was stopped.   She has a long-standing history of kidney issues, which have been stable over time.   Past Medical History:  Diagnosis Date   ABSCESS, BREAST, RIGHT 10/04/2009   Allergic rhinitis    DEPRESSION 08/28/2006   Dysrhythmia    HEART PALPITATIONS    GERD (gastroesophageal reflux disease)    OCC TUMS    Hepatitis    JAUNDICE AGE 9   Herpes Simplex Virus 07/01/2009   History of kidney stones 04/05/2016   HYPERLIPIDEMIA 12/23/2007   HYPERTENSION 08/28/2006   Hypothyroidism    not as tired as she was   Overweight(278.02) 01/12/2009    Past Surgical History:  Procedure Laterality Date   CARPAL TUNNEL RELEASE     LEFT  2002   CHOLECYSTECTOMY N/A 12/12/2016   Procedure: LAPAROSCOPIC CHOLECYSTECTOMY;  Surgeon: Ebbie Cough, MD;  Location: [facility];  Service: General;  Laterality: N/A;   DILATION AND CURETTAGE OF UTERUS     2011   fatty tumor removed from right arm  2016   IR EMBO TUMOR ORGAN ISCHEMIA INFARCT INC GUIDE ROADMAPPING  10/24/2016   IR RADIOLOGIST EVAL & MGMT  10/12/2016   IR RADIOLOGIST EVAL & MGMT  11/16/2016   IR RADIOLOGIST EVAL & MGMT  02/15/2017   IR RADIOLOGIST EVAL & MGMT  08/15/2017   IR RADIOLOGIST EVAL & MGMT  10/08/2018   IR RADIOLOGIST EVAL & MGMT  09/02/2019   IR RENAL SUPRASEL UNI S&I MOD SED  10/24/2016   IR RENAL SUPRASEL UNI S&I MOD SED  10/24/2016   IR US  GUIDE VASC ACCESS RIGHT  10/24/2016   JOINT  REPLACEMENT Left    MASS EXCISION Right 08/21/2014   Procedure: RIGHT FOREARM MASS EXCISION WITH REPAIR AND RECONSTRUCTION;  Surgeon: Elsie Mussel, MD;  Location: [facility];  Service: Orthopedics;  Laterality: Right;   PARATHYROIDECTOMY Right 09/14/2023   Procedure: PARATHYROIDECTOMY;  Surgeon: Eletha Boas, MD;  Location: [facility];  Service: General;  Laterality: Right;   TONSILLECTOMY     1969   TOTAL KNEE ARTHROPLASTY  05/08/2011   Procedure: TOTAL KNEE ARTHROPLASTY;  Surgeon: Garnette JONETTA Raman, MD;  Location: [facility];  Service: Orthopedics;  Laterality: Left;   TUBAL LIGATION     1980     Medications:  Outpatient Encounter Medications as of 02/26/2024  Medication Sig   albuterol  (VENTOLIN  HFA) 108 (90 Base) MCG/ACT inhaler Inhale 2 puffs into the lungs every 6 (six) hours as needed for wheezing or shortness of breath.   amLODipine  (NORVASC ) 10 MG tablet Take 5 mg by mouth daily. (Patient taking differently: Take 10 mg by mouth daily.)   atorvastatin (LIPITOR) 20 MG tablet Take 20 mg by mouth daily.   buPROPion  (WELLBUTRIN ) 75 MG tablet TAKE 1 TABLET AT BEDTIME   diazepam  (VALIUM ) 2 MG tablet Take 2 mg by mouth as needed for muscle spasms.   levothyroxine  (SYNTHROID ) 100 MCG tablet Take 100 mcg by mouth daily before breakfast.   ondansetron  (ZOFRAN -ODT) 4 MG disintegrating tablet Take 1 tablet (4 mg total) by mouth every 8 (eight) hours as needed for nausea or vomiting.   oxybutynin (DITROPAN) 5 MG tablet Take 5 mg by mouth daily.   senna-docusate (SENOKOT-S) 8.6-50 MG tablet Take 1 tablet by mouth 2 (two) times daily.   tirzepatide (ZEPBOUND) 2.5 MG/0.5ML Pen Inject 2.5 mg into the skin once a week.   traMADol  (ULTRAM ) 50 MG tablet Take 1-2 tablets (50-100 mg total) by mouth every 6 (six) hours as needed for moderate pain (pain score 4-6).   valsartan (DIOVAN) 160 MG tablet Take 160 mg by mouth daily.   No facility-administered encounter medications on file as of 02/26/2024.     Allergies: Allergies[1]  Family History: Family History  Problem Relation Age of Onset   Heart disease Mother    Hypertension Mother    Colon polyps Sister    Colon cancer Maternal Uncle    Colon cancer Maternal Grandfather    Esophageal cancer Neg Hx    Pancreatic cancer Neg Hx    Liver disease Neg Hx    Stomach cancer Neg Hx     Social History: Social History[2] Social History   Social History Narrative   Are you right handed or left handed? Right handed   Are you currently employed ? no   What is your current occupation? retired   Do you live at home alone? No    Who lives with you? Husband    What type of home do you live in: 1 story or 2 story? Lives in a one story home        Vital Signs:  BP 115/75   Pulse 95   Ht 5'  4 (1.626 m)   Wt 182 lb (82.6 kg)   SpO2 98%   BMI 31.24 kg/m    Neurological Exam: MENTAL STATUS including orientation to time, place, person, recent and remote memory, attention span and concentration, language, and fund of knowledge is normal.  Speech is not dysarthric.  CRANIAL NERVES: II:  No visual field defects.     III-IV-VI: Pupils equal round and reactive to light.  Normal conjugate, extra-ocular eye movements in all directions of gaze.  No nystagmus.  No ptosis.   V:  Normal facial sensation.    VII:  Normal facial symmetry and movements.   VIII:  Normal hearing and vestibular function.   IX-X:  Normal palatal movement.   XI:  Normal shoulder shrug and head rotation.   XII:  Normal tongue strength and range of motion, no deviation or fasciculation.  MOTOR:  Motor strength is 5/5 throughout.  No atrophy, fasciculations or abnormal movements.  No pronator drift. No asterixis.  MSRs:                                           Right        Left brachioradialis 2+  2+  biceps 2+  2+  triceps 2+  2+  patellar 2+  2+  ankle jerk 2+  2+  Hoffman no  no  plantar response down  down   SENSORY:  Normal and symmetric perception  of light touch, pinprick, vibration, and temperature.  Romberg's sign absent.   COORDINATION/GAIT: Normal finger-to- nose-finger.  Intact rapid alternating movements bilaterally.   Gait narrow based and stable. Tandem and stressed gait intact.    Thank you for allowing me to participate in patient's care.  If I can answer any additional questions, I would be pleased to do so.    Sincerely,    Mariel Lukins K. Halley Shepheard, DO     [1] No Known Allergies [2]  Social History Tobacco Use   Smoking status: Never   Smokeless tobacco: Never  Vaping Use   Vaping status: Never Used  Substance Use Topics   Alcohol use: No   Drug use: No   "

## 2024-02-26 NOTE — Patient Instructions (Signed)
 Check labs

## 2024-02-27 ENCOUNTER — Ambulatory Visit: Payer: Self-pay | Admitting: Neurology

## 2024-02-27 LAB — COMPREHENSIVE METABOLIC PANEL WITH GFR
AG Ratio: 1.5 (calc) (ref 1.0–2.5)
ALT: 16 U/L (ref 6–29)
AST: 16 U/L (ref 10–35)
Albumin: 4.1 g/dL (ref 3.6–5.1)
Alkaline phosphatase (APISO): 105 U/L (ref 37–153)
BUN/Creatinine Ratio: 19 (calc) (ref 6–22)
BUN: 22 mg/dL (ref 7–25)
CO2: 29 mmol/L (ref 20–32)
Calcium: 9.3 mg/dL (ref 8.6–10.4)
Chloride: 103 mmol/L (ref 98–110)
Creat: 1.17 mg/dL — ABNORMAL HIGH (ref 0.60–1.00)
Globulin: 2.8 g/dL (ref 1.9–3.7)
Glucose, Bld: 87 mg/dL (ref 65–99)
Potassium: 4.2 mmol/L (ref 3.5–5.3)
Sodium: 140 mmol/L (ref 135–146)
Total Bilirubin: 0.4 mg/dL (ref 0.2–1.2)
Total Protein: 6.9 g/dL (ref 6.1–8.1)
eGFR: 49 mL/min/1.73m2 — ABNORMAL LOW

## 2024-02-27 LAB — VITAMIN B12: Vitamin B-12: 611 pg/mL (ref 200–1100)

## 2024-02-27 LAB — TSH: TSH: 1.22 m[IU]/L (ref 0.40–4.50)

## 2024-02-27 LAB — MAGNESIUM: Magnesium: 2.1 mg/dL (ref 1.5–2.5)

## 2024-02-29 MED ORDER — LEVETIRACETAM 250 MG PO TABS: 250.0000 mg | ORAL_TABLET | Freq: Two times a day (BID) | ORAL | 5 refills | Status: AC

## 2024-03-03 ENCOUNTER — Ambulatory Visit: Admitting: Neurology

## 2024-03-06 ENCOUNTER — Telehealth: Payer: Self-pay | Admitting: *Deleted

## 2024-03-06 NOTE — Telephone Encounter (Signed)
-----   Message from Wilbert Bihari, MD sent at 02/17/2024  9:25 PM EST ----- Please let patient know that sleep study showed no significant sleep apnea.

## 2024-03-06 NOTE — Telephone Encounter (Signed)
 Patient notified of result via her mychart.

## 2024-03-18 ENCOUNTER — Encounter (HOSPITAL_BASED_OUTPATIENT_CLINIC_OR_DEPARTMENT_OTHER): Payer: Self-pay | Admitting: Orthopedic Surgery

## 2024-03-18 ENCOUNTER — Other Ambulatory Visit: Payer: Self-pay

## 2024-03-18 NOTE — Progress Notes (Signed)
" °   03/18/24 1443  PAT Phone Screen  Is the patient taking a GLP-1 receptor agonist? (S)  Yes (LD 03/10/2024)  Has the patient been informed on holding medication? Yes  Do You Have Diabetes? No  Do You Have Hypertension? (S)  Yes  Have You Ever Been to the ER for Asthma? No  Have You Taken Oral Steroids in the Past 3 Months? No  Do you Take Phenteramine or any Other Diet Drugs? No  Recent  Lab Work, EKG, CXR? (S)  Yes (EKG, CMP)  Where was this test performed? [facility]  Do you have a history of heart problems? (S)  Yes (heart palpitations)  Cardiologist Name (S)  Randine Bihari, MD  Have you ever had tests on your heart? Yes  What cardiac tests were performed? Echo;EKG  What date/year were cardiac tests completed? (S)  Echo 2021 EF 60-65%, CMP 02/26/2024, EKG 12/18/2023  Results viewable: [facility] Media Tab;Care Everywhere  Any Recent Hospitalizations? No  Height 5' 4 (1.626 m)  Weight 77.1 kg  Pat Appointment Scheduled No  Reason for No Appointment Not Needed    "

## 2024-03-26 ENCOUNTER — Encounter (HOSPITAL_BASED_OUTPATIENT_CLINIC_OR_DEPARTMENT_OTHER): Payer: Self-pay

## 2024-03-26 ENCOUNTER — Ambulatory Visit (HOSPITAL_BASED_OUTPATIENT_CLINIC_OR_DEPARTMENT_OTHER): Admit: 2024-03-26 | Admitting: Orthopedic Surgery

## 2024-03-26 SURGERY — CARPAL TUNNEL RELEASE
Anesthesia: Monitor Anesthesia Care | Laterality: Right
